# Patient Record
Sex: Female | Born: 1970 | Hispanic: No | Marital: Married | Smoking: Never smoker
Health system: Southern US, Community
[De-identification: ages and names within clinical notes are randomized; demographics above are authoritative.]

## PROBLEM LIST (undated history)

## (undated) DIAGNOSIS — S14129A Central cord syndrome at unspecified level of cervical spinal cord, initial encounter: Secondary | ICD-10-CM

## (undated) DIAGNOSIS — Z90710 Acquired absence of both cervix and uterus: Secondary | ICD-10-CM

## (undated) HISTORY — PX: ABDOMINAL HYSTERECTOMY: SHX81

## (undated) HISTORY — DX: Acquired absence of both cervix and uterus: Z90.710

## (undated) HISTORY — PX: TOTAL ABDOMINAL HYSTERECTOMY: SHX209

## (undated) HISTORY — DX: Central cord syndrome at unspecified level of cervical spinal cord, initial encounter: S14.129A

---

## 2000-11-19 ENCOUNTER — Emergency Department (HOSPITAL_COMMUNITY): Admission: EM | Admit: 2000-11-19 | Discharge: 2000-11-19 | Payer: Self-pay | Admitting: Emergency Medicine

## 2000-11-19 ENCOUNTER — Encounter: Payer: Self-pay | Admitting: Emergency Medicine

## 2003-03-13 ENCOUNTER — Emergency Department (HOSPITAL_COMMUNITY): Admission: AD | Admit: 2003-03-13 | Discharge: 2003-03-13 | Payer: Self-pay | Admitting: Family Medicine

## 2006-10-04 ENCOUNTER — Emergency Department (HOSPITAL_COMMUNITY): Admission: EM | Admit: 2006-10-04 | Discharge: 2006-10-04 | Payer: Self-pay | Admitting: Emergency Medicine

## 2010-12-15 LAB — URINALYSIS, ROUTINE W REFLEX MICROSCOPIC
Bilirubin Urine: NEGATIVE
Glucose, UA: NEGATIVE
Ketones, ur: NEGATIVE
Leukocytes, UA: NEGATIVE
Nitrite: NEGATIVE
Protein, ur: NEGATIVE
Specific Gravity, Urine: <1.005 — ABNORMAL LOW
Urobilinogen, UA: 0.2
pH: 6

## 2010-12-15 LAB — DIFFERENTIAL
Basophils Absolute: 0
Basophils Relative: 0
Eosinophils Absolute: 0.1
Eosinophils Relative: 2
Lymphocytes Relative: 32
Lymphs Abs: 2.1
Monocytes Absolute: 0.6
Monocytes Relative: 9
Neutro Abs: 3.8
Neutrophils Relative %: 57

## 2010-12-15 LAB — PREGNANCY, URINE: Preg Test, Ur: NEGATIVE

## 2010-12-15 LAB — URINE MICROSCOPIC-ADD ON

## 2010-12-15 LAB — CBC
HCT: 34 — ABNORMAL LOW
Hemoglobin: 12.1
MCHC: 35.7
MCV: 90.6
Platelets: 250
RBC: 3.75 — ABNORMAL LOW
RDW: 12.3
WBC: 6.7

## 2010-12-15 LAB — BASIC METABOLIC PANEL
BUN: 6
CO2: 25
Calcium: 9.2
Chloride: 106
Creatinine, Ser: 0.59
GFR calc Af Amer: >60
GFR calc non Af Amer: >60
Glucose, Bld: 95
Potassium: 3.6
Sodium: 138

## 2019-03-03 HISTORY — PX: OTHER SURGICAL HISTORY: SHX169

## 2019-08-03 ENCOUNTER — Emergency Department (HOSPITAL_COMMUNITY): Payer: Medicaid Other

## 2019-08-03 ENCOUNTER — Inpatient Hospital Stay (HOSPITAL_COMMUNITY)
Admission: EM | Admit: 2019-08-03 | Discharge: 2019-08-07 | DRG: 957 | Disposition: A | Discharge status: Rehabilitation Facility | Payer: Medicaid Other | Attending: General Surgery | Admitting: General Surgery

## 2019-08-03 ENCOUNTER — Inpatient Hospital Stay (HOSPITAL_COMMUNITY): Payer: Medicaid Other

## 2019-08-03 ENCOUNTER — Encounter (HOSPITAL_COMMUNITY): Payer: Self-pay

## 2019-08-03 DIAGNOSIS — S0102XA Laceration with foreign body of scalp, initial encounter: Secondary | ICD-10-CM | POA: Diagnosis present

## 2019-08-03 DIAGNOSIS — D696 Thrombocytopenia, unspecified: Secondary | ICD-10-CM | POA: Diagnosis present

## 2019-08-03 DIAGNOSIS — S14129D Central cord syndrome at unspecified level of cervical spinal cord, subsequent encounter: Secondary | ICD-10-CM | POA: Diagnosis not present

## 2019-08-03 DIAGNOSIS — S22079A Unspecified fracture of T9-T10 vertebra, initial encounter for closed fracture: Secondary | ICD-10-CM | POA: Diagnosis not present

## 2019-08-03 DIAGNOSIS — M542 Cervicalgia: Secondary | ICD-10-CM | POA: Diagnosis not present

## 2019-08-03 DIAGNOSIS — M79642 Pain in left hand: Secondary | ICD-10-CM | POA: Diagnosis not present

## 2019-08-03 DIAGNOSIS — Y92410 Unspecified street and highway as the place of occurrence of the external cause: Secondary | ICD-10-CM | POA: Diagnosis not present

## 2019-08-03 DIAGNOSIS — S271XXA Traumatic hemothorax, initial encounter: Secondary | ICD-10-CM | POA: Diagnosis not present

## 2019-08-03 DIAGNOSIS — S22009A Unspecified fracture of unspecified thoracic vertebra, initial encounter for closed fracture: Secondary | ICD-10-CM | POA: Diagnosis present

## 2019-08-03 DIAGNOSIS — M4317 Spondylolisthesis, lumbosacral region: Secondary | ICD-10-CM | POA: Diagnosis not present

## 2019-08-03 DIAGNOSIS — R578 Other shock: Secondary | ICD-10-CM | POA: Diagnosis not present

## 2019-08-03 DIAGNOSIS — M79601 Pain in right arm: Secondary | ICD-10-CM | POA: Diagnosis present

## 2019-08-03 DIAGNOSIS — Z833 Family history of diabetes mellitus: Secondary | ICD-10-CM | POA: Diagnosis not present

## 2019-08-03 DIAGNOSIS — S080XXA Avulsion of scalp, initial encounter: Secondary | ICD-10-CM | POA: Diagnosis not present

## 2019-08-03 DIAGNOSIS — K592 Neurogenic bowel, not elsewhere classified: Secondary | ICD-10-CM | POA: Diagnosis not present

## 2019-08-03 DIAGNOSIS — M79602 Pain in left arm: Secondary | ICD-10-CM | POA: Diagnosis present

## 2019-08-03 DIAGNOSIS — S14129S Central cord syndrome at unspecified level of cervical spinal cord, sequela: Secondary | ICD-10-CM | POA: Diagnosis not present

## 2019-08-03 DIAGNOSIS — R52 Pain, unspecified: Secondary | ICD-10-CM | POA: Diagnosis not present

## 2019-08-03 DIAGNOSIS — S14129A Central cord syndrome at unspecified level of cervical spinal cord, initial encounter: Secondary | ICD-10-CM | POA: Diagnosis not present

## 2019-08-03 DIAGNOSIS — D62 Acute posthemorrhagic anemia: Secondary | ICD-10-CM | POA: Diagnosis not present

## 2019-08-03 DIAGNOSIS — B373 Candidiasis of vulva and vagina: Secondary | ICD-10-CM | POA: Diagnosis not present

## 2019-08-03 DIAGNOSIS — S22039A Unspecified fracture of third thoracic vertebra, initial encounter for closed fracture: Principal | ICD-10-CM | POA: Diagnosis present

## 2019-08-03 DIAGNOSIS — R339 Retention of urine, unspecified: Secondary | ICD-10-CM | POA: Diagnosis present

## 2019-08-03 DIAGNOSIS — S22030D Wedge compression fracture of third thoracic vertebra, subsequent encounter for fracture with routine healing: Secondary | ICD-10-CM | POA: Diagnosis present

## 2019-08-03 DIAGNOSIS — R40242 Glasgow coma scale score 9-12, unspecified time: Secondary | ICD-10-CM | POA: Diagnosis present

## 2019-08-03 DIAGNOSIS — S134XXA Sprain of ligaments of cervical spine, initial encounter: Secondary | ICD-10-CM | POA: Diagnosis present

## 2019-08-03 DIAGNOSIS — I959 Hypotension, unspecified: Secondary | ICD-10-CM | POA: Diagnosis not present

## 2019-08-03 DIAGNOSIS — M25539 Pain in unspecified wrist: Secondary | ICD-10-CM

## 2019-08-03 DIAGNOSIS — S2220XA Unspecified fracture of sternum, initial encounter for closed fracture: Secondary | ICD-10-CM | POA: Diagnosis not present

## 2019-08-03 DIAGNOSIS — J9690 Respiratory failure, unspecified, unspecified whether with hypoxia or hypercapnia: Secondary | ICD-10-CM

## 2019-08-03 DIAGNOSIS — S27322A Contusion of lung, bilateral, initial encounter: Secondary | ICD-10-CM | POA: Diagnosis present

## 2019-08-03 DIAGNOSIS — S1980XA Other specified injuries of unspecified part of neck, initial encounter: Secondary | ICD-10-CM

## 2019-08-03 DIAGNOSIS — E876 Hypokalemia: Secondary | ICD-10-CM | POA: Diagnosis not present

## 2019-08-03 DIAGNOSIS — Z20822 Contact with and (suspected) exposure to covid-19: Secondary | ICD-10-CM | POA: Diagnosis not present

## 2019-08-03 DIAGNOSIS — Z23 Encounter for immunization: Secondary | ICD-10-CM

## 2019-08-03 DIAGNOSIS — N319 Neuromuscular dysfunction of bladder, unspecified: Secondary | ICD-10-CM | POA: Diagnosis not present

## 2019-08-03 DIAGNOSIS — S272XXA Traumatic hemopneumothorax, initial encounter: Secondary | ICD-10-CM | POA: Diagnosis present

## 2019-08-03 DIAGNOSIS — S020XXA Fracture of vault of skull, initial encounter for closed fracture: Secondary | ICD-10-CM | POA: Diagnosis not present

## 2019-08-03 DIAGNOSIS — R2689 Other abnormalities of gait and mobility: Secondary | ICD-10-CM | POA: Diagnosis not present

## 2019-08-03 DIAGNOSIS — S0101XS Laceration without foreign body of scalp, sequela: Secondary | ICD-10-CM | POA: Diagnosis not present

## 2019-08-03 DIAGNOSIS — R404 Transient alteration of awareness: Secondary | ICD-10-CM | POA: Diagnosis not present

## 2019-08-03 LAB — URINALYSIS, ROUTINE W REFLEX MICROSCOPIC
Bacteria, UA: NONE SEEN
Bilirubin Urine: NEGATIVE
Glucose, UA: NEGATIVE mg/dL
Ketones, ur: NEGATIVE mg/dL
Leukocytes,Ua: NEGATIVE
Nitrite: NEGATIVE
Protein, ur: 30 mg/dL — AB
Specific Gravity, Urine: 1.039 — ABNORMAL HIGH (ref 1.005–1.030)
pH: 6 (ref 5.0–8.0)

## 2019-08-03 LAB — COMPREHENSIVE METABOLIC PANEL
ALT: 151 U/L — ABNORMAL HIGH (ref 0–44)
AST: 183 U/L — ABNORMAL HIGH (ref 15–41)
Albumin: 3.8 g/dL (ref 3.5–5.0)
Alkaline Phosphatase: 62 U/L (ref 38–126)
Anion gap: 12 (ref 5–15)
BUN: 13 mg/dL (ref 6–20)
CO2: 22 mmol/L (ref 22–32)
Calcium: 9 mg/dL (ref 8.9–10.3)
Chloride: 104 mmol/L (ref 98–111)
Creatinine, Ser: 1.03 mg/dL — ABNORMAL HIGH (ref 0.44–1.00)
GFR calc Af Amer: >60 mL/min (ref >60)
GFR calc non Af Amer: >60 mL/min (ref >60)
Glucose, Bld: 168 mg/dL — ABNORMAL HIGH (ref 70–99)
Potassium: 3.6 mmol/L (ref 3.5–5.1)
Sodium: 138 mmol/L (ref 135–145)
Total Bilirubin: 0.9 mg/dL (ref 0.3–1.2)
Total Protein: 6.3 g/dL — ABNORMAL LOW (ref 6.5–8.1)

## 2019-08-03 LAB — CBC
HCT: 45.2 % (ref 36.0–46.0)
Hemoglobin: 15.3 g/dL — ABNORMAL HIGH (ref 12.0–15.0)
MCH: 31.2 pg (ref 26.0–34.0)
MCHC: 33.8 g/dL (ref 30.0–36.0)
MCV: 92.2 fL (ref 80.0–100.0)
Platelets: 241 10*3/uL (ref 150–400)
RBC: 4.9 MIL/uL (ref 3.87–5.11)
RDW: 12.3 % (ref 11.5–15.5)
WBC: 17 10*3/uL — ABNORMAL HIGH (ref 4.0–10.5)
nRBC: 0 % (ref 0.0–0.2)

## 2019-08-03 LAB — PREPARE FRESH FROZEN PLASMA
Unit division: 0
Unit division: 0

## 2019-08-03 LAB — I-STAT CHEM 8, ED
BUN: 14 mg/dL (ref 6–20)
Calcium, Ion: 1.01 mmol/L — ABNORMAL LOW (ref 1.15–1.40)
Chloride: 104 mmol/L (ref 98–111)
Creatinine, Ser: 1.1 mg/dL — ABNORMAL HIGH (ref 0.44–1.00)
Glucose, Bld: 170 mg/dL — ABNORMAL HIGH (ref 70–99)
HCT: 44 % (ref 36.0–46.0)
Hemoglobin: 15 g/dL (ref 12.0–15.0)
Potassium: 3.3 mmol/L — ABNORMAL LOW (ref 3.5–5.1)
Sodium: 139 mmol/L (ref 135–145)
TCO2: 24 mmol/L (ref 22–32)

## 2019-08-03 LAB — RAPID URINE DRUG SCREEN, HOSP PERFORMED
Amphetamines: NOT DETECTED
Barbiturates: NOT DETECTED
Benzodiazepines: NOT DETECTED
Cocaine: NOT DETECTED
Opiates: NOT DETECTED
Tetrahydrocannabinol: NOT DETECTED

## 2019-08-03 LAB — HIV ANTIBODY (ROUTINE TESTING W REFLEX): HIV Screen 4th Generation wRfx: NONREACTIVE

## 2019-08-03 LAB — ABO/RH: ABO/RH(D): B POS

## 2019-08-03 LAB — LACTIC ACID, PLASMA: Lactic Acid, Venous: 3.6 mmol/L (ref 0.5–1.9)

## 2019-08-03 LAB — TRAUMA TEG PANEL
CFF Max Amplitude: 18.5 mm (ref 15–32)
Citrated Kaolin (R): 3.4 min — ABNORMAL LOW (ref 4.6–9.1)
Citrated Rapid TEG (MA): 59.9 mm (ref 52–70)
Lysis at 30 Minutes: 0 % (ref 0.0–2.6)

## 2019-08-03 LAB — PROTIME-INR
INR: 1.1 (ref 0.8–1.2)
Prothrombin Time: 13.9 seconds (ref 11.4–15.2)

## 2019-08-03 LAB — BPAM FFP
Blood Product Expiration Date: 202106042359
Blood Product Expiration Date: 202106042359
ISSUE DATE / TIME: 202106030610
ISSUE DATE / TIME: 202106030610
Unit Type and Rh: 2800
Unit Type and Rh: 8400

## 2019-08-03 LAB — SARS CORONAVIRUS 2 BY RT PCR (HOSPITAL ORDER, PERFORMED IN ~~LOC~~ HOSPITAL LAB): SARS Coronavirus 2: NEGATIVE

## 2019-08-03 LAB — ETHANOL: Alcohol, Ethyl (B): <10 mg/dL (ref <10)

## 2019-08-03 LAB — MRSA PCR SCREENING: MRSA by PCR: NEGATIVE

## 2019-08-03 MED ORDER — IOHEXOL 300 MG/ML  SOLN
100.0000 mL | Freq: Once | INTRAMUSCULAR | Status: AC | PRN
  Administered 2019-08-03: 100 mL via INTRAVENOUS

## 2019-08-03 MED ORDER — OXYCODONE HCL 5 MG PO TABS
5.0000 mg | ORAL_TABLET | ORAL | Status: DC | PRN
  Administered 2019-08-03 – 2019-08-07 (×12): 10 mg via ORAL
  Filled 2019-08-03 (×13): qty 2

## 2019-08-03 MED ORDER — DOCUSATE SODIUM 100 MG PO CAPS
100.0000 mg | ORAL_CAPSULE | Freq: Two times a day (BID) | ORAL | Status: DC
  Administered 2019-08-03 – 2019-08-07 (×7): 100 mg via ORAL
  Filled 2019-08-03 (×7): qty 1

## 2019-08-03 MED ORDER — MORPHINE SULFATE (PF) 4 MG/ML IV SOLN: 4.0000 mg | INTRAVENOUS | Status: DC | PRN

## 2019-08-03 MED ORDER — FENTANYL CITRATE (PF) 100 MCG/2ML IJ SOLN
INTRAMUSCULAR | Status: AC | PRN
  Administered 2019-08-03: 25 ug via INTRAVENOUS

## 2019-08-03 MED ORDER — FENTANYL CITRATE (PF) 100 MCG/2ML IJ SOLN
INTRAMUSCULAR | Status: AC
  Filled 2019-08-03: qty 2

## 2019-08-03 MED ORDER — ONDANSETRON HCL 4 MG/2ML IJ SOLN: 4.0000 mg | Freq: Four times a day (QID) | INTRAMUSCULAR | Status: DC | PRN

## 2019-08-03 MED ORDER — LIDOCAINE-EPINEPHRINE (PF) 2 %-1:200000 IJ SOLN
INTRAMUSCULAR | Status: AC
  Filled 2019-08-03: qty 20

## 2019-08-03 MED ORDER — CEFAZOLIN SODIUM-DEXTROSE 2-4 GM/100ML-% IV SOLN
2.0000 g | Freq: Once | INTRAVENOUS | Status: AC
  Administered 2019-08-03: 2 g via INTRAVENOUS
  Filled 2019-08-03: qty 100

## 2019-08-03 MED ORDER — FENTANYL CITRATE (PF) 100 MCG/2ML IJ SOLN
50.0000 ug | Freq: Once | INTRAMUSCULAR | Status: AC
  Administered 2019-08-03: 50 ug via INTRAVENOUS

## 2019-08-03 MED ORDER — SODIUM CHLORIDE 0.9 % IV BOLUS
1000.0000 mL | Freq: Once | INTRAVENOUS | Status: AC
  Administered 2019-08-03: 1000 mL via INTRAVENOUS

## 2019-08-03 MED ORDER — ENOXAPARIN SODIUM 30 MG/0.3ML ~~LOC~~ SOLN
30.0000 mg | Freq: Two times a day (BID) | SUBCUTANEOUS | Status: DC
  Administered 2019-08-03 – 2019-08-07 (×8): 30 mg via SUBCUTANEOUS
  Filled 2019-08-03 (×8): qty 0.3

## 2019-08-03 MED ORDER — ACETAMINOPHEN 500 MG PO TABS
1000.0000 mg | ORAL_TABLET | Freq: Four times a day (QID) | ORAL | Status: DC
  Administered 2019-08-03 – 2019-08-07 (×14): 1000 mg via ORAL
  Filled 2019-08-03 (×15): qty 2

## 2019-08-03 MED ORDER — IOHEXOL 300 MG/ML  SOLN: 100.0000 mL | Freq: Once | INTRAMUSCULAR | Status: DC | PRN

## 2019-08-03 MED ORDER — FENTANYL CITRATE (PF) 100 MCG/2ML IJ SOLN
INTRAMUSCULAR | Status: AC
  Administered 2019-08-03: 25 ug via INTRAVENOUS
  Filled 2019-08-03: qty 2

## 2019-08-03 MED ORDER — LACTATED RINGERS IV SOLN: INTRAVENOUS | Status: DC

## 2019-08-03 MED ORDER — FENTANYL CITRATE (PF) 100 MCG/2ML IJ SOLN: 25.0000 ug | INTRAMUSCULAR | Status: DC | PRN

## 2019-08-03 MED ORDER — ONDANSETRON 4 MG PO TBDP: 4.0000 mg | ORAL_TABLET | Freq: Four times a day (QID) | ORAL | Status: DC | PRN

## 2019-08-03 MED ORDER — TETANUS-DIPHTH-ACELL PERTUSSIS 5-2.5-18.5 LF-MCG/0.5 IM SUSP
0.5000 mL | Freq: Once | INTRAMUSCULAR | Status: AC
  Administered 2019-08-03: 0.5 mL via INTRAMUSCULAR
  Filled 2019-08-03: qty 0.5

## 2019-08-03 NOTE — Progress Notes (Signed)
Orthopedic Tech Progress Note Patient Details:  Shelly Houston Jan 12, 1971 [id number]  Called [employer] and ordered medium SOMI brace.  Gerald Stabs 08/03/2019, 2:23 PM
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
## 2019-08-03 NOTE — ED Notes (Signed)
Transported to xray 
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
## 2019-08-03 NOTE — Progress Notes (Signed)
CT c-spine reviewed and negative for fracture. Clinical exam performed to evaluate for ligamentous injury. C-spine evaluation performed. No midline pain, but +TTP. Exam aborted and flex-ex films performed notable for widening at C4/5. MRI c-spine ordered.   Diamantina Monks, MD General and Trauma Surgery [facility]
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
## 2019-08-03 NOTE — Progress Notes (Signed)
Orthopedic Tech Progress Note Patient Details:  Shelly Houston October 13, 1970 [id number]  Ortho Devices Type of Ortho Device: Volar splint Ortho Device/Splint Location: RUE Ortho Device/Splint Interventions: Application   Post Interventions Patient Tolerated: Well   Fletcher Rathbun E Edynn Gillock 08/03/2019, 7:11 AM

## 2019-08-04 ENCOUNTER — Inpatient Hospital Stay (HOSPITAL_COMMUNITY): Payer: Medicaid Other

## 2019-08-04 LAB — BASIC METABOLIC PANEL
Anion gap: 7 (ref 5–15)
BUN: 21 mg/dL — ABNORMAL HIGH (ref 6–20)
CO2: 25 mmol/L (ref 22–32)
Calcium: 8.5 mg/dL — ABNORMAL LOW (ref 8.9–10.3)
Chloride: 109 mmol/L (ref 98–111)
Creatinine, Ser: 0.72 mg/dL (ref 0.44–1.00)
GFR calc Af Amer: >60 mL/min (ref >60)
GFR calc non Af Amer: >60 mL/min (ref >60)
Glucose, Bld: 122 mg/dL — ABNORMAL HIGH (ref 70–99)
Potassium: 3.5 mmol/L (ref 3.5–5.1)
Sodium: 141 mmol/L (ref 135–145)

## 2019-08-04 LAB — CBC
HCT: 34.6 % — ABNORMAL LOW (ref 36.0–46.0)
Hemoglobin: 12.3 g/dL (ref 12.0–15.0)
MCH: 31.3 pg (ref 26.0–34.0)
MCHC: 35.5 g/dL (ref 30.0–36.0)
MCV: 88 fL (ref 80.0–100.0)
Platelets: 162 10*3/uL (ref 150–400)
RBC: 3.93 MIL/uL (ref 3.87–5.11)
RDW: 13.3 % (ref 11.5–15.5)
WBC: 11.4 10*3/uL — ABNORMAL HIGH (ref 4.0–10.5)
nRBC: 0 % (ref 0.0–0.2)

## 2019-08-04 LAB — TYPE AND SCREEN
ABO/RH(D): B POS
Antibody Screen: NEGATIVE
Unit division: 0
Unit division: 0
Unit division: 0
Unit division: 0

## 2019-08-04 LAB — BPAM RBC
Blood Product Expiration Date: 202106192359
Blood Product Expiration Date: 202106192359
Blood Product Expiration Date: 202107082359
Blood Product Expiration Date: 202107082359
ISSUE DATE / TIME: 202106030605
ISSUE DATE / TIME: 202106030608
ISSUE DATE / TIME: 202106030608
ISSUE DATE / TIME: 202106030615
Unit Type and Rh: 5100
Unit Type and Rh: 5100
Unit Type and Rh: 9500
Unit Type and Rh: 9500

## 2019-08-04 LAB — BLOOD PRODUCT ORDER (VERBAL) VERIFICATION

## 2019-08-04 MED ORDER — SODIUM CHLORIDE 0.9 % IV BOLUS
500.0000 mL | Freq: Once | INTRAVENOUS | Status: AC
  Administered 2019-08-04: 500 mL via INTRAVENOUS

## 2019-08-04 MED ORDER — LACTATED RINGERS IV BOLUS
1000.0000 mL | Freq: Once | INTRAVENOUS | Status: AC
  Administered 2019-08-04: 1000 mL via INTRAVENOUS

## 2019-08-04 MED ORDER — KETOROLAC TROMETHAMINE 15 MG/ML IJ SOLN
30.0000 mg | Freq: Four times a day (QID) | INTRAMUSCULAR | Status: DC
  Administered 2019-08-04 – 2019-08-07 (×13): 30 mg via INTRAVENOUS
  Filled 2019-08-04 (×14): qty 2

## 2019-08-04 MED ORDER — CHLORHEXIDINE GLUCONATE CLOTH 2 % EX PADS
6.0000 | MEDICATED_PAD | Freq: Every day | CUTANEOUS | Status: DC
  Administered 2019-08-04 – 2019-08-07 (×4): 6 via TOPICAL

## 2019-08-04 MED ORDER — METHOCARBAMOL 500 MG PO TABS
1000.0000 mg | ORAL_TABLET | Freq: Three times a day (TID) | ORAL | Status: DC
  Administered 2019-08-04 – 2019-08-07 (×10): 1000 mg via ORAL
  Filled 2019-08-04 (×10): qty 2
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
## 2019-08-04 NOTE — TOC CAGE-AID Note (Signed)
Transition of Care [facility]) - CAGE-AID Screening   Patient Details  Name: Shelly Houston MRN: [id number] Date of Birth: 1970-04-03  [facility] CM/SW Contact:    Jimmy Picket, LCSWA Phone Number: 08/04/2019, 11:28 AM   Clinical Narrative:  Pt was unable to participate in assessment due to not being oriented.   CAGE-AID Screening: Substance Abuse Screening unable to be completed due to: : Patient unable to participate                    Isabella Stalling Clinical Social Worker [phone]

## 2019-08-04 NOTE — Progress Notes (Signed)
Bosie Clos is daughter and Erie Noe is the niece. Provider on call gave orders to to flush foley and if little return then bladder scan. Flushed with 20 ml and 20 ml came out. Bladder scan showed 0. MD placed orders to give bolus. Will continue to monitor. Mayford Knife RN
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
## 2019-08-04 NOTE — Progress Notes (Signed)
Orthopedic Tech Progress Note Patient Details:  Shelly Houston June 04, 1970 [id number] PER PA he wanted a VELCRO THUMB SPICA  Ortho Devices Type of Ortho Device: Thumb velcro splint Ortho Device/Splint Location: LUE Ortho Device/Splint Interventions: Ordered, Application   Post Interventions Patient Tolerated: Well Instructions Provided: Care of device   Shelly Houston 08/04/2019, 10:51 AM

## 2019-08-05 ENCOUNTER — Inpatient Hospital Stay (HOSPITAL_COMMUNITY): Payer: Medicaid Other

## 2019-08-05 LAB — URINALYSIS, COMPLETE (UACMP) WITH MICROSCOPIC
Bacteria, UA: NONE SEEN
Glucose, UA: NEGATIVE mg/dL
Hgb urine dipstick: NEGATIVE
Ketones, ur: NEGATIVE mg/dL
Leukocytes,Ua: NEGATIVE
Nitrite: NEGATIVE
Protein, ur: 30 mg/dL — AB
Specific Gravity, Urine: 1.029 (ref 1.005–1.030)
pH: 6 (ref 5.0–8.0)

## 2019-08-05 LAB — CBC
HCT: 31.2 % — ABNORMAL LOW (ref 36.0–46.0)
Hemoglobin: 10.9 g/dL — ABNORMAL LOW (ref 12.0–15.0)
MCH: 31.1 pg (ref 26.0–34.0)
MCHC: 34.9 g/dL (ref 30.0–36.0)
MCV: 89.1 fL (ref 80.0–100.0)
Platelets: 159 10*3/uL (ref 150–400)
RBC: 3.5 MIL/uL — ABNORMAL LOW (ref 3.87–5.11)
RDW: 13.2 % (ref 11.5–15.5)
WBC: 9.8 10*3/uL (ref 4.0–10.5)
nRBC: 0 % (ref 0.0–0.2)

## 2019-08-05 LAB — CDS SEROLOGY

## 2019-08-05 LAB — BASIC METABOLIC PANEL
Anion gap: 8 (ref 5–15)
BUN: 14 mg/dL (ref 6–20)
CO2: 25 mmol/L (ref 22–32)
Calcium: 8.3 mg/dL — ABNORMAL LOW (ref 8.9–10.3)
Chloride: 107 mmol/L (ref 98–111)
Creatinine, Ser: 0.6 mg/dL (ref 0.44–1.00)
GFR calc Af Amer: >60 mL/min (ref >60)
GFR calc non Af Amer: >60 mL/min (ref >60)
Glucose, Bld: 103 mg/dL — ABNORMAL HIGH (ref 70–99)
Potassium: 3.4 mmol/L — ABNORMAL LOW (ref 3.5–5.1)
Sodium: 140 mmol/L (ref 135–145)

## 2019-08-05 MED ORDER — GABAPENTIN 100 MG PO CAPS
100.0000 mg | ORAL_CAPSULE | Freq: Three times a day (TID) | ORAL | Status: DC
  Administered 2019-08-05 – 2019-08-07 (×8): 100 mg via ORAL
  Filled 2019-08-05 (×8): qty 1

## 2019-08-05 NOTE — Progress Notes (Signed)
NEUROSURGERY PROGRESS NOTE  Doing well. Complains of appropriate back pain. Lying in bed with brace comfortably.    Temp:  [98.3 F (36.8 C)-101 F (38.3 C)] 99.6 F (37.6 C) (06/05 0717) Pulse Rate:  [80-96] 80 (06/05 0717) Resp:  [8-20] 11 (06/05 0717) BP: (120-142)/(59-76) 125/70 (06/05 0717) SpO2:  [94 %-98 %] 98 % (06/05 0717)  Plan: No new recommendations. Continue bracing in hopes that these fractures will heal with time.   Sherryl Manges, NP 08/05/2019 9:45 AM
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
## 2019-08-05 NOTE — Progress Notes (Signed)
Inpatient Rehab Admissions Coordinator Note:   Per PT/OT recommendations, pt was screened for CIR candidacy by Wolfgang Phoenix, MS, CCC-SLP.  At this time we are recommending an Inpatient Rehab consult.  AC will place consult order per protocol.  Please contact me with questions.    Wolfgang Phoenix, MS, CCC-SLP Admissions Coordinator [phone] 08/05/19 5:26 PM
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
## 2019-08-05 NOTE — Progress Notes (Signed)
Spoke with Ortho Tech in regards to the poor fitting brace the patient is wearing. Ortho Tech is contacting the manufacturer to have them come in to improve the fit for the patient.
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
## 2019-08-05 NOTE — Progress Notes (Signed)
Orthopedic Tech Progress Note Patient Details:  Shelly Houston 10/28/1970 [id number]  Called [employer] to have SOMI brace refitted. I was told by the nurse that the braces support does not seem effective and that it is causing brusing.   Gerald Stabs 08/05/2019, 12:12 PM

## 2019-08-06 LAB — CBC
HCT: 28.8 % — ABNORMAL LOW (ref 36.0–46.0)
Hemoglobin: 9.8 g/dL — ABNORMAL LOW (ref 12.0–15.0)
MCH: 30.4 pg (ref 26.0–34.0)
MCHC: 34 g/dL (ref 30.0–36.0)
MCV: 89.4 fL (ref 80.0–100.0)
Platelets: 125 10*3/uL — ABNORMAL LOW (ref 150–400)
RBC: 3.22 MIL/uL — ABNORMAL LOW (ref 3.87–5.11)
RDW: 12.9 % (ref 11.5–15.5)
WBC: 9.5 10*3/uL (ref 4.0–10.5)
nRBC: 0 % (ref 0.0–0.2)

## 2019-08-06 LAB — BASIC METABOLIC PANEL
Anion gap: 10 (ref 5–15)
BUN: 10 mg/dL (ref 6–20)
CO2: 24 mmol/L (ref 22–32)
Calcium: 8.1 mg/dL — ABNORMAL LOW (ref 8.9–10.3)
Chloride: 106 mmol/L (ref 98–111)
Creatinine, Ser: 0.54 mg/dL (ref 0.44–1.00)
GFR calc Af Amer: >60 mL/min (ref >60)
GFR calc non Af Amer: >60 mL/min (ref >60)
Glucose, Bld: 101 mg/dL — ABNORMAL HIGH (ref 70–99)
Potassium: 3.4 mmol/L — ABNORMAL LOW (ref 3.5–5.1)
Sodium: 140 mmol/L (ref 135–145)

## 2019-08-06 LAB — URINE CULTURE: Culture: NO GROWTH
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
## 2019-08-07 ENCOUNTER — Encounter (HOSPITAL_COMMUNITY): Payer: Self-pay

## 2019-08-07 ENCOUNTER — Inpatient Hospital Stay (HOSPITAL_COMMUNITY)
Admission: RE | Admit: 2019-08-07 | Discharge: 2019-08-29 | DRG: 092 | Disposition: A | Discharge status: Home / Self Care | Payer: Medicaid Other | Source: Intra-hospital | Attending: Physical Medicine and Rehabilitation | Admitting: Physical Medicine and Rehabilitation

## 2019-08-07 DIAGNOSIS — M7542 Impingement syndrome of left shoulder: Secondary | ICD-10-CM | POA: Diagnosis present

## 2019-08-07 DIAGNOSIS — S14129D Central cord syndrome at unspecified level of cervical spinal cord, subsequent encounter: Secondary | ICD-10-CM

## 2019-08-07 DIAGNOSIS — D62 Acute posthemorrhagic anemia: Secondary | ICD-10-CM | POA: Diagnosis present

## 2019-08-07 DIAGNOSIS — S0101XS Laceration without foreign body of scalp, sequela: Secondary | ICD-10-CM | POA: Diagnosis not present

## 2019-08-07 DIAGNOSIS — S27321D Contusion of lung, unilateral, subsequent encounter: Secondary | ICD-10-CM

## 2019-08-07 DIAGNOSIS — K5900 Constipation, unspecified: Secondary | ICD-10-CM | POA: Diagnosis present

## 2019-08-07 DIAGNOSIS — S2220XD Unspecified fracture of sternum, subsequent encounter for fracture with routine healing: Secondary | ICD-10-CM | POA: Diagnosis not present

## 2019-08-07 DIAGNOSIS — M549 Dorsalgia, unspecified: Secondary | ICD-10-CM | POA: Diagnosis present

## 2019-08-07 DIAGNOSIS — S22039D Unspecified fracture of third thoracic vertebra, subsequent encounter for fracture with routine healing: Secondary | ICD-10-CM | POA: Diagnosis not present

## 2019-08-07 DIAGNOSIS — M25531 Pain in right wrist: Secondary | ICD-10-CM | POA: Diagnosis not present

## 2019-08-07 DIAGNOSIS — K592 Neurogenic bowel, not elsewhere classified: Secondary | ICD-10-CM | POA: Diagnosis not present

## 2019-08-07 DIAGNOSIS — S0101XD Laceration without foreign body of scalp, subsequent encounter: Secondary | ICD-10-CM

## 2019-08-07 DIAGNOSIS — S62001D Unspecified fracture of navicular [scaphoid] bone of right wrist, subsequent encounter for fracture with routine healing: Secondary | ICD-10-CM | POA: Diagnosis not present

## 2019-08-07 DIAGNOSIS — G629 Polyneuropathy, unspecified: Secondary | ICD-10-CM

## 2019-08-07 DIAGNOSIS — L24A9 Irritant contact dermatitis due friction or contact with other specified body fluids: Secondary | ICD-10-CM

## 2019-08-07 DIAGNOSIS — R001 Bradycardia, unspecified: Secondary | ICD-10-CM | POA: Diagnosis not present

## 2019-08-07 DIAGNOSIS — N390 Urinary tract infection, site not specified: Secondary | ICD-10-CM | POA: Diagnosis not present

## 2019-08-07 DIAGNOSIS — R2689 Other abnormalities of gait and mobility: Secondary | ICD-10-CM | POA: Diagnosis present

## 2019-08-07 DIAGNOSIS — E876 Hypokalemia: Secondary | ICD-10-CM | POA: Diagnosis not present

## 2019-08-07 DIAGNOSIS — R4701 Aphasia: Secondary | ICD-10-CM | POA: Diagnosis present

## 2019-08-07 DIAGNOSIS — R42 Dizziness and giddiness: Secondary | ICD-10-CM | POA: Diagnosis not present

## 2019-08-07 DIAGNOSIS — B9620 Unspecified Escherichia coli [E. coli] as the cause of diseases classified elsewhere: Secondary | ICD-10-CM | POA: Diagnosis not present

## 2019-08-07 DIAGNOSIS — M7541 Impingement syndrome of right shoulder: Secondary | ICD-10-CM | POA: Diagnosis present

## 2019-08-07 DIAGNOSIS — D696 Thrombocytopenia, unspecified: Secondary | ICD-10-CM | POA: Diagnosis present

## 2019-08-07 DIAGNOSIS — S2231XD Fracture of one rib, right side, subsequent encounter for fracture with routine healing: Secondary | ICD-10-CM

## 2019-08-07 DIAGNOSIS — S22009A Unspecified fracture of unspecified thoracic vertebra, initial encounter for closed fracture: Secondary | ICD-10-CM | POA: Diagnosis present

## 2019-08-07 DIAGNOSIS — N319 Neuromuscular dysfunction of bladder, unspecified: Secondary | ICD-10-CM | POA: Diagnosis present

## 2019-08-07 DIAGNOSIS — S14129A Central cord syndrome at unspecified level of cervical spinal cord, initial encounter: Secondary | ICD-10-CM | POA: Diagnosis present

## 2019-08-07 DIAGNOSIS — S14126D Central cord syndrome at C6 level of cervical spinal cord, subsequent encounter: Secondary | ICD-10-CM

## 2019-08-07 DIAGNOSIS — S22030D Wedge compression fracture of third thoracic vertebra, subsequent encounter for fracture with routine healing: Secondary | ICD-10-CM | POA: Diagnosis present

## 2019-08-07 DIAGNOSIS — M7540 Impingement syndrome of unspecified shoulder: Secondary | ICD-10-CM

## 2019-08-07 DIAGNOSIS — S14129S Central cord syndrome at unspecified level of cervical spinal cord, sequela: Secondary | ICD-10-CM | POA: Diagnosis not present

## 2019-08-07 LAB — CBC
HCT: 28.4 % — ABNORMAL LOW (ref 36.0–46.0)
Hemoglobin: 9.9 g/dL — ABNORMAL LOW (ref 12.0–15.0)
MCH: 31.2 pg (ref 26.0–34.0)
MCHC: 34.9 g/dL (ref 30.0–36.0)
MCV: 89.6 fL (ref 80.0–100.0)
Platelets: 167 10*3/uL (ref 150–400)
RBC: 3.17 MIL/uL — ABNORMAL LOW (ref 3.87–5.11)
RDW: 12.8 % (ref 11.5–15.5)
WBC: 9 10*3/uL (ref 4.0–10.5)
nRBC: 0.3 % — ABNORMAL HIGH (ref 0.0–0.2)

## 2019-08-07 LAB — BASIC METABOLIC PANEL
Anion gap: 8 (ref 5–15)
BUN: 10 mg/dL (ref 6–20)
CO2: 24 mmol/L (ref 22–32)
Calcium: 8.1 mg/dL — ABNORMAL LOW (ref 8.9–10.3)
Chloride: 106 mmol/L (ref 98–111)
Creatinine, Ser: 0.5 mg/dL (ref 0.44–1.00)
GFR calc Af Amer: >60 mL/min (ref >60)
GFR calc non Af Amer: >60 mL/min (ref >60)
Glucose, Bld: 99 mg/dL (ref 70–99)
Potassium: 3.1 mmol/L — ABNORMAL LOW (ref 3.5–5.1)
Sodium: 138 mmol/L (ref 135–145)

## 2019-08-07 MED ORDER — OXYCODONE HCL 5 MG PO TABS
5.0000 mg | ORAL_TABLET | ORAL | Status: DC | PRN
  Administered 2019-08-07 – 2019-08-27 (×16): 10 mg via ORAL
  Filled 2019-08-07 (×18): qty 2

## 2019-08-07 MED ORDER — CHLORHEXIDINE GLUCONATE CLOTH 2 % EX PADS: 6.0000 | MEDICATED_PAD | Freq: Every day | CUTANEOUS | Status: DC

## 2019-08-07 MED ORDER — ACETAMINOPHEN 325 MG PO TABS
325.0000 mg | ORAL_TABLET | ORAL | Status: DC | PRN
  Administered 2019-08-16: 650 mg via ORAL
  Filled 2019-08-07 (×2): qty 2

## 2019-08-07 MED ORDER — BETHANECHOL CHLORIDE 25 MG PO TABS
25.0000 mg | ORAL_TABLET | Freq: Three times a day (TID) | ORAL | Status: DC
  Administered 2019-08-07 – 2019-08-08 (×2): 25 mg via ORAL
  Filled 2019-08-07 (×2): qty 1

## 2019-08-07 MED ORDER — BETHANECHOL CHLORIDE 25 MG PO TABS
25.0000 mg | ORAL_TABLET | Freq: Three times a day (TID) | ORAL | Status: DC
  Administered 2019-08-07: 25 mg via ORAL
  Filled 2019-08-07: qty 1

## 2019-08-07 MED ORDER — LIDOCAINE HCL URETHRAL/MUCOSAL 2 % EX GEL: CUTANEOUS | Status: DC | PRN

## 2019-08-07 MED ORDER — GABAPENTIN 100 MG PO CAPS
100.0000 mg | ORAL_CAPSULE | Freq: Three times a day (TID) | ORAL | Status: DC
  Administered 2019-08-07 – 2019-08-08 (×2): 100 mg via ORAL
  Filled 2019-08-07 (×3): qty 1

## 2019-08-07 MED ORDER — ENOXAPARIN SODIUM 30 MG/0.3ML ~~LOC~~ SOLN
30.0000 mg | Freq: Two times a day (BID) | SUBCUTANEOUS | Status: DC
  Administered 2019-08-07 – 2019-08-29 (×44): 30 mg via SUBCUTANEOUS
  Filled 2019-08-07 (×44): qty 0.3

## 2019-08-07 MED ORDER — DIPHENHYDRAMINE HCL 12.5 MG/5ML PO ELIX: 12.5000 mg | ORAL_SOLUTION | Freq: Four times a day (QID) | ORAL | Status: DC | PRN

## 2019-08-07 MED ORDER — ARTIFICIAL TEARS OPHTHALMIC OINT
TOPICAL_OINTMENT | OPHTHALMIC | Status: DC | PRN
  Filled 2019-08-07: qty 3.5

## 2019-08-07 MED ORDER — PROCHLORPERAZINE EDISYLATE 10 MG/2ML IJ SOLN: 5.0000 mg | Freq: Four times a day (QID) | INTRAMUSCULAR | Status: DC | PRN

## 2019-08-07 MED ORDER — ENOXAPARIN SODIUM 30 MG/0.3ML ~~LOC~~ SOLN: 30.0000 mg | Freq: Two times a day (BID) | SUBCUTANEOUS | Status: DC

## 2019-08-07 MED ORDER — KETOROLAC TROMETHAMINE 15 MG/ML IJ SOLN
30.0000 mg | Freq: Four times a day (QID) | INTRAMUSCULAR | Status: DC
  Administered 2019-08-08 – 2019-08-09 (×6): 30 mg via INTRAVENOUS
  Filled 2019-08-07 (×7): qty 2

## 2019-08-07 MED ORDER — PROCHLORPERAZINE 25 MG RE SUPP
12.5000 mg | Freq: Four times a day (QID) | RECTAL | Status: DC | PRN
Start: 2019-08-07 — End: 2019-08-29

## 2019-08-07 MED ORDER — ACETAMINOPHEN 500 MG PO TABS
1000.0000 mg | ORAL_TABLET | Freq: Four times a day (QID) | ORAL | Status: DC
  Administered 2019-08-08 (×3): 1000 mg via ORAL
  Filled 2019-08-07 (×3): qty 2

## 2019-08-07 MED ORDER — METHOCARBAMOL 500 MG PO TABS
1000.0000 mg | ORAL_TABLET | Freq: Three times a day (TID) | ORAL | Status: DC
  Administered 2019-08-07 – 2019-08-09 (×5): 1000 mg via ORAL
  Filled 2019-08-07 (×5): qty 2

## 2019-08-07 MED ORDER — POLYETHYLENE GLYCOL 3350 17 G PO PACK
17.0000 g | PACK | Freq: Every day | ORAL | Status: DC | PRN
Start: 2019-08-07 — End: 2019-08-08

## 2019-08-07 MED ORDER — ALUM & MAG HYDROXIDE-SIMETH 200-200-20 MG/5ML PO SUSP: 30.0000 mL | ORAL | Status: DC | PRN

## 2019-08-07 MED ORDER — TRAMADOL HCL 50 MG PO TABS: 50.0000 mg | ORAL_TABLET | Freq: Four times a day (QID) | ORAL | Status: DC | PRN

## 2019-08-07 MED ORDER — TRAZODONE HCL 50 MG PO TABS
25.0000 mg | ORAL_TABLET | Freq: Every evening | ORAL | Status: DC | PRN
  Administered 2019-08-14: 50 mg via ORAL
  Filled 2019-08-07: qty 1

## 2019-08-07 MED ORDER — PROCHLORPERAZINE MALEATE 5 MG PO TABS: 5.0000 mg | ORAL_TABLET | Freq: Four times a day (QID) | ORAL | Status: DC | PRN

## 2019-08-07 MED ORDER — FLEET ENEMA 7-19 GM/118ML RE ENEM: 1.0000 | ENEMA | Freq: Once | RECTAL | Status: DC | PRN

## 2019-08-07 MED ORDER — FLUCONAZOLE 200 MG PO TABS
200.0000 mg | ORAL_TABLET | Freq: Once | ORAL | Status: AC
  Administered 2019-08-07: 200 mg via ORAL
  Filled 2019-08-07: qty 1

## 2019-08-07 MED ORDER — GUAIFENESIN-DM 100-10 MG/5ML PO SYRP: 5.0000 mL | ORAL_SOLUTION | Freq: Four times a day (QID) | ORAL | Status: DC | PRN

## 2019-08-07 MED ORDER — BISACODYL 10 MG RE SUPP: 10.0000 mg | Freq: Every day | RECTAL | Status: DC | PRN

## 2019-08-07 MED ORDER — POLYSACCHARIDE IRON COMPLEX 150 MG PO CAPS
150.0000 mg | ORAL_CAPSULE | Freq: Every day | ORAL | Status: DC
  Administered 2019-08-08 – 2019-08-29 (×22): 150 mg via ORAL
  Filled 2019-08-07 (×22): qty 1

## 2019-08-07 NOTE — Progress Notes (Signed)
Subjective: The patient is alert and pleasant.  She is wearing her Minerva brace.  She denies hand numbness but still has weakness.  Her daughter is at the bedside and translates for [place].  Objective: Vital signs in last 24 hours: Temp:  [97.5 F (36.4 C)-100 F (37.8 C)] 98.4 F (36.9 C) (06/07 1525) Pulse Rate:  [79-80] 79 (06/07 1525) Resp:  [12-21] 12 (06/07 1525) BP: (116-146)/(65-74) 146/74 (06/07 1525) SpO2:  [91 %-99 %] 91 % (06/07 1525) Estimated body mass index is 24.96 kg/m as calculated from the following:   Height as of this encounter: 5\' 5"  (1.651 m).   Weight as of this encounter: 68 kg.   Intake/Output from previous day: 06/06 0701 - 06/07 0700 In: 600 [P.O.:600] Out: 2300 [Urine:2300] Intake/Output this shift: Total I/O In: 620 [P.O.:620] Out: 900 [Urine:900]  Physical exam patient is alert and oriented.  She has weakness in her hands.  She is wearing a Minerva brace.  Lab Results: Recent Labs    08/06/19 0342 08/07/19 0659  WBC 9.5 9.0  HGB 9.8* 9.9*  HCT 28.8* 28.4*  PLT 125* 167   BMET Recent Labs    08/06/19 0342 08/07/19 0659  NA 140 138  K 3.4* 3.1*  CL 106 106  CO2 24 24  GLUCOSE 101* 99  BUN 10 10  CREATININE 0.54 0.50  CALCIUM 8.1* 8.1*    Studies/Results: No results found.  Assessment/Plan: T3, T10 fractures: The T10 fracture is minor.  I think she will heal her T3 fracture in her Minerva brace.  Cervical stenosis, ligamentous injury, central cord syndrome: I discussed this with them.  I recommend we plan to readdress this after she recovers in rehab.  I have answered all her questions.  LOS: 4 days     10/07/19 08/07/2019, 5:08 PM
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
## 2019-08-07 NOTE — Progress Notes (Signed)
Patient attempted multiple times to urinate with  small amounts of urine. Bladder scan showed 655 ml and IN/Out cath had 900 ml of urine. Patient appears to have a possible yeast infection and yellowish drainage from left eye and some from the right eye. Patient is tolerating pain regime well at present.
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
## 2019-08-07 NOTE — Progress Notes (Signed)
SLP Cancellation Note  Patient Details Name: Sabine Tenenbaum MRN: [id number] DOB: [birth year]   Cancelled treatment:       Reason Eval/Treat Not Completed: Other (comment) Attempted SLE this am, pt with PT. Given plan to admit to CIR, will defer eval to that level of care.   Deniel Mcquiston, Riley Nearing 08/07/2019, 2:14 PM
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
## 2019-08-07 NOTE — Progress Notes (Signed)
Report called to CIR. All questions answered. Patients medications are up to date. Patients pain is well controled. Daughter is at the bedside.
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
## 2019-08-07 NOTE — Progress Notes (Signed)
Foley place per orders out put 
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
## 2019-08-08 ENCOUNTER — Other Ambulatory Visit: Payer: Self-pay

## 2019-08-08 ENCOUNTER — Inpatient Hospital Stay (HOSPITAL_COMMUNITY): Payer: Self-pay | Admitting: Physical Therapy

## 2019-08-08 ENCOUNTER — Inpatient Hospital Stay (HOSPITAL_COMMUNITY): Payer: Self-pay | Admitting: Occupational Therapy

## 2019-08-08 ENCOUNTER — Encounter (HOSPITAL_COMMUNITY): Payer: Self-pay | Admitting: Physical Medicine and Rehabilitation

## 2019-08-08 ENCOUNTER — Inpatient Hospital Stay (HOSPITAL_COMMUNITY): Payer: Self-pay

## 2019-08-08 DIAGNOSIS — S14129D Central cord syndrome at unspecified level of cervical spinal cord, subsequent encounter: Secondary | ICD-10-CM

## 2019-08-08 LAB — COMPREHENSIVE METABOLIC PANEL
ALT: 42 U/L (ref 0–44)
AST: 35 U/L (ref 15–41)
Albumin: 2.5 g/dL — ABNORMAL LOW (ref 3.5–5.0)
Alkaline Phosphatase: 87 U/L (ref 38–126)
Anion gap: 9 (ref 5–15)
BUN: 8 mg/dL (ref 6–20)
CO2: 24 mmol/L (ref 22–32)
Calcium: 8.3 mg/dL — ABNORMAL LOW (ref 8.9–10.3)
Chloride: 105 mmol/L (ref 98–111)
Creatinine, Ser: 0.47 mg/dL (ref 0.44–1.00)
GFR calc Af Amer: >60 mL/min (ref >60)
GFR calc non Af Amer: >60 mL/min (ref >60)
Glucose, Bld: 104 mg/dL — ABNORMAL HIGH (ref 70–99)
Potassium: 2.8 mmol/L — ABNORMAL LOW (ref 3.5–5.1)
Sodium: 138 mmol/L (ref 135–145)
Total Bilirubin: 1 mg/dL (ref 0.3–1.2)
Total Protein: 6 g/dL — ABNORMAL LOW (ref 6.5–8.1)

## 2019-08-08 LAB — CBC WITH DIFFERENTIAL/PLATELET
Abs Immature Granulocytes: 0.22 10*3/uL — ABNORMAL HIGH (ref 0.00–0.07)
Basophils Absolute: 0 10*3/uL (ref 0.0–0.1)
Basophils Relative: 0 %
Eosinophils Absolute: 0.1 10*3/uL (ref 0.0–0.5)
Eosinophils Relative: 2 %
HCT: 29.7 % — ABNORMAL LOW (ref 36.0–46.0)
Hemoglobin: 10.1 g/dL — ABNORMAL LOW (ref 12.0–15.0)
Immature Granulocytes: 3 %
Lymphocytes Relative: 15 %
Lymphs Abs: 1.2 10*3/uL (ref 0.7–4.0)
MCH: 30.5 pg (ref 26.0–34.0)
MCHC: 34 g/dL (ref 30.0–36.0)
MCV: 89.7 fL (ref 80.0–100.0)
Monocytes Absolute: 0.7 10*3/uL (ref 0.1–1.0)
Monocytes Relative: 8 %
Neutro Abs: 5.8 10*3/uL (ref 1.7–7.7)
Neutrophils Relative %: 72 %
Platelets: 198 10*3/uL (ref 150–400)
RBC: 3.31 MIL/uL — ABNORMAL LOW (ref 3.87–5.11)
RDW: 12.9 % (ref 11.5–15.5)
WBC: 8.1 10*3/uL (ref 4.0–10.5)
nRBC: 0.6 % — ABNORMAL HIGH (ref 0.0–0.2)

## 2019-08-08 MED ORDER — FLEET ENEMA 7-19 GM/118ML RE ENEM
1.0000 | ENEMA | Freq: Once | RECTAL | Status: DC
  Filled 2019-08-08: qty 1

## 2019-08-08 MED ORDER — CHLORHEXIDINE GLUCONATE CLOTH 2 % EX PADS
6.0000 | MEDICATED_PAD | Freq: Two times a day (BID) | CUTANEOUS | Status: DC
  Administered 2019-08-09 – 2019-08-11 (×5): 6 via TOPICAL

## 2019-08-08 MED ORDER — MAGNESIUM CITRATE PO SOLN
1.0000 | Freq: Once | ORAL | Status: AC
  Administered 2019-08-08: 1 via ORAL
  Filled 2019-08-08 (×2): qty 296

## 2019-08-08 MED ORDER — TRAMADOL HCL 50 MG PO TABS
50.0000 mg | ORAL_TABLET | Freq: Three times a day (TID) | ORAL | Status: DC
  Administered 2019-08-08 – 2019-08-09 (×3): 50 mg via ORAL
  Filled 2019-08-08 (×2): qty 1

## 2019-08-08 MED ORDER — GABAPENTIN 100 MG PO CAPS
200.0000 mg | ORAL_CAPSULE | Freq: Three times a day (TID) | ORAL | Status: DC
  Administered 2019-08-08 – 2019-08-17 (×27): 200 mg via ORAL
  Filled 2019-08-08 (×26): qty 2

## 2019-08-08 MED ORDER — TAMSULOSIN HCL 0.4 MG PO CAPS
0.4000 mg | ORAL_CAPSULE | Freq: Every day | ORAL | Status: DC
  Administered 2019-08-08 – 2019-08-28 (×21): 0.4 mg via ORAL
  Filled 2019-08-08 (×21): qty 1

## 2019-08-08 MED ORDER — POLYETHYLENE GLYCOL 3350 17 G PO PACK
17.0000 g | PACK | Freq: Every day | ORAL | Status: DC
  Administered 2019-08-08 – 2019-08-29 (×22): 17 g via ORAL
  Filled 2019-08-08 (×21): qty 1

## 2019-08-08 MED ORDER — POTASSIUM CHLORIDE CRYS ER 20 MEQ PO TBCR
40.0000 meq | EXTENDED_RELEASE_TABLET | Freq: Three times a day (TID) | ORAL | Status: AC
  Administered 2019-08-08 (×3): 40 meq via ORAL
  Filled 2019-08-08 (×3): qty 2

## 2019-08-08 MED ORDER — FLEET ENEMA 7-19 GM/118ML RE ENEM: 1.0000 | ENEMA | Freq: Every day | RECTAL | Status: DC | PRN

## 2019-08-08 MED ORDER — ACETAMINOPHEN 325 MG PO TABS
650.0000 mg | ORAL_TABLET | Freq: Four times a day (QID) | ORAL | Status: DC
  Administered 2019-08-08 – 2019-08-09 (×3): 650 mg via ORAL
  Filled 2019-08-08 (×3): qty 2
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
## 2019-08-08 NOTE — Progress Notes (Signed)
Inpatient Rehabilitation Medication Review by a Pharmacist  A complete drug regimen review was completed for this patient to identify any potential clinically significant medication issues.  Clinically significant medication issues were identified:  no     Name of provider notified for urgent issues identified: n/a  Provider Method of Notification: n/a   For non-urgent medication issues to be resolved on team rounds tomorrow morning a [facility] Secure Chat Handoff was sent to:    Pharmacist comments:   Time spent performing this drug regimen review (minutes):  10   Camauri Fleece A. Jeanella Craze, PharmD, BCPS, FNKF Clinical Pharmacist [place] Please utilize Amion for appropriate phone number to reach the unit pharmacist [facility])  08/08/2019 2:18 PM

## 2019-08-08 NOTE — Progress Notes (Signed)
Physical Therapy Session Note  Patient Details  Name: Shelly Houston MRN: [id number] Date of Birth: 1970-03-24  Today's Date: 08/08/2019 PT Individual Time: 1130-1145 PT Individual Time Calculation (min): 15 min   Short Term Goals: Week 1:   See PT Eval  Skilled Therapeutic Interventions/Progress Updates:    Pt received semi-reclined in bed, agreeable to PT session. No complaints of pain and rest and pt reports no pain with mobility this session. Pt is max A to roll onto R side. Sidelying to sitting EOB with mod A for trunk elevation. Sit to stand with min A x 2. Pt unsteady upon initially standing but no LE buckling noted. Stand pivot transfer bed to recliner with min HHA x 2 (WB through elbow on LUE) with increased time and cues needed to complete transfer safely. Pt declines further activity at this time due to fatigue. Pt left seated in recliner in room with needs in reach, family present at end of session. Interpreter present during therapy session. Pt missed 15 min of therapy session due to fatigue.  Therapy Documentation Precautions:  Restrictions Weight Bearing Restrictions: Yes LUE Weight Bearing: Non weight bearing General: PT Amount of Missed Time (min): 15 Minutes PT Missed Treatment Reason: Patient fatigue    Therapy/Group: Individual Therapy   Peter Congo, PT, DPT  08/08/2019, 12:29 PM

## 2019-08-08 NOTE — Plan of Care (Signed)
  Problem: Consults Goal: RH GENERAL PATIENT EDUCATION Description: See Patient Education module for education specifics. Outcome: Progressing Goal: Skin Care Protocol Initiated - if Braden Score 18 or less Description: If consults are not indicated, leave blank or document N/A Outcome: Progressing Goal: Nutrition Consult-if indicated Outcome: Progressing Goal: Diabetes Guidelines if Diabetic/Glucose > 140 Description: If diabetic or lab glucose is > 140 mg/dl - Initiate Diabetes/Hyperglycemia Guidelines & Document Interventions  Outcome: Progressing   Problem: RH BOWEL ELIMINATION Goal: RH STG MANAGE BOWEL WITH ASSISTANCE Description: STG Manage Bowel with Assistance. Outcome: Progressing Goal: RH STG MANAGE BOWEL W/MEDICATION W/ASSISTANCE Description: STG Manage Bowel with Medication with Assistance. Outcome: Progressing   Problem: RH BLADDER ELIMINATION Goal: RH STG MANAGE BLADDER WITH ASSISTANCE Description: STG Manage Bladder With Assistance Outcome: Progressing Goal: RH STG MANAGE BLADDER WITH MEDICATION WITH ASSISTANCE Description: STG Manage Bladder With Medication With Assistance. Outcome: Progressing Goal: RH STG MANAGE BLADDER WITH EQUIPMENT WITH ASSISTANCE Description: STG Manage Bladder With Equipment With Assistance Outcome: Progressing   Problem: RH SKIN INTEGRITY Goal: RH STG SKIN FREE OF INFECTION/BREAKDOWN Outcome: Progressing Goal: RH STG MAINTAIN SKIN INTEGRITY WITH ASSISTANCE Description: STG Maintain Skin Integrity With Assistance. Outcome: Progressing Goal: RH STG ABLE TO PERFORM INCISION/WOUND CARE W/ASSISTANCE Description: STG Able To Perform Incision/Wound Care With Assistance. Outcome: Progressing   Problem: RH SAFETY Goal: RH STG ADHERE TO SAFETY PRECAUTIONS W/ASSISTANCE/DEVICE Description: STG Adhere to Safety Precautions With Assistance/Device. Outcome: Progressing Goal: RH STG DECREASED RISK OF FALL WITH ASSISTANCE Description: STG  Decreased Risk of Fall With Assistance. Outcome: Progressing   Problem: RH PAIN MANAGEMENT Goal: RH STG PAIN MANAGED AT OR BELOW PT'S PAIN GOAL Outcome: Progressing   Problem: RH KNOWLEDGE DEFICIT GENERAL Goal: RH STG INCREASE KNOWLEDGE OF SELF CARE AFTER HOSPITALIZATION Outcome: Progressing   

## 2019-08-08 NOTE — Progress Notes (Signed)
Patient ID: Shelly Houston, female   DOB: 1971-02-06, 49 y.o.   MRN: [id number]   Sw met with pt and pt daughter [name] and niece [name] to provide updates from team conference, and ELOS 2-3 weeks. SW informed there will continue to be updates.   Loralee Pacas, MSW, [place] Office: [phone] Cell: [phone] Fax: [phone]
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
## 2019-08-08 NOTE — Progress Notes (Signed)
Inpatient Rehabilitation  Patient information reviewed and entered into eRehab system by Isys Tietje M. Keyri Salberg, M.A., CCC/SLP, PPS Coordinator.  Information including medical coding, functional ability and quality indicators will be reviewed and updated through discharge.    
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
## 2019-08-08 NOTE — Progress Notes (Signed)
Inpatient Rehabilitation Care Coordinator Assessment and Plan  Patient Details  Name: Shelly Houston MRN: [id number] Date of Birth: 09/03/1970  Today's Date: 08/08/2019  Problem List:  Patient Active Problem List   Diagnosis Date Noted  . Central cord syndrome ([place]) 08/07/2019  . Thoracic spine fracture ([place]) 08/03/2019   Past Medical History: History reviewed. No pertinent past medical history. Past Surgical History: History reviewed. No pertinent surgical history. Social History:  reports that she has never smoked. She has never used smokeless tobacco. She reports previous alcohol use. She reports that she does not use drugs.  Family / Support Systems Marital Status: Married How Long?: 23 years Patient Roles: Spouse, Parent Spouse/Significant Other: Eddie Dibbles [phone]) Children: 3 adult children- [name] and [name] (daughters); Milinda Hirschfeld (son) Other Supports: older sister and niece [name] Anticipated Caregiver: Older sister and children Ability/Limitations of Caregiver: None reported Caregiver Availability: 24/7 Family Dynamics: Pt lives with her husband and children  Social History Preferred language: English Religion: Unknown Education: high school graduate Read: Yes Write: Yes Employment Status: Employed Public relations account executive Issues: Denies Guardian/Conservator: N/A   Abuse/Neglect Abuse/Neglect Assessment Can Be Completed: Yes Physical Abuse: Denies Verbal Abuse: Denies Sexual Abuse: Denies Exploitation of patient/patient's resources: Denies Self-Neglect: Denies  Emotional Status Pt's affect, behavior and adjustment status: Pt in good spirits during visit Recent Psychosocial Issues: Denies Psychiatric History: Denies Substance Abuse History: Denies  Patient / Family Perceptions, Expectations & Goals Pt/Family understanding of illness & functional limitations: Pt and pt family have a general understanding of pt care needs Premorbid pt/family  roles/activities: Independent Anticipated changes in roles/activities/participation: Assistance with ADLs/IADLs  Education officer, environmental Agencies: None Premorbid Home Care/DME Agencies: None Transportation available at discharge: Family to transport Resource referrals recommended: Neuropsychology  Discharge Planning Living Arrangements: Spouse/significant other, Children Support Systems: Spouse/significant other, Children Type of Residence: Private residence Insurance Resources: Teacher, adult education Resources: Employment, Secondary school teacher Screen Referred: No(requested) Living Expenses: Rent Money Management: Patient, Spouse Does the patient have any problems obtaining your medications?: No Care Coordinator Barriers to Discharge: Other (comments) Care Coordinator Barriers to Discharge Comments: Uninsured Care Coordinator Anticipated Follow Up Needs: HH/OP  Clinical Impression SW met with pt in room to introduce self, explain role, and discuss discharge process. Spanish Interpreter Sonia arrived while in meeting with pt, as well as, daughter [name] and Niece [name]. Family confirms pt will have support from various family members when she returns home. Pt is not a English as a second language teacher. No HCPOA. No DME. SW discussed there will be follow-up about ELOS once all assessments have been completed.   SW spoke with [place] about pt Medicaid application. Reports pt was not screened and will send referral to financial navigators who will screen to see if pt is appropriate.   Gurnoor Sloop A Haizel Gatchell 08/08/2019, 10:51 AM
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
## 2019-08-08 NOTE — Progress Notes (Signed)
Orthopedic Tech Progress Note Patient Details:  Shelly Houston 06/21/1970 [id number] Called in order to [facility] for a CTO BRACE Patient ID: Shelly Houston, female   DOB: 1970/07/24, 49 y.o.   MRN: [id number]   Donald Pore 08/08/2019, 4:27 PM

## 2019-08-09 ENCOUNTER — Inpatient Hospital Stay (HOSPITAL_COMMUNITY): Payer: Self-pay

## 2019-08-09 LAB — URINALYSIS, ROUTINE W REFLEX MICROSCOPIC
Bilirubin Urine: NEGATIVE
Glucose, UA: NEGATIVE mg/dL
Ketones, ur: NEGATIVE mg/dL
Nitrite: NEGATIVE
Protein, ur: 30 mg/dL — AB
Specific Gravity, Urine: 1.012 (ref 1.005–1.030)
WBC, UA: >50 WBC/hpf — ABNORMAL HIGH (ref 0–5)
pH: 7 (ref 5.0–8.0)

## 2019-08-09 LAB — BASIC METABOLIC PANEL
Anion gap: 7 (ref 5–15)
BUN: 8 mg/dL (ref 6–20)
CO2: 24 mmol/L (ref 22–32)
Calcium: 8.4 mg/dL — ABNORMAL LOW (ref 8.9–10.3)
Chloride: 107 mmol/L (ref 98–111)
Creatinine, Ser: 0.43 mg/dL — ABNORMAL LOW (ref 0.44–1.00)
GFR calc Af Amer: >60 mL/min (ref >60)
GFR calc non Af Amer: >60 mL/min (ref >60)
Glucose, Bld: 110 mg/dL — ABNORMAL HIGH (ref 70–99)
Potassium: 3.9 mmol/L (ref 3.5–5.1)
Sodium: 138 mmol/L (ref 135–145)

## 2019-08-09 MED ORDER — KETOROLAC TROMETHAMINE 10 MG PO TABS
10.0000 mg | ORAL_TABLET | Freq: Four times a day (QID) | ORAL | Status: DC
  Filled 2019-08-09: qty 1

## 2019-08-09 MED ORDER — CYCLOBENZAPRINE HCL 5 MG PO TABS
5.0000 mg | ORAL_TABLET | Freq: Three times a day (TID) | ORAL | Status: DC
  Administered 2019-08-09 – 2019-08-17 (×25): 5 mg via ORAL
  Filled 2019-08-09 (×25): qty 1

## 2019-08-09 MED ORDER — TRAMADOL-ACETAMINOPHEN 37.5-325 MG PO TABS
2.0000 | ORAL_TABLET | Freq: Three times a day (TID) | ORAL | Status: DC
  Administered 2019-08-09 – 2019-08-29 (×80): 2 via ORAL
  Filled 2019-08-09 (×80): qty 2

## 2019-08-09 MED ORDER — OXYCODONE HCL 5 MG PO TABS
10.0000 mg | ORAL_TABLET | Freq: Two times a day (BID) | ORAL | Status: DC
  Administered 2019-08-09 – 2019-08-17 (×17): 10 mg via ORAL
  Filled 2019-08-09 (×15): qty 2
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
## 2019-08-09 NOTE — Progress Notes (Signed)
Patient arrived to unit with daughter, A&Ox4 no complaints of pain, oriented to room, unit, fall and visitor policy.  **Late Entry**

## 2019-08-09 NOTE — Patient Care Conference (Signed)
Inpatient RehabilitationTeam Conference and Plan of Care Update Date: 08/09/2019   Time: 9:21 AM    Patient Name: Shelly Houston      Medical Record Number: [id number]  Date of Birth: 1970/04/01 Sex: Female         Room/Bed: 4M06C/4M06C-01 Payor Info: Payor: /    Admit Date/Time:  08/07/2019  6:42 PM  Primary Diagnosis:  Central cord syndrome [facility])  Patient Active Problem List   Diagnosis Date Noted  . Central cord syndrome (HCC) 08/07/2019  . Thoracic spine fracture (HCC) 08/03/2019    Expected Discharge Date: Expected Discharge Date: (3 weeks)  Team Members Present: Physician leading conference: Dr. Genice Rouge Care Coodinator Present: Cecile Sheerer, LCSWA;Other (comment)(Zehra Rucci Marlyne Beards, RN, BSN, CRRN) Nurse Present: Reuben Likes, LPN PT Present: Malachi Pro, PT OT Present: Towanda Malkin, OT SLP Present: Colin Benton, SLP PPS Coordinator present : Fae Pippin, SLP     Current Status/Progress Goal Weekly Team Focus  Bowel/Bladder   foley in place with no documented bowel movement  to remain continent without any assistance  timed toileting q 2 hrs daily   Swallow/Nutrition/ Hydration             ADL's   dependent self care, mod A rolling in bed  mod A self care  bed mobility, UB adl and grooming, eating/assistive devices, balance, SPT, family education   Mobility             Communication             Safety/Cognition/ Behavioral Observations            Pain   states current pain between 6-8  to maintain pain level lower than 4  assess pain q 4 hrs and prn   Skin   Abrasions, bruising, Laceration to head  maintain skin intergrity and no new breakdown while on rehab  assess skin q shift and prn    Rehab Goals Patient on target to meet rehab goals: Yes *See Care Plan and progress notes for long and short-term goals.     Barriers to Discharge  Current Status/Progress Possible Resolutions Date Resolved   Nursing  Decreased caregiver  support;Incontinence;Wound Care;Lack of/limited family support  Language barrier            PT  Home environment access/layout;Weight bearing restrictions                 OT                  SLP                Care Coordinator Other (comments) Uninsured            Discharge Planning/Teaching Needs:  D/c to home with support from various family members  Family education as recommneded by therapy   Team Discussion:  Continue foley and foley care until dc'd by MD. Dependent for self care. Pt is mod assist with pain as primary complaint in right elbow and shoulder. Leg strength is good. Pt has 5 STE, family is available as pt will need 24/7 supervision as recommended by therapy.  Revisions to Treatment Plan:  none    Medical Summary Current Status: foam on bottom- no skin breakdown; came and adjusted brace- more comfortable Weekly Focus/Goal: roll min-mod A; weak grasp R hand- otherwise total A;- LUE limited  Barriers to Discharge: Decreased family/caregiver support;Home enviroment access/layout;Incontinence;Neurogenic Bowel & Bladder;Medical stability;Weight;Weight bearing restrictions;Wound care;Other (comments)  Barriers to Discharge  Comments: SLP{- do screen; Hypokalemia- 2.8 and nerve pain- speaks spanish- needs 24/7 A;- ~3 weeks Possible Resolutions to Barriers: PT- mod A bed; sit-stand- couple steps- pain in R elbow and shoulder- nerve? and head pain; sleepy after pain meds legs strong;   Continued Need for Acute Rehabilitation Level of Care: The patient requires daily medical management by a physician with specialized training in physical medicine and rehabilitation for the following reasons: Direction of a multidisciplinary physical rehabilitation program to maximize functional independence : Yes Medical management of patient stability for increased activity during participation in an intensive rehabilitation regime.: Yes Analysis of laboratory values and/or radiology reports with  any subsequent need for medication adjustment and/or medical intervention. : Yes   I attest that I was present, lead the team conference, and concur with the assessment and plan of the team.   Cristi Loron 08/09/2019, 9:21 AM

## 2019-08-09 NOTE — Progress Notes (Addendum)
Head wound evaluated per nurse and family--patient reported to have hematoma on posterior scalp and dried blood noted on donut used for pressure relief from cervical portion of CTO.   Patient up in chair with OT-- reporting significant neck pain--MS in nature which improved when placed in supine position.  She had not had any oxycodone since night before. Will premedicated prior to therapy--scheduled 10 mg oxycodone 7:30 am and 12:30 pm. Will change robaxin to flexeril tid.   On exam, scalp wound with bogginess under stapled laceration with bloody as well as dry scabbed drainage--tender to touch and strong odor in the room. Also had large BM per OT. Unable to see area until braid was undone--wound cleansed and OT will continue to work on cleansing scalp/hair. Patient willing to have surrounding hair clipped to help monitor wound. Will add keflex for wound prophylaxis.  Marland Kitchen

## 2019-08-09 NOTE — Progress Notes (Signed)
Physical Therapy Session Note  Patient Details  Name: Shelly Houston MRN: [id number] Date of Birth: 04/08/1970  Today's Date: 08/09/2019 PT Individual Time: 1400-1444 PT Individual Time Calculation (min): 44 min  and Today's Date: 08/09/2019 PT Missed Time: 16 Minutes Missed Time Reason: Pain  Short Term Goals: Week 1:  PT Short Term Goal 1 (Week 1): Pt will perform bed mobility with minA. PT Short Term Goal 2 (Week 1): Pt will perform sit to stand transfer with minA. PT Short Term Goal 3 (Week 1): Pt will perform stand pivot transfer with minA. PT Short Term Goal 4 (Week 1): Pt will ambulate 31' with minA.  Skilled Therapeutic Interventions/Progress Updates:   Received pt supine in bed with family and interpreter present at bedside. Pt reported pain 7/10 underneath chin and reported "pinching" sensation. PA and RN notified and present during session. Pt declined any OOB mobility due to high pain levels and fatigue but was agreeable to let therapist assist adjusting cervical collar and provide HEP. Session with emphasis on functional mobility/transfers, repositioning, HEP education, and improved activity tolerance. Noted pt's head wound still bleeding and therapist replaced soiled towel with clean one on donut pillow. Adjusted cervical collar while in bed but pt reported no relief. Pt transferred supine<>sitting EOB with HOB elevated and max A for trunk control and therapist loosened cervical collar. Pt reported relief and requested to lie back down; sit<>supine max A. Pt required mod A +2 to reposition in bed and extensive time spent repositioning for comfort using pillows and bed features. Therapist provided and educated pt on HEP consisting of the following exercises: -Standing Tandem Balance with Counter Support - 1 x daily - 7 x weekly - 10 reps - 3 sets -Standing Single Leg Stance with Counter Support - 1 x daily - 7 x weekly - 10 reps - 3 sets -Heel rises with counter support - 1 x daily  - 7 x weekly - 10 reps - 3 sets -Mini Squat with Counter Support - 1 x daily - 7 x weekly - 10 reps - 3 sets -Standing Hip Abduction with Counter Support - 1 x daily - 7 x weekly - 10 reps - 3 sets PA present to assess head wound, adjust cervical collar, and doff soiled dressings. PA recommending that cervical collar company return to assess brace for pt comfort. Concluded session with pt supine in bed, needs within reach, and bed alarm on. RN present at bedside administering pain medication. 16 minutes missed of skilled physical therapy due to pain.   Therapy Documentation Precautions:  Precautions Precautions: Fall, Back, Cervical Precaution Comments: educated on wear ccollar required at all times Required Braces or Orthoses: Other Brace(L thumb spica) Other Brace: Minerva brace with C-collar Restrictions Weight Bearing Restrictions: Yes LUE Weight Bearing: Weight bear through elbow only  Therapy/Group: Individual Therapy Martin Majestic PT, DPT   08/09/2019, 7:23 AM

## 2019-08-09 NOTE — Progress Notes (Signed)
Physical Therapy Session Note  Patient Details  Name: Shelly Houston MRN: [id number] Date of Birth: 1970-11-20  Today's Date: 08/09/2019 PT Individual Time: 0802-0859 PT Individual Time Calculation (min): 57 min   Short Term Goals: Week 1:  PT Short Term Goal 1 (Week 1): Pt will perform bed mobility with minA. PT Short Term Goal 2 (Week 1): Pt will perform sit to stand transfer with minA. PT Short Term Goal 3 (Week 1): Pt will perform stand pivot transfer with minA. PT Short Term Goal 4 (Week 1): Pt will ambulate 45' with minA.  Skilled Therapeutic Interventions/Progress Updates:     Pt received supine in bed and agreeable to therapy. Reports pain in neck, 7/10 in severity. PT provides cues for breathing and rest breaks as needd during session to manage pain symptoms. Pt performs logrolling to R with minA and supine to sit with modA. Extended time sitting at EOB to adjust to upright positioning. Pt complaining of increased discomfort in neck. PT educates on effects of positioning on spine and possibility of discomfort. Sit to stand with minA and stand step transfer to [facility] with modA. WC transport to therapy gym for time management. Pt reports extreme discomfort in WC so WC with increased back support retrieved for pt use. Pt performs stand step transfer to mat with modA. Pt then performs multiple bouts of ambulation, x10', x50', and x100' with +2 WC follow. Pt demos poor trunk control and loses balance laterally several times, requiring modA for safety, but otherwise ambulates with minA manual support at hips and facilitation of weight shifting and extension. Pt tendency to lose balance greater during turns. Pt left seated in WC with all needs within reach and alarm intact.  Therapy Documentation Precautions:  Precautions Precautions: Fall, Back, Cervical Precaution Comments: educated on wear ccollar required at all times Required Braces or Orthoses: Other Brace(L thumb spica) Other Brace:  Minerva brace with C-collar Restrictions Weight Bearing Restrictions: Yes LUE Weight Bearing: Weight bear through elbow only   Therapy/Group: Individual Therapy  Beau Fanny, PT, DPT 08/09/2019, 3:18 PM

## 2019-08-09 NOTE — Progress Notes (Signed)
Orthopedic Tech Progress Note Patient Details:  Shelly Houston 1970-04-21 [id number] Called to [employer] to have someone to come out and adjust patient brace Patient ID: [name], female   DOB: 23-Jun-1970, 49 y.o.   MRN: [id number]   Donald Pore 08/09/2019, 4:06 PM

## 2019-08-09 NOTE — Progress Notes (Signed)
Occupational Therapy Session Note  Patient Details  Name: Shelly Houston MRN: [id number] Date of Birth: [birth year]  Today's Date: 08/09/2019 OT Individual Time: 0900-1015 OT Individual Time Calculation (min): 75 min    Short Term Goals: Week 1:  OT Short Term Goal 1 (Week 1): patient will complete rolling in bed with min A OT Short Term Goal 2 (Week 1): patient will complete sit to stand with min A of one OT Short Term Goal 3 (Week 1): patient will complete eating and oral care tasks wtih mod A using assistive devices and UE support OT Short Term Goal 4 (Week 1): patient will direct brace management with mod cues OT Short Term Goal 5 (Week 1): patient will increase bilateral UE proximal AROM by 25 % for increased use in functional tasks and support for mobility  Skilled Therapeutic Interventions/Progress Updates:    1.1. pt, interpreter and son present for session. Pt reporting large BM in brief unable to wait till OT arrival. Pt with large loose stool in brief that dripped down Les. PT stands 6x during session ~2-3 min each with MIN A for power up and MOD A for balance while OT completes peri care. Pt reporting need to void again and completes ambulatory transfer with MOD A overall to [facility] over toilet. Pt unsuccessful. OT completes more peri care seated on BSC and standing as stated above. Total A to don new brief, pants and gown under brace. PA arrives to assess head wound. Pt with significnat dried blood in hair PA requesting OT to clean. Of note significant odor coming from head wound. Exited session with pt returned to bed, exit alarm on and soft touch call light in reach  Therapy Documentation Precautions:  Precautions Precautions: Fall, Back, Cervical Precaution Comments: educated on wear ccollar required at all times Required Braces or Orthoses: Other Brace(L thumb spica) Other Brace: Minerva brace with C-collar Restrictions Weight Bearing Restrictions: Yes LUE Weight Bearing:  Weight bear through elbow only General:   Vital Signs:   Pain: Pain Assessment Pain Scale: 0-10 Pain Score: 7  ADL: ADL Eating: Dependent Where Assessed-Eating: Bed level Grooming: Dependent Where Assessed-Grooming: Bed level Upper Body Bathing: Dependent Where Assessed-Upper Body Bathing: Bed level Lower Body Bathing: Dependent Where Assessed-Lower Body Bathing: Bed level Upper Body Dressing: Dependent Where Assessed-Upper Body Dressing: Bed level Toileting: Dependent Vision   Perception    Praxis   Exercises:   Other Treatments:     Therapy/Group: Individual Therapy  Shon Hale 08/09/2019, 11:06 AM

## 2019-08-10 ENCOUNTER — Inpatient Hospital Stay (HOSPITAL_COMMUNITY): Payer: Self-pay

## 2019-08-10 ENCOUNTER — Inpatient Hospital Stay (HOSPITAL_COMMUNITY): Payer: Self-pay | Admitting: Occupational Therapy

## 2019-08-10 LAB — CULTURE, BLOOD (ROUTINE X 2)
Culture: NO GROWTH
Culture: NO GROWTH
Special Requests: ADEQUATE
Special Requests: ADEQUATE

## 2019-08-10 MED ORDER — BISACODYL 10 MG RE SUPP
10.0000 mg | Freq: Every day | RECTAL | Status: DC
  Administered 2019-08-10 – 2019-08-28 (×18): 10 mg via RECTAL
  Filled 2019-08-10 (×20): qty 1

## 2019-08-10 NOTE — IPOC Note (Signed)
Overall Plan of Care [facility]) Patient Details Name: Shelly Houston MRN: [id number] DOB: [birth year]  Admitting Diagnosis: Central cord syndrome [facility])  Hospital Problems: Principal Problem:   Central cord syndrome [facility])     Functional Problem List: Nursing Pain, Safety, Skin Integrity  PT Balance, Endurance, Motor, Pain  OT Balance, Endurance, Motor, Pain, Skin Integrity  SLP    TR         Basic ADL's: OT Eating, Grooming, Bathing, Dressing, Toileting     Advanced  ADL's: OT       Transfers: PT Bed Mobility, Bed to Chair, Car  OT Toilet     Locomotion: PT Ambulation, Stairs     Additional Impairments: OT Fuctional Use of Upper Extremity  SLP        TR      Anticipated Outcomes Item Anticipated Outcome  Self Feeding min A  Swallowing      Basic self-care  mod A  Toileting  mod A   Bathroom Transfers CGA  Bowel/Bladder  min assist with bowel and bladder  Transfers  Supervision  Locomotion  Supervision  Communication     Cognition     Pain  Low pain score and utilization of PRN pain meds if needed  Safety/Judgment  Remain free of falls and/or further injury.   Therapy Plan: PT Intensity: Minimum of 1-2 x/day ,45 to 90 minutes PT Frequency: 5 out of 7 days PT Duration Estimated Length of Stay: 3 weeks OT Intensity: Minimum of 1-2 x/day, 45 to 90 minutes OT Frequency: 5 out of 7 days OT Duration/Estimated Length of Stay: 3 weeks     Due to the current state of emergency, patients may not be receiving their 3-hours of Medicare-mandated therapy.   Team Interventions: Nursing Interventions Patient/Family Education, Pain Management, Medication Management, Skin Care/Wound Management  PT interventions Ambulation/gait training, Community reintegration, DME/adaptive equipment instruction, Neuromuscular re-education, Psychosocial support, Stair training, UE/LE Strength taining/ROM, Warden/ranger, Discharge planning, Functional electrical  stimulation, Pain management, Skin care/wound management, Therapeutic Activities, UE/LE Coordination activities, Cognitive remediation/compensation, Functional mobility training, Disease management/prevention, Patient/family education, Splinting/orthotics, Therapeutic Exercise, Visual/perceptual remediation/compensation  OT Interventions Balance/vestibular training, Neuromuscular re-education, Self Care/advanced ADL retraining, Wheelchair propulsion/positioning, DME/adaptive equipment instruction, Pain management, Skin care/wound managment, UE/LE Strength taining/ROM, Community reintegration, Equities trader education, Splinting/orthotics, UE/LE Coordination activities, Discharge planning, Functional mobility training, Psychosocial support, Therapeutic Activities  SLP Interventions    TR Interventions    SW/CM Interventions Discharge Planning, Psychosocial Support, Patient/Family Education   Barriers to Discharge MD  Medical stability, Home enviroment access/loayout, Incontinence, Neurogenic bowel and bladder and Wound care  Nursing Decreased caregiver support, Incontinence, Wound Care, Lack of/limited family support Language barrier  PT Home environment access/layout, Weight bearing restrictions    OT      SLP      SW Other (comments) Uninsured   Team Discharge Planning: Destination: PT-Home ,OT- Home , SLP-  Projected Follow-up: PT-Home health PT, 24 hour supervision/assistance, OT-  Home health OT, SLP-  Projected Equipment Needs: PT-To be determined, OT- 3 in 1 bedside comode, SLP-  Equipment Details: PT- , OT-  Patient/family involved in discharge planning: PT- Patient, Family Adult nurse,  OT-Patient, Family member/caregiver, SLP-   MD ELOS: ~ 3 weeks Medical Rehab Prognosis:  Good Assessment: Pt is a 49 yr old female with central cord syndrome, neurogenic bowel and bladder with current foley to be removed tomorrow- with 2 scalp lacerations- posterior one still oozing  significantly.   Goals CGA- mod A due to weakness  in UEs    See Team Conference Notes for weekly updates to the plan of care

## 2019-08-10 NOTE — Progress Notes (Signed)
Occupational Therapy Session Note  Patient Details  Name: Shelly Houston MRN: [id number] Date of Birth: 17-Oct-1970  Today's Date: 08/10/2019 OT Individual Time: 0800-0900 OT Individual Time Calculation (min): 60 min    Short Term Goals: Week 1:  OT Short Term Goal 1 (Week 1): patient will complete rolling in bed with min A OT Short Term Goal 2 (Week 1): patient will complete sit to stand with min A of one OT Short Term Goal 3 (Week 1): patient will complete eating and oral care tasks wtih mod A using assistive devices and UE support OT Short Term Goal 4 (Week 1): patient will direct brace management with mod cues OT Short Term Goal 5 (Week 1): patient will increase bilateral UE proximal AROM by 25 % for increased use in functional tasks and support for mobility  Skilled Therapeutic Interventions/Progress Updates:    Patient in bed, nursing completing wound care.  Son present for session.  Patient alert and oriented.  She states pain is in right elbow, shoulders and neck - improved with light ROM and repositioning.  Completed UB bathing bed level - she is unable to hold wash cloth or reach with [facility] - dependent overall.  Donned hospital gown under brace.   Supine to sitting edge of bed with mod A.  Sit to stand and SPT to bed side commode with mod A.  Dependent to complete clothing mangaement. Tolerated sitting on commode to complete LB bathing and dressing - requires total assist to complete LB bathing, pants, slipper socks and incontinence brief donning.  SPT mod A back to bed.  Max A for sitting to supine position and repositioning.  Bed alarm set and call bell in reach.    Therapy Documentation Precautions:  Precautions Precautions: Fall, Back, Cervical Precaution Comments: educated on wear ccollar required at all times Required Braces or Orthoses: Other Brace (L thumb spica) Other Brace: Minerva brace with C-collar Restrictions Weight Bearing Restrictions: Yes LUE Weight Bearing:  Weight bear through elbow only   Therapy/Group: Individual Therapy  Barrie Lyme 08/10/2019, 7:40 AM

## 2019-08-10 NOTE — Progress Notes (Signed)
Per report from NT, pt had BM had large type 6 BM on 6/8. Pt was thoroughly cleaned, however had brown discharge when peri/foley care was performed. NT performed peri/foley care again on 6/9 at 2120, and brown discharge was still present. On-call provider was notified and RN received verbal orders to obtain an urinalysis and urine culture. Previous order for urinalysis and culture was placed on 6/7, however was never obtained. RN obtained said urinalysis and culture. Vitals w/in normal parameters, pt's only c/o atm was pain due to scalp injury.

## 2019-08-10 NOTE — Plan of Care (Signed)
  Problem: Consults Goal: RH GENERAL PATIENT EDUCATION Description: See Patient Education module for education specifics. Outcome: Progressing Goal: Skin Care Protocol Initiated - if Braden Score 18 or less Description: If consults are not indicated, leave blank or document N/A Outcome: Progressing Goal: Nutrition Consult-if indicated Outcome: Progressing   Problem: RH BOWEL ELIMINATION Goal: RH STG MANAGE BOWEL WITH ASSISTANCE Description: STG Manage Bowel with min Assistance. Outcome: Progressing Goal: RH STG MANAGE BOWEL W/MEDICATION W/ASSISTANCE Description: STG Manage Bowel with Medication with min Assistance. Outcome: Progressing   Problem: RH BLADDER ELIMINATION Goal: RH STG MANAGE BLADDER WITH ASSISTANCE Description: STG Manage Bladder With min Assistance Outcome: Progressing Goal: RH STG MANAGE BLADDER WITH MEDICATION WITH ASSISTANCE Description: STG Manage Bladder With Medication With min Assistance. Outcome: Progressing   Problem: RH SKIN INTEGRITY Goal: RH STG SKIN FREE OF INFECTION/BREAKDOWN Description: Skin to remain free from infection and breakdown with min assist. Outcome: Progressing Goal: RH STG MAINTAIN SKIN INTEGRITY WITH ASSISTANCE Description: STG Maintain Skin Integrity With min Assistance. Outcome: Progressing Goal: RH STG ABLE TO PERFORM INCISION/WOUND CARE W/ASSISTANCE Description: STG Able To Perform Incision/Wound Care With mod Assistance. Outcome: Progressing   Problem: RH SAFETY Goal: RH STG ADHERE TO SAFETY PRECAUTIONS W/ASSISTANCE/DEVICE Description: STG Adhere to Safety Precautions With min Assistance and appropriate assistive Device. Outcome: Progressing Goal: RH STG DECREASED RISK OF FALL WITH ASSISTANCE Description: STG Decreased Risk of Fall With cues and reminders Assistance. Outcome: Progressing   Problem: RH PAIN MANAGEMENT Goal: RH STG PAIN MANAGED AT OR BELOW PT'S PAIN GOAL Description: <4 on a 0-10 pain scale. Outcome:  Progressing   Problem: RH KNOWLEDGE DEFICIT GENERAL Goal: RH STG INCREASE KNOWLEDGE OF SELF CARE AFTER HOSPITALIZATION Description: Patient and caregivers will be able to demonstrate knowledge of wound care, dressing changes, medication management, safety precautions, and follow up care with the medical providers with min assist from CIR staff. Outcome: Progressing

## 2019-08-10 NOTE — Consult Note (Signed)
WOC Nurse Consult Note: Patient receiving care in [facility] 4M06.   Reason for Consult: head wound Wound type: extensive head laceration repaired on 08/03/19 Pressure Injury POA: Yes/No/NA Measurement: entire wound line could not be visualized due to the extensive dried secretions on the scalp. Wound bed: Drainage (amount, consistency, odor)  Periwound: Dressing procedure/placement/frequency: I spoke personally with Dr. Theodora Blow and explained that from what I could glean from the record, staff were providing appropriate wound care and if there were additional needs to please reach out to the surgeon who repaired the wound initially, Dr. Alfonzo Beers. Monitor the wound area(s) for worsening of condition such as: Signs/symptoms of infection,  Increase in size,  Development of or worsening of odor, Development of pain, or increased pain at the affected locations.  Notify the medical team if any of these develop.  Thank you for the consult.  WOC nurse will not follow at this time.  Please re-consult the WOC team if needed.  Helmut Muster, RN, MSN, CWOCN, CNS-BC, pager [phone]
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
## 2019-08-10 NOTE — Progress Notes (Signed)
Occupational Therapy Session Note  Patient Details  Name: Shelly Houston MRN: [id number] Date of Birth: [birth year]  Today's Date: 08/10/2019 OT Individual Time: 1400-1500 OT Individual Time Calculation (min): 60 min    Short Term Goals: Week 1:  OT Short Term Goal 1 (Week 1): patient will complete rolling in bed with min A OT Short Term Goal 2 (Week 1): patient will complete sit to stand with min A of one OT Short Term Goal 3 (Week 1): patient will complete eating and oral care tasks wtih mod A using assistive devices and UE support OT Short Term Goal 4 (Week 1): patient will direct brace management with mod cues OT Short Term Goal 5 (Week 1): patient will increase bilateral UE proximal AROM by 25 % for increased use in functional tasks and support for mobility  Skilled Therapeutic Interventions/Progress Updates:    1:1. Pt received in TIS agreeable to OT. Interpreter present throughout session. Educated pt and son on pressure relief protocol while up in the bed and son able to complete a pressure break by tilting back TIS safely. Pt completes oral care with U cuff on LUE around fingers with MOD A overall to maintain shoulder flexion to brush teeth.OT washes pt hair to continue to clean dried blood off of newly shaved head with total A. Pt completes 1x150' of functional mobility with no AD and pt providing MIN A at hips for steadying and power up into standing with +2 as w/c follow. Pt requires VC for longer stride length on RLE and bigger weight shift R. Exited session with pt tilted back in w/c for comfort, son present and call light in reach  Therapy Documentation Precautions:  Precautions Precautions: Fall, Back, Cervical Precaution Comments: educated on wear ccollar required at all times Required Braces or Orthoses: Other Brace (L thumb spica) Other Brace: Minerva brace with C-collar Restrictions Weight Bearing Restrictions: Yes LUE Weight Bearing: Weight bear through elbow only  General:   Vital Signs: Therapy Vitals Temp: 98.4 F (36.9 C) Pulse Rate: 76 Resp: 17 BP: 107/62 Patient Position (if appropriate): Sitting Oxygen Therapy SpO2: 96 % O2 Device: Room Air Pain:   ADL: ADL Eating: Dependent Where Assessed-Eating: Bed level Grooming: Dependent Where Assessed-Grooming: Bed level Upper Body Bathing: Dependent Where Assessed-Upper Body Bathing: Bed level Lower Body Bathing: Dependent Where Assessed-Lower Body Bathing: Bed level Upper Body Dressing: Dependent Where Assessed-Upper Body Dressing: Bed level Toileting: Dependent Vision   Perception    Praxis   Exercises:   Other Treatments:     Therapy/Group: Individual Therapy  Shon Hale 08/10/2019, 3:02 PM
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
## 2019-08-11 ENCOUNTER — Inpatient Hospital Stay (HOSPITAL_COMMUNITY): Payer: Self-pay | Admitting: Occupational Therapy

## 2019-08-11 ENCOUNTER — Inpatient Hospital Stay (HOSPITAL_COMMUNITY): Payer: Self-pay

## 2019-08-11 MED ORDER — LIDOCAINE 5 % EX PTCH
2.0000 | MEDICATED_PATCH | CUTANEOUS | Status: DC
  Administered 2019-08-11 – 2019-08-29 (×19): 2 via TRANSDERMAL
  Filled 2019-08-11 (×20): qty 2

## 2019-08-11 NOTE — Progress Notes (Signed)
BP performed, Type 6 BM @1849 . Pt brief dry when rounded on. Pt had smear @0600 .

## 2019-08-11 NOTE — Progress Notes (Signed)
Patient ID: Shelly Houston, female   DOB: [birth year], 49 y.o.   MRN: [id number]       Subjective: Called to see patient's wound prior to Monday secondary to some drainage and malodor.  Patient with no complaints.  States it drains bloody looking drainage some in the posterior part and some in the anterior part.   Objective: Vital signs in last 24 hours: Temp:  [98.4 F (36.9 C)-98.7 F (37.1 C)] 98.4 F (36.9 C) (06/11 0600) Pulse Rate:  [69-76] 69 (06/11 0600) Resp:  [17-18] 17 (06/11 0600) BP: (106-120)/(61-66) 120/66 (06/11 0600) SpO2:  [94 %-96 %] 95 % (06/11 0600) Last BM Date: (P) 08/10/19  Intake/Output from previous day: 06/10 0701 - 06/11 0700 In: 340 [P.O.:340] Out: 2500 [Urine:2500] Intake/Output this shift: No intake/output data recorded.  PE: Head: scalp wound actually looks quite good.  No erythema is noted.  There is some drainage from mostly a small area in the anterior lateral aspect of her wound but is mostly old bloody drainage.  Malodor is more c/w old blood as opposed to overt infection.  Staples all still intact  Lab Results:  No results for input(s): WBC, HGB, HCT, PLT in the last 72 hours. BMET Recent Labs    08/09/19 0528  NA 138  K 3.9  CL 107  CO2 24  GLUCOSE 110*  BUN 8  CREATININE 0.43*  CALCIUM 8.4*   PT/INR No results for input(s): LABPROT, INR in the last 72 hours. CMP     Component Value Date/Time   NA 138 08/09/2019 0528   K 3.9 08/09/2019 0528   CL 107 08/09/2019 0528   CO2 24 08/09/2019 0528   GLUCOSE 110 (H) 08/09/2019 0528   BUN 8 08/09/2019 0528   CREATININE 0.43 (L) 08/09/2019 0528   CALCIUM 8.4 (L) 08/09/2019 0528   PROT 6.0 (L) 08/08/2019 0628   ALBUMIN 2.5 (L) 08/08/2019 0628   AST 35 08/08/2019 0628   ALT 42 08/08/2019 0628   ALKPHOS 87 08/08/2019 0628   BILITOT 1.0 08/08/2019 0628   GFRNONAA >60 08/09/2019 0528   GFRAA >60 08/09/2019 0528   Lipase  No results found for:  LIPASE     Studies/Results: No results found.  Anti-infectives: Anti-infectives (From admission, onward)   None       Assessment/Plan  Scalp degloving -wound overall is healing quite well -odor is more c/w old blood, but not unreasonable given wound was very contaminated to treat with prophylactic kelfex. -will plan to remove staples on Monday -clean and wash wound as able given bracing limitations    LOS: 4 days    Letha Cape , [facility] 08/11/2019, 11:03 AM Please see Amion for pager number during day hours 7:00am-4:30pm or 7:00am -11:30am on weekends

## 2019-08-11 NOTE — Progress Notes (Signed)
Physical Therapy Session Note 05 min missed time due to pt eating breakfast  Patient Details  Name: Shelly Houston MRN: [id number] Date of Birth: 12-06-1970  Today's Date: 08/11/2019 PT Individual Time: 0910-1020 PT Individual Time Calculation (min): 70 min   Short Term Goals: Week 1:  PT Short Term Goal 1 (Week 1): Pt will perform bed mobility with minA. PT Short Term Goal 2 (Week 1): Pt will perform sit to stand transfer with minA. PT Short Term Goal 3 (Week 1): Pt will perform stand pivot transfer with minA. PT Short Term Goal 4 (Week 1): Pt will ambulate 58' with minA. Week 2:    Week 3:     Skilled Therapeutic Interventions/Progress Updates:     PAIN 7/10, pt brought icepacks to session with her and utilized during rest, rest breaks in sitting as needed, repositioned as needed.  Pt initially OOB in wc eating breakfast w/assist from niece.  Requested to finish eating, returned in 10 min and pt  agreeable to session.  Rates pain 7/10 but clearly more comfortable than previous session w/this therapist 61/10  Niece in room and instructed w/operation of tilt feature of wc so she could assist if pt requested repositioning during day while in wc. Translator present for full session. Transported to gym for treatment.  Chair tilted/brought to upright by therapist, tolerated by patient. Pt able to lean forward w/min assist and donns second gown for modesty w/max assist. STS w/min assist and gait 258ft w/HHA- min assist for balance, mult mild balance losses primarily to L w/min assist for recovery. Pt rests several min between efforts, increased RR/returns to normal w/3-5 min recovery. Repeated gait/rest as above. Third gait trial x 183ft due to fatigue, rests in sitting w/icepacks to bilat shoulders for relief. During rest breaks, legrests adjusted to appropriate length for optimal comfort and pressure relief in sitting. Pt then performed RUE strength/coordination task involving grasping  1in  Cubes placed on bedside table at waist level and lifting across table to 3in cup/releasing into cup.  Performs w/min/hand over hand assist primarily for shoulder elevation and adduction. At end of session pt transported back to room and left oob in wc, repositioned for comfort, needs in reach and niece at bedside.    Therapy Documentation Precautions:  Precautions Precautions: Fall, Back, Cervical Precaution Comments: educated on wear ccollar required at all times Required Braces or Orthoses: Other Brace (L thumb spica) Other Brace: Minerva brace with C-collar Restrictions Weight Bearing Restrictions: Yes LUE Weight Bearing: Weight bear through elbow only    Therapy/Group: Individual Therapy  Rada Hay, PT   Shearon Balo 08/11/2019, 4:44 PM

## 2019-08-11 NOTE — Progress Notes (Signed)
Patient and son was instructed on how to administer Lovenox sq. Questions encouraged. Patient and son verbalized understanding of instruction and was able to teach back.

## 2019-08-11 NOTE — Plan of Care (Signed)
  Problem: Consults Goal: RH GENERAL PATIENT EDUCATION Description: See Patient Education module for education specifics. Outcome: Progressing Goal: Skin Care Protocol Initiated - if Braden Score 18 or less Description: If consults are not indicated, leave blank or document N/A Outcome: Progressing Goal: Nutrition Consult-if indicated Outcome: Progressing   Problem: RH BOWEL ELIMINATION Goal: RH STG MANAGE BOWEL WITH ASSISTANCE Description: STG Manage Bowel with min Assistance. Outcome: Progressing Goal: RH STG MANAGE BOWEL W/MEDICATION W/ASSISTANCE Description: STG Manage Bowel with Medication with min Assistance. Outcome: Progressing   Problem: RH BLADDER ELIMINATION Goal: RH STG MANAGE BLADDER WITH ASSISTANCE Description: STG Manage Bladder With min Assistance Outcome: Progressing Goal: RH STG MANAGE BLADDER WITH MEDICATION WITH ASSISTANCE Description: STG Manage Bladder With Medication With min Assistance. Outcome: Progressing   Problem: RH SKIN INTEGRITY Goal: RH STG SKIN FREE OF INFECTION/BREAKDOWN Description: Skin to remain free from infection and breakdown with min assist. Outcome: Progressing Goal: RH STG MAINTAIN SKIN INTEGRITY WITH ASSISTANCE Description: STG Maintain Skin Integrity With min Assistance. Outcome: Progressing Goal: RH STG ABLE TO PERFORM INCISION/WOUND CARE W/ASSISTANCE Description: STG Able To Perform Incision/Wound Care With mod Assistance. Outcome: Progressing   Problem: RH SAFETY Goal: RH STG ADHERE TO SAFETY PRECAUTIONS W/ASSISTANCE/DEVICE Description: STG Adhere to Safety Precautions With min Assistance and appropriate assistive Device. Outcome: Progressing Goal: RH STG DECREASED RISK OF FALL WITH ASSISTANCE Description: STG Decreased Risk of Fall With cues and reminders Assistance. Outcome: Progressing   Problem: RH PAIN MANAGEMENT Goal: RH STG PAIN MANAGED AT OR BELOW PT'S PAIN GOAL Description: <4 on a 0-10 pain scale. Outcome:  Progressing   Problem: RH KNOWLEDGE DEFICIT GENERAL Goal: RH STG INCREASE KNOWLEDGE OF SELF CARE AFTER HOSPITALIZATION Description: Patient and caregivers will be able to demonstrate knowledge of wound care, dressing changes, medication management, safety precautions, and follow up care with the medical providers with min assist from CIR staff. Outcome: Progressing   
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
## 2019-08-11 NOTE — Progress Notes (Signed)
Patient's bowel program was started at 1945. No results to report. Will report to night shift nurse. Leane Para, LPN

## 2019-08-11 NOTE — Progress Notes (Signed)
Occupational Therapy Session Note  Patient Details  Name: Shelly Houston MRN: [id number] Date of Birth: [birth year]  Today's Date: 08/11/2019 OT Individual Time: 0076-2263 and 1400-1510 OT Individual Time Calculation (min): 61 min and 70 min  Short Term Goals: Week 1:  OT Short Term Goal 1 (Week 1): patient will complete rolling in bed with min A OT Short Term Goal 2 (Week 1): patient will complete sit to stand with min A of one OT Short Term Goal 3 (Week 1): patient will complete eating and oral care tasks wtih mod A using assistive devices and UE support OT Short Term Goal 4 (Week 1): patient will direct brace management with mod cues OT Short Term Goal 5 (Week 1): patient will increase bilateral UE proximal AROM by 25 % for increased use in functional tasks and support for mobility  Skilled Therapeutic Interventions/Progress Updates:    Pt greeted in bed, agreeable to start session by eating her breakfast. We discussed her multiple precautions prior to mobility, then logroll completed for transitioning to EOB with Max A, pillow between legs to prevent twisting. While EOB, pt completed stand pivot<TIS with Min A. Used pillows to address back and elbow pain while also supporting neck. Son assisted with tilting her back for comfort while still being in a safe position to eat. OT also provided ice packs to B shoulders for pain relief with pt reporting effectiveness. HOH to bring food to mouth using small hand cuff, pt able to assist more with scooping food vcs bringing food to mouth. Educated son on setup of adaptive eating tool and demonstrated how to assist pt while facilitating independence for NMR. While eating her oatmeal, pt wished to stop meal for now due to pain, wanting instead to wash her face at the sink. HOH to bring hand to face with pt able to assist more with swiping cloth around cheeks, nose, and eyes. OT washed hands with sanitizer with gentle massage applied which pt reported  helped with neuropathic pain in hands. She then wanted to continue eating. Educated son on TIS positioning with use of props for pain mgt, reviewed joint protection techniques (I.e. supporting wrist and elbow) while assisting her with scooping/bringing food to mouth. Left pt in TIS, in care of son to finish her meal, awaiting next therapist. Tx focus placed on NMR, OOB tolerance, pain mgt, and ADL retraining.   RN also in during session to provide pain medicine. Spanish interpretor present.   2nd Session 1:1 tx (70 min) Pt greeted in bed with niece [name] present. Verbal order obtained from MD, okay'd loosening brace for UB dressing. 1 assist for logrolling Lt>Rt in flat bed with pillow between legs for back precaution adherence, 2nd helper at this time removed hospital gowns. While supine, 1 helper assisted pt with maintaining neutral neck while 2nd helper threaded head through shirt. After c-collar was fastened more securely, pt completed supine<sit with Max A. 2nd helper was able to thread pts arms into shirt while OT provided Min balance assistance. Once pt returned to supine, she logrolled towards the Rt side where the thoracic portion of the brace was loosened and niece completed gentle UB bathing and pulled shirt down over trunk. With entire brace fastened securely, she required Max A for donning leggings, rolling Rt>Lt with pillow between legs and Mod A to prevent twisting. Advised son to bring in loose fitting shorts in the future. 3 helpers for boosting pt up safely in bed while keeping neck supported. Pt reported  therapeutic activity today did not increase her pain, grateful that she could finally wear her own shirt. Pt remained lying comfortably in bed with all needs within reach, bed alarm set, and son at bedside.   Pt also required Total assist for pericare and brief change s/p episode of bladder incontinence. Bed mobility described in manner as written above.      Therapy  Documentation Precautions:  Precautions Precautions: Fall, Back, Cervical Precaution Comments: educated on wear ccollar required at all times Required Braces or Orthoses: Other Brace (L thumb spica) Other Brace: Minerva brace with C-collar Restrictions Weight Bearing Restrictions: Yes LUE Weight Bearing: Weight bear through elbow only ADL: ADL Eating: Dependent Where Assessed-Eating: Bed level Grooming: Dependent Where Assessed-Grooming: Bed level Upper Body Bathing: Dependent Where Assessed-Upper Body Bathing: Bed level Lower Body Bathing: Dependent Where Assessed-Lower Body Bathing: Bed level Upper Body Dressing: Dependent Where Assessed-Upper Body Dressing: Bed level Toileting: Dependent      Therapy/Group: Individual Therapy  Aydeen Blume A Teegan Guinther 08/11/2019, 12:37 PM

## 2019-08-11 NOTE — Progress Notes (Signed)
RN at bedside to perform digital stimulation; patient agreeable. She was positioned to left side. During digital stimulation, patient complained of pain and requested RN to stop. No stool came out and no stool was felt on rectal vault during stimulation. Pericare done.

## 2019-08-12 ENCOUNTER — Inpatient Hospital Stay (HOSPITAL_COMMUNITY): Payer: Self-pay | Admitting: Occupational Therapy

## 2019-08-12 ENCOUNTER — Inpatient Hospital Stay (HOSPITAL_COMMUNITY): Payer: Self-pay

## 2019-08-12 DIAGNOSIS — K592 Neurogenic bowel, not elsewhere classified: Secondary | ICD-10-CM

## 2019-08-12 DIAGNOSIS — S14129S Central cord syndrome at unspecified level of cervical spinal cord, sequela: Secondary | ICD-10-CM

## 2019-08-12 DIAGNOSIS — S0101XS Laceration without foreign body of scalp, sequela: Secondary | ICD-10-CM

## 2019-08-12 LAB — URINE CULTURE: Culture: >=100000 — AB

## 2019-08-12 MED ORDER — CEPHALEXIN 250 MG PO CAPS
500.0000 mg | ORAL_CAPSULE | Freq: Three times a day (TID) | ORAL | Status: DC
  Administered 2019-08-12 – 2019-08-23 (×33): 500 mg via ORAL
  Filled 2019-08-12 (×33): qty 2

## 2019-08-12 NOTE — Plan of Care (Signed)
  Problem: RH BOWEL ELIMINATION Goal: RH STG MANAGE BOWEL WITH ASSISTANCE Description: STG Manage Bowel with min Assistance. Outcome: Not Progressing; bowel program Flowsheets (Taken 08/12/2019 1609) STG: Pt will manage bowels with assistance: 3-Moderate assistance   Problem: RH BOWEL ELIMINATION Goal: RH STG MANAGE BOWEL W/MEDICATION W/ASSISTANCE Description: STG Manage Bowel with Medication with min Assistance. Outcome: Not Progressing; bowel program   Problem: RH BLADDER ELIMINATION Goal: RH STG MANAGE BLADDER WITH ASSISTANCE Description: STG Manage Bladder With min Assistance Outcome: Not Progressing; pvr    Problem: RH SKIN INTEGRITY Goal: RH STG SKIN FREE OF INFECTION/BREAKDOWN Description: Skin to remain free from infection and breakdown with min assist. Outcome: Not Progressing; dressing change on head  area as need

## 2019-08-12 NOTE — Progress Notes (Signed)
RN reiterated how to administer Lovenox.  Patient's son agreeable to inject the  Lovenox  with RN supervision and was able to administer it correctly.
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
## 2019-08-12 NOTE — Progress Notes (Signed)
Occupational Therapy Session Note  Patient Details  Name: Shelly Houston MRN: [id number] Date of Birth: [birth year]  Today's Date: 08/12/2019 OT Individual Time: 9211-9417 OT Individual Time Calculation (min): 73 min   Short Term Goals: Week 1:  OT Short Term Goal 1 (Week 1): patient will complete rolling in bed with min A OT Short Term Goal 2 (Week 1): patient will complete sit to stand with min A of one OT Short Term Goal 3 (Week 1): patient will complete eating and oral care tasks wtih mod A using assistive devices and UE support OT Short Term Goal 4 (Week 1): patient will direct brace management with mod cues OT Short Term Goal 5 (Week 1): patient will increase bilateral UE proximal AROM by 25 % for increased use in functional tasks and support for mobility  Skilled Therapeutic Interventions/Progress Updates:    Pt greeted in bed, reporting 8/10 pain at rest. Notified RN who provided pain medicine at start of session. OT also provided her with ice packs to B shoulders and Rt elbow for pain mgt, which pt reported helped. With bed placed in chair position, pt engaged in eating to work on Rt UE NMR. Pt with much improved ability to assist OT with bringing spoon to mouth compared to session yesterday, Min HOH while using small adaptive cuff. At times when neuropathic pain in hand increased, OT provided gentle massage to hand and we also played meaningful music. She needed increased assistance to eat the rest of her oatmeal, but was able to eat with Min HOH while eating french toast and eggs and when drinking orange juice. Pt was then agreeable to transfer to the TIS. Max A for supine<sit using logroll technique. Min vcs for NWB Lt hand while scooting hips forward. Her son Celene Skeen set up the TIS in prep for transfer with cue to initiate. CGA for stand pivot TIS. Educated son on how to apply leg rests with hands on training. Pt was repositioned for comfort and pain mgt using ice packs as needed. She  was left in care of RN for dressing change to head.   Therapy Documentation Precautions:  Precautions Precautions: Fall, Back, Cervical Precaution Comments: educated on wear ccollar required at all times Required Braces or Orthoses: Other Brace (L thumb spica) Other Brace: Minerva brace with C-collar Restrictions Weight Bearing Restrictions: Yes LUE Weight Bearing: Weight bearing as tolerated ADL: ADL Eating: Dependent Where Assessed-Eating: Bed level Grooming: Dependent Where Assessed-Grooming: Bed level Upper Body Bathing: Dependent Where Assessed-Upper Body Bathing: Bed level Lower Body Bathing: Dependent Where Assessed-Lower Body Bathing: Bed level Upper Body Dressing: Dependent Where Assessed-Upper Body Dressing: Bed level Toileting: Dependent      Therapy/Group: Individual Therapy  Jamarl Pew A Emiyah Spraggins 08/12/2019, 12:46 PM

## 2019-08-12 NOTE — Progress Notes (Signed)
Bowel program done with medium results.

## 2019-08-12 NOTE — Progress Notes (Signed)
Physical Therapy Session Note  Patient Details  Name: Shelly Houston MRN: [id number] Date of Birth: 1971-02-27  Today's Date: 08/12/2019 PT Individual [id number]  - 1015; 60 min;  1400-1500, 60 min     Short Term Goals: Week 1:  PT Short Term Goal 1 (Week 1): Pt will perform bed mobility with minA. PT Short Term Goal 2 (Week 1): Pt will perform sit to stand transfer with minA. PT Short Term Goal 3 (Week 1): Pt will perform stand pivot transfer with minA. PT Short Term Goal 4 (Week 1): Pt will ambulate 80' with minA.     Skilled Therapeutic Interventions/Progress Updates:  Lissa Hoard, medical interpreter present. tx 1: Pt up in tilt in space w/c, tilted back.  She rated pain 6/10, primarily r chest.  She had ice packs on chest and L elbow, x 20 min.  PT removed. Pt's pain decreased significantly when w/c tilted up substantilly, possibly due to distraction of chest laterally as her UEs tend to slide backwards, even on pillow. PT adjusted ht of R armrest, and introduced full lap tray, which pt stated helped her pain  Pt leaned forward in w/c to facilitate PT donning gown.  Pt stated that she felt much better sitting upright in w/c.  Sit> stand from w/c with supervision.  Gait training iwht PT providing min guard at pt's waist; she chose not to hold onto PT; x 90' with slight LOB regained independently, during turn to R; x 70' without turns, min guard assist.  Seated in w/c, Therapeutic exercise performed with LE to increase strength for functional mobility 10 x 1 bil adductor squeezes against ball, L/R long arc quad knee extensions with 3 # wt.  Standing: 10 x 1 each bil calf raises, toe raises with light touch on sturdy table in front of her and min assist for balance.  Stand pivot transfer with CGA w/c> recliner in room.  Pt left with ice packs bil forearms, bil LEs elevated and needs at hand.  tx 2:  Sonia, medical interpreter present.  Pt resting in bed; denied pain.  Supine> sit with  mod assist for trunk, and use of bed features.  Pt is unable to tolerate R side lying/pushing up with RUE due to thoracic pain.  Stand pivot bed> w/c with CGA.  Gait training as above, x 250' with min assist for mild LOB during turns.    Attempted standing at sturdy table for UE activity, but pt had increase in RUE pain and had to terminate.  Pt rested in tilted back w/c x 3 minutes.  Stand pivot w/c> Kinetron with CGA.  Strengthening using Kinetron in sitting at level 50 cm/sec in sitting, x 1 minute at 50 cm/sec, x 2 minutes at 40 cm/sec.  Stand pivot w/c > bed with CGA.  Sit> supine with supervision, HOB raised.  PT asked Nsg to look at head wounds due to slight bleeding during session.  At end of session, bed alarm set and needs at hand; [facility] attending pt.     Therapy Documentation Precautions:  Precautions Precautions: Fall, Back, Cervical Precaution Comments: educated on wear ccollar required at all times Required Braces or Orthoses: Other Brace (L thumb spica) Other Brace: Minerva brace with C-collar Restrictions Weight Bearing Restrictions: Yes LUE Weight Bearing: Weight bear through elbow only       Therapy/Group: Individual Therapy  Tameem Pullara 08/12/2019, 7:55 AM

## 2019-08-12 NOTE — Progress Notes (Signed)
MD notified of urine culture results.

## 2019-08-13 NOTE — Progress Notes (Signed)
Bowel program started patient had bm after 20 mins.

## 2019-08-13 NOTE — Plan of Care (Signed)
  Problem: RH BOWEL ELIMINATION Goal: RH STG MANAGE BOWEL WITH ASSISTANCE Description: STG Manage Bowel with min Assistance. Outcome: Not Progressing; bowel program   Problem: RH SKIN INTEGRITY Goal: RH STG SKIN FREE OF INFECTION/BREAKDOWN Description: Skin to remain free from infection and breakdown with min assist. Outcome: Not Progressing; bleeding noted on forehead and top of head part  Continued current treatment; dressing 4-5x/day
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
## 2019-08-14 ENCOUNTER — Inpatient Hospital Stay (HOSPITAL_COMMUNITY): Payer: Self-pay | Admitting: Physical Therapy

## 2019-08-14 ENCOUNTER — Inpatient Hospital Stay (HOSPITAL_COMMUNITY): Payer: Self-pay | Admitting: Occupational Therapy

## 2019-08-14 LAB — CBC
HCT: 35.9 % — ABNORMAL LOW (ref 36.0–46.0)
Hemoglobin: 11.7 g/dL — ABNORMAL LOW (ref 12.0–15.0)
MCH: 30.1 pg (ref 26.0–34.0)
MCHC: 32.6 g/dL (ref 30.0–36.0)
MCV: 92.3 fL (ref 80.0–100.0)
Platelets: 433 10*3/uL — ABNORMAL HIGH (ref 150–400)
RBC: 3.89 MIL/uL (ref 3.87–5.11)
RDW: 13.6 % (ref 11.5–15.5)
WBC: 8.4 10*3/uL (ref 4.0–10.5)
nRBC: 0 % (ref 0.0–0.2)

## 2019-08-14 LAB — BASIC METABOLIC PANEL
Anion gap: 10 (ref 5–15)
BUN: 11 mg/dL (ref 6–20)
CO2: 25 mmol/L (ref 22–32)
Calcium: 9.4 mg/dL (ref 8.9–10.3)
Chloride: 102 mmol/L (ref 98–111)
Creatinine, Ser: 0.49 mg/dL (ref 0.44–1.00)
GFR calc Af Amer: >60 mL/min (ref >60)
GFR calc non Af Amer: >60 mL/min (ref >60)
Glucose, Bld: 107 mg/dL — ABNORMAL HIGH (ref 70–99)
Potassium: 3.8 mmol/L (ref 3.5–5.1)
Sodium: 137 mmol/L (ref 135–145)

## 2019-08-14 NOTE — Progress Notes (Signed)
Physical Therapy Session Note  Patient Details  Name: Shelly Houston MRN: [id number] Date of Birth: 1970/11/30  Today's Date: 08/14/2019 PT Individual Time: 0915-1010 PT Individual Time Calculation (min): 55 min   Short Term Goals: Week 1:  PT Short Term Goal 1 (Week 1): Pt will perform bed mobility with minA. PT Short Term Goal 2 (Week 1): Pt will perform sit to stand transfer with minA. PT Short Term Goal 3 (Week 1): Pt will perform stand pivot transfer with minA. PT Short Term Goal 4 (Week 1): Pt will ambulate 30' with minA.  Skilled Therapeutic Interventions/Progress Updates:    Pt received seated in recliner in room, agreeable to PT session. No complaints of pain at rest, does have onset of back pain and RUE pain with mobility. Pt does not rate pain, premedicated prior to start of therapy session. Use of repositioning and rest breaks during therapy session for pain management. Pt's scalp incision found to be draining during therapy session, RN informed and had redressed wound prior to start of therapy session. Additional absorbant bandages added for drainage management. Pt is at [facility] level for sit to stand transfers throughout session. Ambulation up to 150 ft with min HHA, some ataxia of BLE during gait. Attempt to initiate stair training. While attempting to ascend one 6" step pt reports feeling "dizzy" and requires seated rest break. With seated rest break symptoms resolve and no further complaints during session. Pt is then able to ascend/descend one 6" step with RUE support on rail, max cueing to not use LUE to assist, and max A for standing balance. Pt unable to safely navigate more stairs. Standing alt L/R 4" step-taps with min HHA on RUE progressing to step-ups, 2 x 5 reps to fatigue. Pt safer navigating stairs/steps with HHA vs use of rail. Pt also exhibits decreased R hip flexor strength when ascending step and utilizes compensatory techniques including use of R hip adductors to clear  step. Pt requests to return to bed at end of session. Sit to supine via logroll with mod A for BLE management. Pt requires assist x 2 to safely scoot up towards HOB. Pt left semi-reclined in bed with needs in reach, bed alarm in place at end of session.  Therapy Documentation Precautions:  Precautions Precautions: Fall, Back, Cervical Precaution Comments: educated on wear ccollar required at all times Required Braces or Orthoses: Other Brace (L thumb spica) Other Brace: Minerva brace with C-collar Restrictions Weight Bearing Restrictions: Yes LUE Weight Bearing: Weight bearing as tolerated   Therapy/Group: Individual Therapy   Peter Congo, PT, DPT  08/14/2019, 12:27 PM

## 2019-08-14 NOTE — Progress Notes (Addendum)
All staples were removed from anterior, parietal, posterior scalp without complication. scant SS drainage from frontal scalp wound. Some SS and ?scant purulent drainage on dressing from posterior scalp would that is foul-smelling. After cleaning the wound there was no active drainage. There is no cellulitis or bleeding.   Recommend daily cleaning of the patients scalp with soap and water as well as BID bacitracin and non-stick dressings. Call trauma services as needed.   Hosie Spangle, PA-C

## 2019-08-14 NOTE — Progress Notes (Signed)
Chaplain responded to phone call to spiritual care office from patient's friend who was attempting to arrange for a visit from patient's priest.  While neither denying nor confirming patient's presence, Chaplain agreed to check into the situation.  Working with Ms. Berg's nurse, the 6M Charge Nurse and Mrs.Slinger's physician, Chaplain was successful in having Fr. Chana Bode added to the visitor list at the front desk. Father Homero Fellers is allowed to come for a one-time visit with Ms. Vore.   Chaplain informed daughter of this event and passed on responsibility for reaching out to Fr. Seabo to the daughter.    Vernell Morgans Chaplain Resident

## 2019-08-14 NOTE — Progress Notes (Signed)
Occupational Therapy Session Note  Patient Details  Name: Shelly Houston MRN: [id number] Date of Birth: [birth year]  Today's Date: 08/14/2019 OT Individual Time: 9518-8416 OT Individual Time Calculation (min): 69 min    Short Term Goals: Week 1:  OT Short Term Goal 1 (Week 1): patient will complete rolling in bed with min A OT Short Term Goal 2 (Week 1): patient will complete sit to stand with min A of one OT Short Term Goal 3 (Week 1): patient will complete eating and oral care tasks wtih mod A using assistive devices and UE support OT Short Term Goal 4 (Week 1): patient will direct brace management with mod cues OT Short Term Goal 5 (Week 1): patient will increase bilateral UE proximal AROM by 25 % for increased use in functional tasks and support for mobility  Skilled Therapeutic Interventions/Progress Updates:    Upon entering the room, pt supine in bed with RN present having just given pt medications and changed dressings. Spanish interpreter and pt's son also present during session. Pt performed supine >sit with CGA. Total A to don LB clothing from EOB. Pt standing with min A for balance while therapist pulls over B hips. Pt's dressing begins leaking blood with RN being called back into room to address dressing again. OT removing L brace for hygiene and then placing brace back on. Pt reports feeling very uncomfortable in tilt in space and transitioned to recliner chair with CGA. Pt reports brace very uncomfortable and while supported by chair OT adjusting brace for better fit and pt comfort while still being supportive. AAROM with R UE in all planes of movement x 10 reps each. Pt remained in recliner chair with call bell and all needs within reach.   Therapy Documentation Precautions:  Precautions Precautions: Fall, Back, Cervical Precaution Comments: educated on wear ccollar required at all times Required Braces or Orthoses: Other Brace (L thumb spica) Other Brace: Minerva brace  with C-collar Restrictions Weight Bearing Restrictions: Yes LUE Weight Bearing: Weight bearing as tolerated  ADL: ADL Eating: Dependent Where Assessed-Eating: Bed level Grooming: Dependent Where Assessed-Grooming: Bed level Upper Body Bathing: Dependent Where Assessed-Upper Body Bathing: Bed level Lower Body Bathing: Dependent Where Assessed-Lower Body Bathing: Bed level Upper Body Dressing: Dependent Where Assessed-Upper Body Dressing: Bed level Toileting: Dependent   Therapy/Group: Individual Therapy  Alen Bleacher 08/14/2019, 12:50 PM

## 2019-08-14 NOTE — Progress Notes (Signed)
Bowel program was not performed during dayshift. RN asked pt with daughter in room, if she would comply with bowel program, which pt agreed to. Dig stim performed prior to suppository being administered. However, when finger entered pt's rectum, pt cried out in pain.  Pt refused BP due to rectal pain.

## 2019-08-14 NOTE — Progress Notes (Signed)
Physical Therapy Session Note  Patient Details  Name: Shelly Houston MRN: [id number] Date of Birth: 03/13/1970  Today's Date: 08/14/2019 PT Individual Time: 1400-1500 PT Individual Time Calculation (min): 60 min   Short Term Goals: Week 1:  PT Short Term Goal 1 (Week 1): Pt will perform bed mobility with minA. PT Short Term Goal 2 (Week 1): Pt will perform sit to stand transfer with minA. PT Short Term Goal 3 (Week 1): Pt will perform stand pivot transfer with minA. PT Short Term Goal 4 (Week 1): Pt will ambulate 56' with minA.  Skilled Therapeutic Interventions/Progress Updates:    Pt received seated in bed, agreeable to PT session. No complaints of pain. Semi-reclined to sitting EOB with max A for trunk control and LE management. Pt is CGA for sit to stand transfers throughout session. Ambulation to therapy gym with min HHA, some LE ataxia noted during gait. Session focus on BLE strengthening and coordination tasks. Standing alt L/R cone taps with min HHA, pt exhibits decreased coordination in LLE>RLE. Pt then reports increase in neck strain attempting to see cones, deferred this task. Sidesteps L/R 2 x 10 ft with min HHA progressing to sidesteps 2 x 10 ft L/R with use of orange theraband around BLE for increased resistance. Monster walk 4 x 10 ft forwards with min HHA and OTB for BLE strengthening. Standing mini-squats 2 x 10 reps with RUE support on chair for BLE strengthening. Seated BLE strengthening with 2-3# ankle weights: marches, LAQ, 2 x 10 reps each. Ambulation x 150 ft with use of 3# ankle weights for LE proprioception and strength training and min HHA before onset of fatigue. Pt ambulates remainder of the way back to her room with no ankle weights. Pt left seated in recliner in room with needs in reach at end of session.  Therapy Documentation Precautions:  Precautions Precautions: Fall, Back, Cervical Precaution Comments: educated on wear ccollar required at all  times Required Braces or Orthoses: Other Brace (L thumb spica) Other Brace: Minerva brace with C-collar Restrictions Weight Bearing Restrictions: Yes LUE Weight Bearing: Weight bearing as tolerated    Therapy/Group: Individual Therapy   Peter Congo, PT, DPT  08/14/2019, 3:41 PM
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
## 2019-08-15 ENCOUNTER — Inpatient Hospital Stay (HOSPITAL_COMMUNITY): Payer: Self-pay | Admitting: Physical Therapy

## 2019-08-15 ENCOUNTER — Inpatient Hospital Stay (HOSPITAL_COMMUNITY): Payer: Self-pay

## 2019-08-15 LAB — CBC WITH DIFFERENTIAL/PLATELET
Abs Immature Granulocytes: 0.12 10*3/uL — ABNORMAL HIGH (ref 0.00–0.07)
Basophils Absolute: 0 10*3/uL (ref 0.0–0.1)
Basophils Relative: 0 %
Eosinophils Absolute: 0.1 10*3/uL (ref 0.0–0.5)
Eosinophils Relative: 2 %
HCT: 36.2 % (ref 36.0–46.0)
Hemoglobin: 11.9 g/dL — ABNORMAL LOW (ref 12.0–15.0)
Immature Granulocytes: 2 %
Lymphocytes Relative: 18 %
Lymphs Abs: 1.3 10*3/uL (ref 0.7–4.0)
MCH: 30.4 pg (ref 26.0–34.0)
MCHC: 32.9 g/dL (ref 30.0–36.0)
MCV: 92.6 fL (ref 80.0–100.0)
Monocytes Absolute: 0.5 10*3/uL (ref 0.1–1.0)
Monocytes Relative: 8 %
Neutro Abs: 5.1 10*3/uL (ref 1.7–7.7)
Neutrophils Relative %: 70 %
Platelets: 432 10*3/uL — ABNORMAL HIGH (ref 150–400)
RBC: 3.91 MIL/uL (ref 3.87–5.11)
RDW: 13.8 % (ref 11.5–15.5)
WBC: 7.2 10*3/uL (ref 4.0–10.5)
nRBC: 0 % (ref 0.0–0.2)

## 2019-08-15 LAB — BASIC METABOLIC PANEL
Anion gap: 12 (ref 5–15)
BUN: 12 mg/dL (ref 6–20)
CO2: 22 mmol/L (ref 22–32)
Calcium: 9.4 mg/dL (ref 8.9–10.3)
Chloride: 102 mmol/L (ref 98–111)
Creatinine, Ser: 0.49 mg/dL (ref 0.44–1.00)
GFR calc Af Amer: >60 mL/min (ref >60)
GFR calc non Af Amer: >60 mL/min (ref >60)
Glucose, Bld: 110 mg/dL — ABNORMAL HIGH (ref 70–99)
Potassium: 3.9 mmol/L (ref 3.5–5.1)
Sodium: 136 mmol/L (ref 135–145)

## 2019-08-15 MED ORDER — BACITRACIN ZINC 500 UNIT/GM EX OINT
TOPICAL_OINTMENT | Freq: Two times a day (BID) | CUTANEOUS | Status: DC
  Administered 2019-08-19 – 2019-08-23 (×2): 1 via TOPICAL
  Filled 2019-08-15 (×2): qty 28.4

## 2019-08-15 NOTE — Progress Notes (Signed)
Occupational Therapy Session Note  Patient Details  Name: Shelly Houston MRN: [id number] Date of Birth: Feb 12, 1971  Today's Date: 08/15/2019 OT Individual Time: 2500-3704 OT Individual Time Calculation (min): 75 min    Short Term Goals: Week 1:  OT Short Term Goal 1 (Week 1): patient will complete rolling in bed with min A OT Short Term Goal 2 (Week 1): patient will complete sit to stand with min A of one OT Short Term Goal 3 (Week 1): patient will complete eating and oral care tasks wtih mod A using assistive devices and UE support OT Short Term Goal 4 (Week 1): patient will direct brace management with mod cues OT Short Term Goal 5 (Week 1): patient will increase bilateral UE proximal AROM by 25 % for increased use in functional tasks and support for mobility  Skilled Therapeutic Interventions/Progress Updates:    Pt resting in bed upon arrival with interpreter present.  OT intervention with focus on bed mobility, sitting balance, dressing, sit<>stand, functional transfers, and safety awareness to increase independence with BADLs. Pt head wound bleeding and dressing changed by RN during session.  UB dressing at bed level with tot A. Pt able to raise BUE but unable to thread through sleeves without assistance. Pt dependent for doffing/donning brace. Supine>sit EOB with mod A. Pt required tot A for threading BLE into pants.  Sit<>stand from EOB with CGA and CGA while therapist pulled pants over hips.  Pt amb 5 steps to recliner with CGA.  Pt remained in recliner with all needs within reach and interpreter present.   Therapy Documentation Precautions:  Precautions Precautions: Fall, Back, Cervical Precaution Comments: educated on wear ccollar required at all times Required Braces or Orthoses: Other Brace (L thumb spica) Other Brace: Minerva brace with C-collar Restrictions Weight Bearing Restrictions: No LUE Weight Bearing: Weight bearing as tolerated Pain:  Pt c/o LUE pain; RN admin  meds during session and repositined   Therapy/Group: Individual Therapy  Rich Brave 08/15/2019, 9:19 AM

## 2019-08-15 NOTE — Progress Notes (Signed)
Orthopedic Tech Progress Note Patient Details:  Shelly Houston [birth year] [id number] Patient was in therapy at the time io went to apply arm sling. Left in room told RN Ortho Devices Type of Ortho Device: Arm sling Ortho Device/Splint Interventions: Other (comment)   Post Interventions Patient Tolerated: Well Instructions Provided: Care of device   Donald Pore 08/15/2019, 2:29 PM
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
## 2019-08-15 NOTE — Progress Notes (Signed)
Physical Therapy Weekly Progress Note  Patient Details  Name: Shelly Houston MRN: [id number] Date of Birth: 1970/10/11  Beginning of progress report period: Jul 08, 2019 End of progress report period: Jul 15, 2019   Patient has met 3 of 4 short term goals.  Pt is making good progress towards therapy goals. She is currently mod A overall for bed mobility with cues needed for log-roll technique, is CGA overall for transfers, and is able to ambulate up to 150 ft with min A. Pt exhibits LE ataxia and occasional LOB during standing activities. Pt currently requires max A to navigate 6" steps and min A for 3" steps, is making good progress towards navigating stairs at home. Pt remains motivated and exhibits good participation in therapy sessions.   Patient continues to demonstrate the following deficits muscle weakness, decreased cardiorespiratoy endurance, abnormal tone, unbalanced muscle activation and decreased coordination and decreased standing balance, decreased postural control, decreased balance strategies and difficulty maintaining precautions and therefore will continue to benefit from skilled PT intervention to increase functional independence with mobility.  Patient progressing toward long term goals..  Continue plan of care.  PT Short Term Goals Week 1:  PT Short Term Goal 1 (Week 1): Pt will perform bed mobility with minA. PT Short Term Goal 1 - Progress (Week 1): Progressing toward goal PT Short Term Goal 2 (Week 1): Pt will perform sit to stand transfer with minA. PT Short Term Goal 2 - Progress (Week 1): Met PT Short Term Goal 3 (Week 1): Pt will perform stand pivot transfer with minA. PT Short Term Goal 3 - Progress (Week 1): Met PT Short Term Goal 4 (Week 1): Pt will ambulate 19' with minA. PT Short Term Goal 4 - Progress (Week 1): Met Week 2:  PT Short Term Goal 1 (Week 2): Pt will complete bed mobility with min A PT Short Term Goal 2 (Week 2): Pt will navigate 6" stairs  with min A PT Short Term Goal 3 (Week 2): Pt will maintain static standing balance with Supervision x 5 min   Therapy Documentation Precautions:  Precautions Precautions: Fall, Back, Cervical Precaution Comments: educated on wear ccollar required at all times Required Braces or Orthoses: Other Brace (L thumb spica) Other Brace: Minerva brace with C-collar Restrictions Weight Bearing Restrictions: No LUE Weight Bearing: Weight bearing as tolerated   Therapy/Group: Individual Therapy   Excell Seltzer, PT, DPT  08/15/2019, 7:25 AM

## 2019-08-15 NOTE — Progress Notes (Signed)
Physical Therapy Session Note  Patient Details  Name: Shelly Houston MRN: [id number] Date of Birth: 08/08/1970  Today's Date: 08/15/2019 PT Individual Time: 4174-0814; 1400-1500 PT Individual Time Calculation (min): 60 min and 60 min  Short Term Goals: Week 1:  PT Short Term Goal 1 (Week 1): Pt will perform bed mobility with minA. PT Short Term Goal 2 (Week 1): Pt will perform sit to stand transfer with minA. PT Short Term Goal 3 (Week 1): Pt will perform stand pivot transfer with minA. PT Short Term Goal 4 (Week 1): Pt will ambulate 14' with minA.  Skilled Therapeutic Interventions/Progress Updates:    Session 1: Pt received seated in bed, agreeable to PT session. No complaints of pain at rest, does have onset of RUE pain during mobility. PA notified and to order sling to provide support to UE during mobility. Ice pack applied to R shoulder area at end of session for pain management. Supine to sit with mod A for trunk control from flat bed via log roll. Sit to supine min A for some LE management. Reviewed safe bed mobility and log roll technique, pt still requires max cueing for adherence to spinal precautions with bed mobility. Sit to stand with CGA throughout session. Ambulation to/from therapy gym with min A. Pt continues to exhibit some ataxia and occasional scissoring and LOB during gait. Ascend/descend 8 x 3" stairs with R handrail, mod to max A for balance. Pt very unbalanced when performing stairs with decreased LE control. Standing alt L/R 4" step-ups with min A for balance with no UE support. Trial stairs with no UE support and v/c for step-to gait pattern, min A. Pt exhibits improved ability to perform stairs safely without using UE support. Alt L/R lateral 4" step-ups with min A for balance. Pt reports increase in RUE pain during standing activity. Sit to semi-reclined on wedge on mat table with min A. Semi-reclined BLE strengthening therex: heel slides, hip add squeeze, SAQ with 3#  ankle weights, 2 x 10 reps. Pt requests to return to bed at end of session. Pt left semi-reclined in bed with needs in reach, bed alarm in place, ice pack to R hip. Interpreter present during therapy session.  Session 2: Pt received seated in bed, agreeable to PT session. No complaints of pain. Pt's daughter present in room and updated on pt's ability to go outside with family and reviewed rules per Elita Quick, PA. Pt is mod A for bed mobility for trunk control with cues for log-rolling technique. Pt is CGA for sit to stand and transfers during session with no AD. Ambulation 2 x 250 ft with CGA to min A for balance, occasional ataxia noted. Pt noted to have decreased eccentric quad control during gait R>L. Standing TKE x 10 reps B with use of orange theraband for quad strengthening. Ambulation through obstacle course with no AD and min A with focus on weaving through cones, stepping over obstacles, and navigating up/down airex pad. Pt has several instances of near LOB that she is able to correct with min A. Pt noted to have increased difficulty with turns. Ambulation in a square around cones with focus on improving balance and decreasing speed when turning, CGA. Ambulation x 50 ft with v/c for stopping and turning around, CGA to min A overall with no LOB noted. Four-square stepping over sticks with min A for balance, no instances of LOB noted and good LE clearance. Static balance on airex with no UE support and min A  for balance: normal stance x 60 sec, Romberg stance x 60 sec, R/L modified tandem stance x 30 sec each. Pt requests to return to bed at end of session. Assisted pt back to bed with min A. Pt left semi-reclined in bed with needs in reach, bed alarm in place at end of session. RN notified that pt having increased drainage from her scalp wounds this PM. Interpreter present during therapy session.  Therapy Documentation Precautions:  Precautions Precautions: Fall, Back, Cervical Precaution Comments:  educated on wear ccollar required at all times Required Braces or Orthoses: Other Brace (L thumb spica) Other Brace: Minerva brace with C-collar Restrictions Weight Bearing Restrictions: No LUE Weight Bearing: Weight bearing as tolerated    Therapy/Group: Individual Therapy   Excell Seltzer, PT, DPT  08/15/2019, 3:53 PM
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
## 2019-08-15 NOTE — Progress Notes (Signed)
Patient ID: Shelly Houston, female   DOB: Oct 20, 1970, 49 y.o.   MRN: [id number]   SW met with pt and pt dtr [name] to provide updates from team conference, d/c date 6/29, and discuss family education. Reports she and her sister [name] will be here for family education on Thurs/Fri (6/24;6/25) next week.   Loralee Pacas, MSW, [place] Office: [phone] Cell: [phone] Fax: [phone]

## 2019-08-15 NOTE — Consult Note (Signed)
WOC Nurse Consult Note:  Patient receiving care in [phone].  E.Simaan, PA saw the patient on 08/14/19, and placed a note in the record for her recommended treatment.  I have transcribed her recommendations into orders for wound care.  Please refer any additional wound care questions to the appropriate surgical team. Reason for Consult: Wound type: Pressure Injury POA: Yes/No/NA Measurement: Wound bed: Drainage (amount, consistency, odor)  Periwound: Dressing procedure/placement/frequency: daily cleaning of the patients scalp with soap and water as well as BID bacitracin and non-stick dressings.  Monitor the wound area(s) for worsening of condition such as: Signs/symptoms of infection,  Increase in size,  Development of or worsening of odor, Development of pain, or increased pain at the affected locations.  Notify the medical team if any of these develop.  Thank you for the consult. WOC nurse will not follow at this time.  Please re-consult the WOC team if needed.  Helmut Muster, RN, MSN, CWOCN, CNS-BC, pager [phone]

## 2019-08-16 ENCOUNTER — Inpatient Hospital Stay (HOSPITAL_COMMUNITY): Payer: Self-pay

## 2019-08-16 ENCOUNTER — Inpatient Hospital Stay (HOSPITAL_COMMUNITY): Payer: Self-pay | Admitting: Physical Therapy

## 2019-08-16 MED ORDER — ACETAMINOPHEN 325 MG PO TABS
650.0000 mg | ORAL_TABLET | Freq: Every day | ORAL | Status: DC
  Administered 2019-08-17 – 2019-08-25 (×6): 650 mg via ORAL
  Filled 2019-08-16 (×8): qty 2

## 2019-08-16 NOTE — Progress Notes (Signed)
Occupational Therapy Session Note  Patient Details  Name: Shelly Houston MRN: [id number] Date of Birth: 08/26/1970  Today's Date: 08/16/2019 OT Individual Time: 0802-2336 OT Individual Time Calculation (min): 73 min    Short Term Goals: Week 2:  OT Short Term Goal 1 (Week 2): patient will complete rolling in bed with min A OT Short Term Goal 2 (Week 2): patient will direct brace management with mod cues OT Short Term Goal 3 (Week 2): patient will complete eating and oral care tasks wtih mod A using assistive devices and UE support OT Short Term Goal 4 (Week 2): patient will increase bilateral UE proximal AROM by 25 % for increased use in functional tasks and support for mobility  Skilled Therapeutic Interventions/Progress Updates:    Pt resting in bed upon arrival with interpreter present. Pt agreeable to sitting EOB to complete LB dressing and transferring to recliner for breakfast.  Upon sitting EOB pt's anterior head wound started draining down pt's face.  Pt also c/o increased mid back pain and requested to lay back into bed. Pt continued to c/o increased pain after returning to supine. Pt requested to use urinal.  Pt dependent for hygiene and changing brief. RN and MD present and pt admin meds for pain.  Pt stated she was hungry but pain was too much to sit in recliner.  Unable to position bed and pt adequately for pt to self feed with wrist support/ucuff.  Pt dependent for self feeding this morning.  Pt requested to remain in bed 2/2 increased pain.  Pt also c/o increased RUE hypersensitivity. Bed alarm activated and soft call bell positioned where pt can activate.  Interpreter present.   Therapy Documentation Precautions:  Precautions Precautions: Fall, Back, Cervical Precaution Comments: educated on wear ccollar required at all times Required Braces or Orthoses: Other Brace (L thumb spica) Other Brace: Minerva brace with C-collar Restrictions Weight Bearing Restrictions: No LUE  Weight Bearing: Weight bearing as tolerated Pain:  Pt c/o increased middle back pain with activity and unsupported sitting. Pt also c/o RUE hypersensitivity. Pain unrated.  MD and RN at bedside and meds admin. Repositioned.    Therapy/Group: Individual Therapy  Rich Brave 08/16/2019, 9:19 AM

## 2019-08-16 NOTE — Progress Notes (Signed)
Occupational Therapy Weekly Progress Note  Patient Details  Name: Shelly Houston MRN: [id number] Date of Birth: 04-Nov-1970  Beginning of progress report period: August 08, 2019 End of progress report period: August 16, 2019  Patient has met 1 of 5 short term goals.  Pt is making slow functional gains since admission.  Pt requires mod A for rolling in bed and mod/max A for sit>supine. Pt is unable to grasp wash cloth to assist with bathing tasks or pull pants over hips when standing. Pt is dependent for toileting tasks.  Functional transfers and amb without AD at [facility] Pt is dependent for doffing/donning brace. Pt noted with improved BUE proximal AROM.  Patient continues to demonstrate the following deficits: muscle weakness, decreased cardiorespiratoy endurance, impaired timing and sequencing, abnormal tone, unbalanced muscle activation and decreased coordination and decreased sitting balance, decreased standing balance, decreased postural control and decreased balance strategies and therefore will continue to benefit from skilled OT intervention to enhance overall performance with BADL and Reduce care partner burden.  Patient progressing toward long term goals..  Continue plan of care.  OT Short Term Goals Week 1:  OT Short Term Goal 1 (Week 1): patient will complete rolling in bed with min A OT Short Term Goal 1 - Progress (Week 1): Progressing toward goal OT Short Term Goal 2 (Week 1): patient will complete sit to stand with min A of one OT Short Term Goal 2 - Progress (Week 1): Met OT Short Term Goal 3 (Week 1): patient will complete eating and oral care tasks wtih mod A using assistive devices and UE support OT Short Term Goal 3 - Progress (Week 1): Progressing toward goal OT Short Term Goal 4 (Week 1): patient will direct brace management with mod cues OT Short Term Goal 4 - Progress (Week 1): Progressing toward goal OT Short Term Goal 5 (Week 1): patient will increase bilateral UE  proximal AROM by 25 % for increased use in functional tasks and support for mobility Week 2:  OT Short Term Goal 1 (Week 2): patient will complete rolling in bed with min A OT Short Term Goal 2 (Week 2): patient will direct brace management with mod cues OT Short Term Goal 3 (Week 2): patient will complete eating and oral care tasks wtih mod A using assistive devices and UE support OT Short Term Goal 4 (Week 2): patient will increase bilateral UE proximal AROM by 25 % for increased use in functional tasks and support for mobility   Leroy Libman 08/16/2019, 6:23 AM

## 2019-08-16 NOTE — Progress Notes (Signed)
Physical Therapy Session Note  Patient Details  Name: Shelly Houston MRN: [id number] Date of Birth: Apr 09, 1970  Today's Date: 08/16/2019 PT Individual Time: 1400-1500 PT Individual Time Calculation (min): 60 min   Short Term Goals: Week 2:  PT Short Term Goal 1 (Week 2): Pt will complete bed mobility with min A PT Short Term Goal 2 (Week 2): Pt will navigate 6" stairs with min A PT Short Term Goal 3 (Week 2): Pt will maintain static standing balance with Supervision x 5 min  Skilled Therapeutic Interventions/Progress Updates:    Pt received seated in recliner in room, agreeable to PT session. No complaints of pain. Pt reports urge to urinate. Sit to stand with CGA and no AD throughout session. Ambulation into bathroom with CGA. Pt is total A for clothing management, able to void continently while seated on toilet. Pt is total A for pericare and clothing management following toileting. Ambulation to/from therapy gym with CGA for balance. Pt exhibits overall improved gait pattern and balance this date with no instances of LOB. Nustep level 3-5 x 10 min with use of LE only for global strengthening. Seated ball kicks progressing to standing ball kicks with min A for balance for BLE coordination and standing balance training. Monster walks 2 x 20 ft with min A for balance and use of orange theraband for BLE strengthening. Pt left seated in recliner in room with needs in reach, chair alarm in place at end of session. Interpreter present during therapy session.  Therapy Documentation Precautions:  Precautions Precautions: Fall, Back, Cervical Precaution Comments: educated on wear ccollar required at all times Required Braces or Orthoses: Other Brace (L thumb spica) Other Brace: Minerva brace with C-collar Restrictions Weight Bearing Restrictions: No LUE Weight Bearing: Weight bearing as tolerated    Therapy/Group: Individual Therapy   Peter Congo, PT, DPT  08/16/2019, 4:31 PM
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
## 2019-08-16 NOTE — Plan of Care (Signed)
  Problem: RH BOWEL ELIMINATION Goal: RH STG MANAGE BOWEL WITH ASSISTANCE Description: STG Manage Bowel with min Assistance. Outcome: Progressing Goal: RH STG MANAGE BOWEL W/MEDICATION W/ASSISTANCE Description: STG Manage Bowel with Medication with min Assistance. Outcome: Progressing   Problem: RH BLADDER ELIMINATION Goal: RH STG MANAGE BLADDER WITH ASSISTANCE Description: STG Manage Bladder With min Assistance Outcome: Progressing Goal: RH STG MANAGE BLADDER WITH MEDICATION WITH ASSISTANCE Description: STG Manage Bladder With Medication With min Assistance. Outcome: Progressing   Problem: RH SKIN INTEGRITY Goal: RH STG SKIN FREE OF INFECTION/BREAKDOWN Description: Skin to remain free from infection and breakdown with min assist. Outcome: Progressing Goal: RH STG MAINTAIN SKIN INTEGRITY WITH ASSISTANCE Description: STG Maintain Skin Integrity With min Assistance. Outcome: Progressing Goal: RH STG ABLE TO PERFORM INCISION/WOUND CARE W/ASSISTANCE Description: STG Able To Perform Incision/Wound Care With mod Assistance. Outcome: Progressing   Problem: RH SAFETY Goal: RH STG ADHERE TO SAFETY PRECAUTIONS W/ASSISTANCE/DEVICE Description: STG Adhere to Safety Precautions With min Assistance and appropriate assistive Device. Outcome: Progressing Goal: RH STG DECREASED RISK OF FALL WITH ASSISTANCE Description: STG Decreased Risk of Fall With cues and reminders Assistance. Outcome: Progressing   Problem: RH PAIN MANAGEMENT Goal: RH STG PAIN MANAGED AT OR BELOW PT'S PAIN GOAL Description: <4 on a 0-10 pain scale. Outcome: Progressing   Problem: RH KNOWLEDGE DEFICIT GENERAL Goal: RH STG INCREASE KNOWLEDGE OF SELF CARE AFTER HOSPITALIZATION Description: Patient and caregivers will be able to demonstrate knowledge of wound care, dressing changes, medication management, safety precautions, and follow up care with the medical providers with min assist from CIR staff. Outcome:  Progressing

## 2019-08-16 NOTE — Progress Notes (Signed)
Day shift started BP No results at 2100 Pt had medium green type 6 BM at 0220

## 2019-08-16 NOTE — Progress Notes (Signed)
Physical Therapy Session Note  Patient Details  Name: Shelly Houston MRN: [id number] Date of Birth: March 06, 1970  Today's Date: 08/16/2019 PT Individual Time: 1017-5102 PT Individual Time Calculation (min): 59 min   Short Term Goals: Week 2:  PT Short Term Goal 1 (Week 2): Pt will complete bed mobility with min A PT Short Term Goal 2 (Week 2): Pt will navigate 6" stairs with min A PT Short Term Goal 3 (Week 2): Pt will maintain static standing balance with Supervision x 5 min  Skilled Therapeutic Interventions/Progress Updates: Pt presents semi-reclined in bed, but agreeable to therapy, although c/o pain in head of 6/10.  Pt has received pain meds previous, through interpreter who was present throughout session.  Pt required PT assist to thread feet through shorts, although raises LEs to place in.  Pt performs bridging to allow PT to pull shorts over hips.  Pt performs log roll to right w/ min A, but also increased verbal cues for speed, breathing and maintaining log roll and sup to sit w/ mod A.  Pt c/o dizziness and pain w/ seated position and increased serosanguinous drainage right side of scalp dressing.  Pt required return to supine, but stood w/ CGA and sidestepped to [facility].  Pt requires min A for sit to sidelying.  Pt performed LE there ex in supine , including AP, HS, abd/add 3 x 15, while cleaning drainage and new pad.  Nursing reinforced scalp wound.  Pt performed log roll sup to sit and then performed calf raises d/t "some" dizziness. Sling applied to right UE for comfort. Pt transfers sit to stand multiple trials during session from bed and recliner w/ CGA to supervision w/o UE assist.  Pt amb in room approx. 50 w/ turns and CGA,  then amb up to 150' in hallways including turns to return to recliner, verbal cues for objects to right.  Pt remained in recliner w/ LEs elevated and seat alarm on and all needs in reach..  Interpreter present.     Therapy Documentation Precautions:   Precautions Precautions: Fall, Back, Cervical Precaution Comments: educated on wear ccollar required at all times Required Braces or Orthoses: Other Brace (L thumb spica) Other Brace: Minerva brace with C-collar Restrictions Weight Bearing Restrictions: No LUE Weight Bearing: Weight bearing as tolerated General:   Vital Signs:   Pain: 6/10 to head, w/ pain meds given earlier.      Therapy/Group: Individual Therapy  Lucio Edward 08/16/2019, 11:17 AM

## 2019-08-17 ENCOUNTER — Inpatient Hospital Stay (HOSPITAL_COMMUNITY): Payer: Self-pay

## 2019-08-17 ENCOUNTER — Inpatient Hospital Stay (HOSPITAL_COMMUNITY): Payer: Self-pay | Admitting: Physical Therapy

## 2019-08-17 ENCOUNTER — Inpatient Hospital Stay (HOSPITAL_COMMUNITY): Payer: Self-pay | Admitting: Occupational Therapy

## 2019-08-17 MED ORDER — CYCLOBENZAPRINE HCL 5 MG PO TABS
5.0000 mg | ORAL_TABLET | Freq: Three times a day (TID) | ORAL | Status: DC | PRN
  Administered 2019-08-19: 5 mg via ORAL
  Filled 2019-08-17: qty 1

## 2019-08-17 MED ORDER — GABAPENTIN 300 MG PO CAPS
300.0000 mg | ORAL_CAPSULE | Freq: Three times a day (TID) | ORAL | Status: DC
  Administered 2019-08-17 – 2019-08-22 (×15): 300 mg via ORAL
  Filled 2019-08-17 (×15): qty 1

## 2019-08-17 MED ORDER — OXYCODONE HCL ER 15 MG PO T12A: 15.0000 mg | EXTENDED_RELEASE_TABLET | Freq: Two times a day (BID) | ORAL | Status: DC

## 2019-08-17 MED ORDER — OXYCODONE-ACETAMINOPHEN 5-325 MG PO TABS
1.0000 | ORAL_TABLET | Freq: Once | ORAL | Status: AC
  Administered 2019-08-17: 1 via ORAL
  Filled 2019-08-17: qty 1

## 2019-08-17 MED ORDER — OXYCODONE HCL ER 10 MG PO T12A
10.0000 mg | EXTENDED_RELEASE_TABLET | Freq: Two times a day (BID) | ORAL | Status: DC
  Administered 2019-08-17 – 2019-08-29 (×25): 10 mg via ORAL
  Filled 2019-08-17 (×25): qty 1
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
## 2019-08-17 NOTE — Progress Notes (Signed)
Physical Therapy Session Note  Patient Details  Name: Shelly Houston MRN: [id number] Date of Birth: 1970-11-24  Today's Date: 08/17/2019 PT Individual Time: 0900-1015 PT Individual Time Calculation (min): 75 min   Short Term Goals: Week 2:  PT Short Term Goal 1 (Week 2): Pt will complete bed mobility with min A PT Short Term Goal 2 (Week 2): Pt will navigate 6" stairs with min A PT Short Term Goal 3 (Week 2): Pt will maintain static standing balance with Supervision x 5 min  Skilled Therapeutic Interventions/Progress Updates:    Pt received seated in recliner in room, agreeable to PT session. Pt reports 7/10 pain in B shoulders at beginning of session, crying out in pain with mobility. RN able to apply lidocaine patches to B shoulders at beginning of session. Pt initially declines any standing activity or gait due to pain but then decides she is agreeable to attempt. Pt is CGA for sit to stand transfers this date during session with no AD. Ambulation 2 x 200 ft with no AD and CGA, overall improved balance and decreased ataxia noted during gait. Focus on BUE arm swing during gait for overall improved balance and focus on return to functional mobility. Sitting EOM to semi-reclined on mat table on red therapy wedge with min A. Once in semi-reclined position pt is able to perform BLE strengthening therex: heel slides, SAQ, SLR, hip add squeeze 2 x 10 reps each. Pt requires assist x 2 to return to sitting EOM via logroll due to R shoulder pain. Pt then agreeable to attempt standing activity. Ambulation through agility ladder step-to gait pattern with focus on LE marches while taking steps. Pt demos fair ability to follow cues to perform activity correctly. Pt has increase in neck pain and strain with agility ladder due to inability to visualize ladder on floor without significant cervical flexion, deferred further use of agility ladder this session. Pt declines further standing activity due to ongoing  pain. Remainder of session focused on BUE fine motor coordination with use of RUE. Pt initially able to grasp level yellow clothespins with use of RUE but fatigues quickly and unable to functionally maneuver clothespins after grasping 1-2. Pt is able to perform RUE fine motor task grasping pegs and placing in peg board in order to complete a simple pattern with moderate hand-over hand assist. Pt self-limiting at times and requires encouragement to continue to attempt to complete task despite difficulty. Pt also exhibits significant L lateral lean while completing seated task with use of RUE as compensatory mechanism to achieve RUE mobility. Pt becomes emotional and tearful at end of session due to being limited by pain this session and believing she did not perform what therapist was asking of her. Provided emotional support and encouragement to patient. Pt agreeable to stay seated in recliner at end of session, needs in reach. Interpreter present during therapy session.  Therapy Documentation Precautions:  Precautions Precautions: Fall, Back, Cervical Precaution Comments: educated on wear ccollar required at all times Required Braces or Orthoses: Other Brace (L thumb spica) Other Brace: Minerva brace with C-collar Restrictions Weight Bearing Restrictions: No LUE Weight Bearing: Weight bearing as tolerated    Therapy/Group: Individual Therapy   Peter Congo, PT, DPT  08/17/2019, 4:09 PM

## 2019-08-17 NOTE — Progress Notes (Signed)
Patient has been c/o her brace claims it is pinching her ; adjusted her on bed ; per daughter she has been adjusting and taking the brace off. Educated patient and daughter that the brace is placed for a purpose and have to call staff and not to take it off.

## 2019-08-17 NOTE — Plan of Care (Signed)
  Problem: RH BOWEL ELIMINATION Goal: RH STG MANAGE BOWEL WITH ASSISTANCE Description: STG Manage Bowel with min Assistance. Outcome: Not Progressing; bowel program   Problem: RH PAIN MANAGEMENT Goal: RH STG PAIN MANAGED AT OR BELOW PT'S PAIN GOAL Description: <4 on a 0-10 pain scale. Outcome: Not Progressing;MD added pain med in  addition to regimen

## 2019-08-17 NOTE — Progress Notes (Signed)
Bowel program done; results after 20 mins;

## 2019-08-17 NOTE — Progress Notes (Signed)
Occupational Therapy Session Note  Patient Details  Name: Shelly Houston MRN: [id number] Date of Birth: 20-Jan-1971  Today's Date: 08/17/2019 OT Individual Time: 0800-0900 OT Individual Time Calculation (min): 60 min    Short Term Goals: Week 2:  OT Short Term Goal 1 (Week 2): patient will complete rolling in bed with min A OT Short Term Goal 2 (Week 2): patient will direct brace management with mod cues OT Short Term Goal 3 (Week 2): patient will complete eating and oral care tasks wtih mod A using assistive devices and UE support OT Short Term Goal 4 (Week 2): patient will increase bilateral UE proximal AROM by 25 % for increased use in functional tasks and support for mobility  Skilled Therapeutic Interventions/Progress Updates:    Pt resting in bed with RN attending to head wound. Pt agreeable to changing pants and getting OOB. Supine>sit EOB with mod A. Sitting balance with supervision.  Sit<>stand with CGA. Standing balance with CGA.  Pt attempted to perform peri hygiene after brief removed but unable to grasp wash cloth adequately to complete tasks.  Pt required assistance threading pants and pulling pants over hips. Functional transfer to recliner with CGA. Pt c/o hypersensitivity in RUE. Pt noted with increased finger flexion on R hand. Pt requested to rest in recliner.  All needs within reach. Pt c/o incerased pain in back of neck but relieved when pillow provided for support.   Therapy Documentation Precautions:  Precautions Precautions: Fall, Back, Cervical Precaution Comments: educated on wear ccollar required at all times Required Braces or Orthoses: Other Brace (L thumb spica) Other Brace: Minerva brace with C-collar Restrictions Weight Bearing Restrictions: No LUE Weight Bearing: Weight bearing as tolerated Pain: Pt c/o increased pain in back of neck with unsupported sitting; repositioned, RN aware  Therapy/Group: Individual Therapy  Rich Brave 08/17/2019, 9:05 AM

## 2019-08-17 NOTE — Progress Notes (Signed)
Occupational Therapy Session Note  Patient Details  Name: Shelly Houston MRN: [id number] Date of Birth: 07/13/1970  Today's Date: 08/17/2019 OT Individual Time: 1400-1455 OT Individual Time Calculation (min): 55 min    Short Term Goals: Week 2:  OT Short Term Goal 1 (Week 2): patient will complete rolling in bed with min A OT Short Term Goal 2 (Week 2): patient will direct brace management with mod cues OT Short Term Goal 3 (Week 2): patient will complete eating and oral care tasks wtih mod A using assistive devices and UE support OT Short Term Goal 4 (Week 2): patient will increase bilateral UE proximal AROM by 25 % for increased use in functional tasks and support for mobility  Skilled Therapeutic Interventions/Progress Updates:    Patient seated in recliner, she denies pain at this time - later in session provided ice pack for right elbow pain after activity.  Sit to stand and ambulation in room to toilet with CGA.  Max A/dependent for pants down/up and hygiene after toileting.  Tolerated unsupported sitting edge of bed for AROM activities.  Sitting to supine with min A,  Completed shoulder and elbow AROM/AAROM activities in supine position.  Supine to sitting edge of bed min A.  SPT bed to recliner CGA.  Completed spearing/hand to mouth with right using dorsal cuff and putty pieces - HOH required for proximal weakness.  Completed hand ROM activities, pinch and place with right hand.  Completed left hand pinch using digits 2-4 to move and manipulate putty pieces.  She remained seated in recliner at close of session, call bell in hand.    Therapy Documentation Precautions:  Precautions Precautions: Fall, Back, Cervical Precaution Comments: educated on wear ccollar required at all times Required Braces or Orthoses: Other Brace (L thumb spica) Other Brace: Minerva brace with C-collar Restrictions Weight Bearing Restrictions: No LUE Weight Bearing: Weight bearing as  tolerated  Therapy/Group: Individual Therapy  Barrie Lyme 08/17/2019, 7:51 AM

## 2019-08-18 ENCOUNTER — Inpatient Hospital Stay (HOSPITAL_COMMUNITY): Payer: Self-pay | Admitting: Physical Therapy

## 2019-08-18 ENCOUNTER — Inpatient Hospital Stay (HOSPITAL_COMMUNITY): Payer: Self-pay

## 2019-08-18 NOTE — Progress Notes (Signed)
Occupational Therapy Session Note  Patient Details  Name: Shelly Houston MRN: [id number] Date of Birth: 1970/04/21  Today's Date: 08/18/2019 OT Individual Time: 0800-0900 OT Individual Time Calculation (min): 60 min    Short Term Goals: Week 2:  OT Short Term Goal 1 (Week 2): patient will complete rolling in bed with min A OT Short Term Goal 2 (Week 2): patient will direct brace management with mod cues OT Short Term Goal 3 (Week 2): patient will complete eating and oral care tasks wtih mod A using assistive devices and UE support OT Short Term Goal 4 (Week 2): patient will increase bilateral UE proximal AROM by 25 % for increased use in functional tasks and support for mobility  Skilled Therapeutic Interventions/Progress Updates:    Pt resting in bed with interpreter present. Pt required mod A for rolling in bed to sitting EOB. Pt requested use of toilet and amb with RW to bathroom with CGA.  Pt dependent for toileting tasks. Pt returned to room and sat in TIS w/c. Pt required max A for donning pants before eating breakfast. Wrist support brace with U-cuff donned on RUE to facilitate self feeding.  Support provided at elbow and assistance provided for pronation to stab items on breakfast plate. Pt remained in TIS w/c with daughter and interpreter present, awaiting PT therapy.  Therapy Documentation Precautions:  Precautions Precautions: Fall, Back, Cervical Precaution Comments: educated on wear ccollar required at all times Required Braces or Orthoses: Other Brace (L thumb spica) Other Brace: Minerva brace with C-collar Restrictions Weight Bearing Restrictions: No LUE Weight Bearing: Weight bearing as tolerated    Pain:  PT c/o R wrist pan; MD aware and repositioned   Therapy/Group: Individual Therapy  Rich Brave 08/18/2019, 11:49 AM
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
## 2019-08-18 NOTE — Progress Notes (Signed)
Physical Therapy Session Note  Patient Details  Name: Shelly [place] MRN: [id number] Date of Birth: 1970/05/23  Today's Date: 08/18/2019 PT Individual Time: 0900-1015; 1400-1445 PT Individual Time Calculation (min): 75 min and 45 min  Short Term Goals: Week 2:  PT Short Term Goal 1 (Week 2): Pt will complete bed mobility with min A PT Short Term Goal 2 (Week 2): Pt will navigate 6" stairs with min A PT Short Term Goal 3 (Week 2): Pt will maintain static standing balance with Supervision x 5 min  Skilled Therapeutic Interventions/Progress Updates:    Session 1: Pt received seated in TIS w/c in room, agreeable to PT session. No complaints of pain at rest, does have onset of B shoulder and neck pain with mobility. RN notified and able to provide lidocaine patches at end of session. Utilized ice on B hands for pain management as well per pt request. Pt is at [facility] for transfers throughout session. Ambulation to/from therapy gym with CGA. Bed mobility on real bed in simulation apartment: sit to supine with min A for some LE management, supine to sit with mod A for trunk control. Verbal cues for log-rolling technique and to not utilize LUE when performing bed mobility. Per pt she gets in/out of bed on the R side of the bed at home, recommending continueing this due to [facility] precautions with LUE. Ascend/descend 16 x 3" stairs with no handrails and CGA with step-to gait pattern. Progressing to ascending/descending 8 x 6" stairs with no handrails and CGA. Pt however does exhibit cervical flexion as she is watching her LE when navigating stairs. Pt is able to navigate 8 x 6" stairs with no handrails and min A with upright posture and cues to use LE to feel for the edge of the steps. Four-square stepping task with use of hockey sticks and CGA progressing to use of quad canes (increased step height required) and min A. Pt left seated in TIS w/c in room with needs in reach at end of session, ice packs to B  hands.  Session 2: Pt received seated in bed, agreeable to PT session. No complaints of pain. Pt's niece Erie Noe present and per nursing has been assisting pt with transfers. Education with pt and her family that family members need to complete hands-on training from therapy prior to assisting pt. Pt and her niece agreeable to participate in family education. Demonstration of how to assist pt with log-rolling for supine to/from sit transfer which pt can require min to mod A for at times. Reviewed cervical/spinal precautions and NWB LUE precautions with pt's niece. Pt is then CGA for sit to stand, stand pivot, and short distance gait in the room. Pt's niece demonstrates good understanding and return demonstration of how to safely assist the patient. Also demonstrated how to assist pt with donning/doffing RUE sling for comfort. Pt's niece Erie Noe safe to assist pt with transfers in/out of bed and in the room, updated safety plan. Pt agreeable to ambulate to therapy gym for remainder of session. Ambulation at [facility] level 2 x 150 ft. Ambulation 4 x 20 ft with use of orange therband while performing monster walks with CGA for balance. Lateral side-stepping L/R 2 x 20 ft each direction with CGA for balance with v/c for upright stance and decreased cervical flexion. Pt becomes quickly fatigued with standing activities. Seated RUE FMC task using theraputty performing rolling and then pinching with use of thumb and forefinger and then thumb and 3rd digit for performing  opposition with level pink theraputty. Pt requesting to sit up in recliner at end of session, needs in reach, family present. Interpreter present during therapy session.  Therapy Documentation Precautions:  Precautions Precautions: Fall, Back, Cervical Precaution Comments: educated on wear ccollar required at all times Required Braces or Orthoses: Other Brace (L thumb spica) Other Brace: Minerva brace with C-collar Restrictions Weight Bearing  Restrictions: No LUE Weight Bearing: Weight bearing as tolerated    Therapy/Group: Individual Therapy   Excell Seltzer, PT, DPT  08/18/2019, 12:54 PM

## 2019-08-18 NOTE — Progress Notes (Signed)
Suppository given and digitally stimulated and no stool was felt and patient did not have an instant bowel movement.
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
## 2019-08-19 NOTE — Progress Notes (Signed)
Bowel program started at 1820. Dig stim done- rectal vault clear. Suppository administered. At 1850 patient had large continent bowel movement on toilet.

## 2019-08-19 NOTE — Plan of Care (Signed)
  Problem: Consults Goal: RH GENERAL PATIENT EDUCATION Description: See Patient Education module for education specifics. Outcome: Progressing Goal: Skin Care Protocol Initiated - if Braden Score 18 or less Description: If consults are not indicated, leave blank or document N/A Outcome: Progressing Goal: Nutrition Consult-if indicated Outcome: Progressing   Problem: RH BOWEL ELIMINATION Goal: RH STG MANAGE BOWEL WITH ASSISTANCE Description: STG Manage Bowel with min Assistance. Outcome: Progressing Goal: RH STG MANAGE BOWEL W/MEDICATION W/ASSISTANCE Description: STG Manage Bowel with Medication with min Assistance. Outcome: Progressing   Problem: RH BLADDER ELIMINATION Goal: RH STG MANAGE BLADDER WITH ASSISTANCE Description: STG Manage Bladder With min Assistance Outcome: Progressing Goal: RH STG MANAGE BLADDER WITH MEDICATION WITH ASSISTANCE Description: STG Manage Bladder With Medication With min Assistance. Outcome: Progressing   Problem: RH SKIN INTEGRITY Goal: RH STG SKIN FREE OF INFECTION/BREAKDOWN Description: Skin to remain free from infection and breakdown with min assist. Outcome: Progressing Goal: RH STG MAINTAIN SKIN INTEGRITY WITH ASSISTANCE Description: STG Maintain Skin Integrity With min Assistance. Outcome: Progressing Goal: RH STG ABLE TO PERFORM INCISION/WOUND CARE W/ASSISTANCE Description: STG Able To Perform Incision/Wound Care With mod Assistance. Outcome: Progressing   Problem: RH SAFETY Goal: RH STG ADHERE TO SAFETY PRECAUTIONS W/ASSISTANCE/DEVICE Description: STG Adhere to Safety Precautions With min Assistance and appropriate assistive Device. Outcome: Progressing Goal: RH STG DECREASED RISK OF FALL WITH ASSISTANCE Description: STG Decreased Risk of Fall With cues and reminders Assistance. Outcome: Progressing   Problem: RH PAIN MANAGEMENT Goal: RH STG PAIN MANAGED AT OR BELOW PT'S PAIN GOAL Description: <4 on a 0-10 pain scale. Outcome:  Progressing   Problem: RH KNOWLEDGE DEFICIT GENERAL Goal: RH STG INCREASE KNOWLEDGE OF SELF CARE AFTER HOSPITALIZATION Description: Patient and caregivers will be able to demonstrate knowledge of wound care, dressing changes, medication management, safety precautions, and follow up care with the medical providers with min assist from CIR staff. Outcome: Progressing   

## 2019-08-20 ENCOUNTER — Inpatient Hospital Stay (HOSPITAL_COMMUNITY): Payer: Self-pay | Admitting: Occupational Therapy

## 2019-08-20 NOTE — Progress Notes (Signed)
Bowel program started at 1822. Dig stim done and suppository administered. At 1900 patient had medium soft stool on toilet. Patient refused additional dig stim.
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
## 2019-08-20 NOTE — Progress Notes (Signed)
Occupational Therapy Session Note  Patient Details  Name: Shelly Houston MRN: [id number] Date of Birth: 12-09-1970  Today's Date: 08/20/2019 OT Individual Time: 0900-1014 OT Individual Time Calculation (min): 74 min   Short Term Goals: Week 2:  OT Short Term Goal 1 (Week 2): patient will complete rolling in bed with min A OT Short Term Goal 2 (Week 2): patient will direct brace management with mod cues OT Short Term Goal 3 (Week 2): patient will complete eating and oral care tasks wtih mod A using assistive devices and UE support OT Short Term Goal 4 (Week 2): patient will increase bilateral UE proximal AROM by 25 % for increased use in functional tasks and support for mobility    Skilled Therapeutic Interventions/Progress Updates:    Pt greeted in bed, dtr [name] and RN present. RN providing pain medicine. Started session with hands on family training. Discussed with [name] adjusting bed features to set up flat bed in prep for supine<sit to adhere to back precautions using logroll technique. She had practice with this and assisted pt to EOB after helping don gripper socks and shorts. Educated [name] on bending with knees to improve caregiver body mechanics when assisting with stated tasks. [name] provided CGA while pt ambulated without AD into the bathroom. +bladder void with [name] assisting with clothing mgt and hygiene, education provided regarding adaptive technique for donning brief, advised her to purchase similar briefs and a BSC for home use. [name] verbalized understanding. Once pt transferred to the recliner, [name] had to leave. OT printed pt a B UE HEP with modifications marked and highlighted for the Lt hand. Then guided her through active and active assist ROM for NMR of the UEs. Note that we iced her Rt arm before exercises on the Rt side which pt reported helped with participation. Afterwards OT provided her with a wash mitt and provided [facility] assist while she washed face,  set her up with the universal cuff to brush her teeth as well. At end of session pt remained sitting in the recliner with all needs within reach, wearing Rt arm sling for pain mgt.   Interpretor present during session   Therapy Documentation Precautions:  Precautions Precautions: Fall, Back, Cervical Precaution Comments: educated on wear ccollar required at all times Required Braces or Orthoses: Other Brace (L thumb spica) Other Brace: Minerva brace with C-collar Restrictions Weight Bearing Restrictions: No LUE Weight Bearing: Weight bear through elbow only Pain: Pain Assessment Pain Scale: 0-10 Pain Score: 5  Pain Type: Acute pain Pain Location: Neck Pain Descriptors / Indicators: Aching Pain Onset: On-going Pain Intervention(s): Medication (See eMAR) ADL: ADL Eating: Dependent Where Assessed-Eating: Bed level Grooming: Dependent Where Assessed-Grooming: Bed level Upper Body Bathing: Dependent Where Assessed-Upper Body Bathing: Bed level Lower Body Bathing: Dependent Where Assessed-Lower Body Bathing: Bed level Upper Body Dressing: Dependent Where Assessed-Upper Body Dressing: Bed level Toileting: Dependent     Therapy/Group: Individual Therapy  Marty Uy A Mechel Schutter 08/20/2019, 12:15 PM

## 2019-08-20 NOTE — Plan of Care (Signed)
  Problem: Consults Goal: RH GENERAL PATIENT EDUCATION Description: See Patient Education module for education specifics. Outcome: Progressing Goal: Skin Care Protocol Initiated - if Braden Score 18 or less Description: If consults are not indicated, leave blank or document N/A Outcome: Progressing Goal: Nutrition Consult-if indicated Outcome: Progressing   Problem: RH BOWEL ELIMINATION Goal: RH STG MANAGE BOWEL WITH ASSISTANCE Description: STG Manage Bowel with min Assistance. Outcome: Progressing Goal: RH STG MANAGE BOWEL W/MEDICATION W/ASSISTANCE Description: STG Manage Bowel with Medication with min Assistance. Outcome: Progressing   Problem: RH BLADDER ELIMINATION Goal: RH STG MANAGE BLADDER WITH ASSISTANCE Description: STG Manage Bladder With min Assistance Outcome: Progressing Goal: RH STG MANAGE BLADDER WITH MEDICATION WITH ASSISTANCE Description: STG Manage Bladder With Medication With min Assistance. Outcome: Progressing   Problem: RH SKIN INTEGRITY Goal: RH STG SKIN FREE OF INFECTION/BREAKDOWN Description: Skin to remain free from infection and breakdown with min assist. Outcome: Progressing Goal: RH STG MAINTAIN SKIN INTEGRITY WITH ASSISTANCE Description: STG Maintain Skin Integrity With min Assistance. Outcome: Progressing Goal: RH STG ABLE TO PERFORM INCISION/WOUND CARE W/ASSISTANCE Description: STG Able To Perform Incision/Wound Care With mod Assistance. Outcome: Progressing   Problem: RH SAFETY Goal: RH STG ADHERE TO SAFETY PRECAUTIONS W/ASSISTANCE/DEVICE Description: STG Adhere to Safety Precautions With min Assistance and appropriate assistive Device. Outcome: Progressing Goal: RH STG DECREASED RISK OF FALL WITH ASSISTANCE Description: STG Decreased Risk of Fall With cues and reminders Assistance. Outcome: Progressing   Problem: RH PAIN MANAGEMENT Goal: RH STG PAIN MANAGED AT OR BELOW PT'S PAIN GOAL Description: <4 on a 0-10 pain scale. Outcome:  Progressing   Problem: RH KNOWLEDGE DEFICIT GENERAL Goal: RH STG INCREASE KNOWLEDGE OF SELF CARE AFTER HOSPITALIZATION Description: Patient and caregivers will be able to demonstrate knowledge of wound care, dressing changes, medication management, safety precautions, and follow up care with the medical providers with min assist from CIR staff. Outcome: Progressing   

## 2019-08-21 ENCOUNTER — Inpatient Hospital Stay (HOSPITAL_COMMUNITY): Payer: Self-pay | Admitting: Physical Therapy

## 2019-08-21 ENCOUNTER — Inpatient Hospital Stay (HOSPITAL_COMMUNITY): Payer: Self-pay

## 2019-08-21 LAB — BASIC METABOLIC PANEL
Anion gap: 8 (ref 5–15)
BUN: 9 mg/dL (ref 6–20)
CO2: 25 mmol/L (ref 22–32)
Calcium: 9.4 mg/dL (ref 8.9–10.3)
Chloride: 104 mmol/L (ref 98–111)
Creatinine, Ser: 0.53 mg/dL (ref 0.44–1.00)
GFR calc Af Amer: >60 mL/min (ref >60)
GFR calc non Af Amer: >60 mL/min (ref >60)
Glucose, Bld: 97 mg/dL (ref 70–99)
Potassium: 3.9 mmol/L (ref 3.5–5.1)
Sodium: 137 mmol/L (ref 135–145)

## 2019-08-21 NOTE — Progress Notes (Signed)
Physical Therapy Session Note  Patient Details  Name: Shelly Houston MRN: [id number] Date of Birth: 01/09/1971  Today's Date: 08/21/2019 PT Individual Time: 0900-1000; 1400-1500 PT Individual Time Calculation (min): 60 min and 60 min  Short Term Goals: Week 2:  PT Short Term Goal 1 (Week 2): Pt will complete bed mobility with min A PT Short Term Goal 2 (Week 2): Pt will navigate 6" stairs with min A PT Short Term Goal 3 (Week 2): Pt will maintain static standing balance with Supervision x 5 min  Skilled Therapeutic Interventions/Progress Updates:    Session 1: Pt received seated in bed, reports 8/10 pain in shoulders and neck at rest. Pt premedicated prior to start of therapy session. Pt agreeable to therapy session and reports decrease in pain throughout session. Supine to sit with mod A for trunk control. Pt is CGA for sit to stand throughout session. Ambulation to/from therapy session with CGA and no device. Pt continues to exhibit improved control of LE during gait with minimal ataxia and scissoring noted. Nustep level 2-3 x 5 min with use of LE only for global endurance training. Standing balance on Biodex: limits of stability level 2 pt scores 3% and level 1 pt scores 11% then 8%. Pt requires max verbal cueing to complete task correctly as well as min to mod tactile and manual cues in order to correctly perform multidirectional weight shift following visual cues from Biodex machine. Pt demos fair ability to understand task and perform multidirectional weight shifting via use of hip and ankle strategies. Pt left seated in recliner in room with needs in reach, quick release belt and chair alarm in place at end of session. Use of Spanish-language video interpreter during session.  Session 2:  Pt received seated in recliner in room, agreeable to PT session. No complaints of pain. Pt is at [facility] for sit to stand transfers throughout session. Pt requests to use the bathroom. Pt is dependent for  clothing management and pericare following continent void of urine, Supervision for toilet transfer. Ambulation to/from therapy gym with CGA. Session focus on dynamic standing balance with obstacle course. Pt is able to complete obstacle course including ascending/descending 3-6" stairs with no handrails, weaving through cones, stepping over obstacles, ambulation across uneven ground, and standing on airex and balance disc x 30 sec each with CGA overall, min A required for balance disc. Attempt to have pt perform static standing balance task while on balance disc, unable to maintain standing balance and perform task. Pt is able to stand on airex for several minutes while completing peg board task with use of RUE. Pt does exhibit significant L lateral trunk lean in standing due to weak R shoulder musculature. Pt is able to complete peg board task in sitting with support for RUE to prevent L lateral lean. Seated RUE putty task rolling into snake and then performing thumb to 1st, 2nd, and 3rd digit opposition. Pt left seated in recliner in room with needs in reach, quick release belt and chair alarm in place, family present at end of session. Use of Spanish-language video interpreter during session.  Therapy Documentation Precautions:  Precautions Precautions: Fall, Back, Cervical Precaution Comments: educated on wear ccollar required at all times Required Braces or Orthoses: Other Brace (L thumb spica) Other Brace: Minerva brace with C-collar Restrictions Weight Bearing Restrictions: No LUE Weight Bearing: Weight bear through elbow only    Therapy/Group: Individual Therapy   Peter Congo, PT, DPT  08/21/2019, 12:53 PM

## 2019-08-21 NOTE — Progress Notes (Signed)
Occupational Therapy Session Note  Patient Details  Name: Shelly Houston MRN: [id number] Date of Birth: [birth year]  Today's Date: 08/21/2019 OT Individual Time: 2902-1115 OT Individual Time Calculation (min): 35 min  and Today's Date: 08/21/2019 OT Missed Time: 25 Minutes Missed Time Reason: Pain   Short Term Goals: Week 2:  OT Short Term Goal 1 (Week 2): patient will complete rolling in bed with min A OT Short Term Goal 2 (Week 2): patient will direct brace management with mod cues OT Short Term Goal 3 (Week 2): patient will complete eating and oral care tasks wtih mod A using assistive devices and UE support OT Short Term Goal 4 (Week 2): patient will increase bilateral UE proximal AROM by 25 % for increased use in functional tasks and support for mobility  Skilled Therapeutic Interventions/Progress Updates:    Pt resting in bed upon arrival.  Pt stated she was in so much pain in back of neck, B shoulders, and RUE. Pt groaned during all transitional movements.  Pt requested to use toilet and transferred to [facility] placed next to bed with CGA. Pt dependent for donning pants/brief and for hygiene. Pt requested to return to bed with hopes of pain relief.  Pt assisted with repositioning in bed by pushing with BLE. Pillows placed for support of BUE. Pt remained in bed with all needs within reach. Bed alarm activated.  Pt missed 30 mins skilled OT services 2/2 pain.   Therapy Documentation Precautions:  Precautions Precautions: Fall, Back, Cervical Precaution Comments: educated on wear ccollar required at all times Required Braces or Orthoses: Other Brace (L thumb spica) Other Brace: Minerva brace with C-collar Restrictions Weight Bearing Restrictions: No LUE Weight Bearing: Weight bear through elbow only General: General OT Amount of Missed Time: 25 Minutes Vital Signs:   Pain:  Pt c/o 10/10 pain in back of neck and B soulders; meds admin prior to therapy, repositioned, emotional  support   Therapy/Group: Individual Therapy  Rich Brave 08/21/2019, 9:00 AM

## 2019-08-21 NOTE — Plan of Care (Signed)
  Problem: RH SAFETY Goal: RH STG ADHERE TO SAFETY PRECAUTIONS W/ASSISTANCE/DEVICE Description: STG Adhere to Safety Precautions With min Assistance and appropriate assistive Device. Outcome: Progressing Goal: RH STG DECREASED RISK OF FALL WITH ASSISTANCE Description: STG Decreased Risk of Fall With cues and reminders Assistance. Outcome: Progressing   Problem: RH PAIN MANAGEMENT Goal: RH STG PAIN MANAGED AT OR BELOW PT'S PAIN GOAL Description: <4 on a 0-10 pain scale. Outcome: Progressing   Problem: RH BLADDER ELIMINATION Goal: RH STG MANAGE BLADDER WITH ASSISTANCE Description: STG Manage Bladder With min Assistance Outcome: Progressing Goal: RH STG MANAGE BLADDER WITH MEDICATION WITH ASSISTANCE Description: STG Manage Bladder With Medication With min Assistance. Outcome: Progressing
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
## 2019-08-21 NOTE — Progress Notes (Signed)
Patient ID: Shelly Houston, female   DOB: 08-04-1970, 49 y.o.   MRN: [id number]   SW spoke with [facility] and [facility] [phone]) to schedule new patient appointment/hospital follow-up: APpt on July 8 at 2:30pm with Georgian Co PA. SW requested spanish interpreter and to discuss financial assistance with pt.  SW left message for Dennison Bulla [employer] [phone]) to discuss if pt was screened for Medicaid. SW waiting on follow-up.  Cecile Sheerer, MSW, LCSWA Office: [phone] Cell: [phone] Fax: [phone]

## 2019-08-22 ENCOUNTER — Inpatient Hospital Stay (HOSPITAL_COMMUNITY): Payer: Self-pay | Admitting: Physical Therapy

## 2019-08-22 ENCOUNTER — Inpatient Hospital Stay (HOSPITAL_COMMUNITY): Payer: Self-pay

## 2019-08-22 MED ORDER — GABAPENTIN 300 MG PO CAPS
600.0000 mg | ORAL_CAPSULE | Freq: Two times a day (BID) | ORAL | Status: DC
  Administered 2019-08-22 – 2019-08-24 (×4): 600 mg via ORAL
  Filled 2019-08-22 (×4): qty 2

## 2019-08-22 NOTE — Progress Notes (Signed)
This RN was in patients room with Dava Najjar RN to give meds and initiate patients bowel program. After bowel program patients visitors started stating that the brace was hurting the patient since it was pressed up against her skin. This RN explained that the brace needed to be worn at all times but we could adjust the padding to be more comfortable for the patient. Visitor was not responsive to statement and proceeded to un-velcro and un buckle the brace herself. This RN asked her to not do that as it could seriously injure the patient further. Visitor proceeded to mess with brace until this RN asked her to stop and both RN's took over with attempting to adjust brace. Patient was left with visitors in a comfortable position.

## 2019-08-22 NOTE — Progress Notes (Signed)
Physical Therapy Weekly Progress Note  Patient Details  Name: Shelly Houston MRN: [id number] Date of Birth: [birth year]  Beginning of progress report period: August 15, 2019 End of progress report period: August 22, 2019  Today's Date: 08/22/2019  Patient has met 2 of 3 short term goals.  Pt continues to make good progress towards LTG. Pt is currently min to mod A for bed mobility, Supervision to CGA for sit to stand and stand pivot transfers, and Supervision to CGA for gait up to 1000 ft without use of AD. Pt requires CGA for navigating stairs and performs better without use of handrails. Pt remains motivated and exhibits great participation in therapy sessions.  Patient continues to demonstrate the following deficits muscle weakness, decreased cardiorespiratoy endurance, abnormal tone, unbalanced muscle activation and decreased coordination and decreased standing balance, decreased postural control, decreased balance strategies and difficulty maintaining precautions and therefore will continue to benefit from skilled PT intervention to increase functional independence with mobility.  Patient progressing toward long term goals..  Continue plan of care.  PT Short Term Goals Week 2:  PT Short Term Goal 1 (Week 2): Pt will complete bed mobility with min A PT Short Term Goal 1 - Progress (Week 2): Progressing toward goal PT Short Term Goal 2 (Week 2): Pt will navigate 6" stairs with min A PT Short Term Goal 2 - Progress (Week 2): Met PT Short Term Goal 3 (Week 2): Pt will maintain static standing balance with Supervision x 5 min PT Short Term Goal 3 - Progress (Week 2): Met Week 3:  PT Short Term Goal 1 (Week 3): =LTG due to ELOS  Therapy Documentation Precautions:  Precautions Precautions: Fall, Back, Cervical Precaution Comments: educated on wear ccollar required at all times Required Braces or Orthoses: Other Brace (L thumb spica) Other Brace: Minerva brace with C-collar Restrictions  Weight Bearing Restrictions: No LUE Weight Bearing: Weight bear through elbow only   Therapy/Group: Individual Therapy   Excell Seltzer, PT, DPT  08/22/2019, 7:36 AM

## 2019-08-22 NOTE — Patient Care Conference (Signed)
Inpatient RehabilitationTeam Conference and Plan of Care Update Date: 08/22/2019   Time: 4:13 PM    Patient Name: Shelly Houston      Medical Record Number: [id number]  Date of Birth: 10-06-1970 Sex: Female         Room/Bed: 4M06C/4M06C-01 Payor Info: Payor: /    Admit Date/Time:  08/07/2019  6:42 PM  Primary Diagnosis:  Central cord syndrome [facility])  Patient Active Problem List   Diagnosis Date Noted  . Central cord syndrome ([place]) 08/07/2019  . Thoracic spine fracture ([place]) 08/03/2019    Expected Discharge Date: Expected Discharge Date: 08/29/19  Team Members Present: Physician leading conference: Dr. Courtney Heys Care Coodinator Present: Loralee Pacas, LCSWA;Kanishk Stroebel Creig Hines, RN, BSN, [place] Nurse Present: Debroah Loop, RN PT Present: Excell Seltzer, PT OT Present: Willeen Cass, OT;Roanna Epley, COTA SLP Present: Charolett Bumpers, SLP PPS Coordinator present : Gunnar Fusi, SLP     Current Status/Progress Goal Weekly Team Focus  Bowel/Bladder   Continent of bowel and bladder Last BM 08/20/19  Remain continent of bowel and bladder  Assess q shift   Swallow/Nutrition/ Hydration             ADL's   self feeding-mod/max A; max/tot A self care tasks; bed mobility-min/mod A; functional transfers-CGA; toileting-dependent; improved RUE finger flexion  mod A self care  bed mobility, BADL retraining, self feeding, standing balance, RUE NMR, family educaiton   Mobility   min to mod A bed mobility, Supervision to CGA transfers, gait up to 250 ft Supervision to CGA, CGA to min A stairs  Supervision overall  dynamic standing balance, endurance, family edu   Communication             Safety/Cognition/ Behavioral Observations            Pain   c/o pain to bilateral hands 7/10  Pain less than 3  Assess q shift and prn   Skin   Scattered abrasions, bruising, laceration on head with daily dressing changes  Maintain skin integrity, prevent further breakdown, monitor for signs of  infection  Assess q shift    Rehab Goals Patient on target to meet rehab goals: Yes *See Care Plan and progress notes for long and short-term goals.     Barriers to Discharge  Current Status/Progress Possible Resolutions Date Resolved   Nursing                  PT  Weight bearing restrictions                 OT                  SLP                Care Coordinator Other (comments) Uninsured            Discharge Planning/Teaching Needs:  D/c to home with support from various family members  Family education as recommneded by therapy; family edu on 6/24/ and 6/25 with pt dtrs: [name] and [name]   Team Discussion:  Bowel program working well. Doing PVR's and bladder is emptying well. Dressing change should be BID & PRN. Contact guard/supervision goals and progressing well. Total/dependent toileting, Right hand unable to grasp.  Revisions to Treatment Plan:  Right hand and arm so sensitive, however MD request OT try E-stem if pt can tolerate.    Medical Summary Current Status: underreporter- scheduled ultracet/oxy- BMs doing well with bowel program; voiding OK Weekly Focus/Goal:  PT- up to mod A- bed- usually min A- walking- 250 ft S-CGA- higher level balance issues- goals Supervison  Barriers to Discharge: Home enviroment access/layout;Wound care;Weight bearing restrictions;Neurogenic Bowel & Bladder;Incontinence  Barriers to Discharge Comments: d/c 6/29- large Aspen/thoracic extension Possible Resolutions to Barriers: OT- transfers CGA-supervision; ADLs- feeding mod-max A with Ucuff; bath mitt- can do some bathing- dressing Dep; toileting- Total/Dep; R hand gaining some function- can't grasp   Continued Need for Acute Rehabilitation Level of Care: The patient requires daily medical management by a physician with specialized training in physical medicine and rehabilitation for the following reasons: Direction of a multidisciplinary physical rehabilitation program to maximize  functional independence : Yes Medical management of patient stability for increased activity during participation in an intensive rehabilitation regime.: Yes Analysis of laboratory values and/or radiology reports with any subsequent need for medication adjustment and/or medical intervention. : Yes   I attest that I was present, lead the team conference, and concur with the assessment and plan of the team.   Tennis Must 08/22/2019, 4:13 PM

## 2019-08-22 NOTE — Plan of Care (Signed)
  Problem: RH SAFETY Goal: RH STG ADHERE TO SAFETY PRECAUTIONS W/ASSISTANCE/DEVICE Description: STG Adhere to Safety Precautions With min Assistance and appropriate assistive Device. Outcome: Progressing Goal: RH STG DECREASED RISK OF FALL WITH ASSISTANCE Description: STG Decreased Risk of Fall With cues and reminders Assistance. Outcome: Progressing   Problem: RH PAIN MANAGEMENT Goal: RH STG PAIN MANAGED AT OR BELOW PT'S PAIN GOAL Description: <4 on a 0-10 pain scale. Outcome: Progressing   

## 2019-08-22 NOTE — Progress Notes (Signed)
Patient ID: Shelly Houston, female   DOB: 22-Dec-1970, 49 y.o.   MRN: [id number]   08/21/2019- Sw spoke with [employer] who informed she received all documents from pt dtr today, and pt Medicaid application will be sent [facility] DSS.   Cecile Sheerer, MSW, LCSWA Office: [phone] Cell: [phone] Fax: [phone]

## 2019-08-22 NOTE — Progress Notes (Signed)
Physical Therapy Session Note  Patient Details  Name: Shelly Houston MRN: [id number] Date of Birth: 04-03-1970  Today's Date: 08/22/2019 PT Individual Time: 0900-1015; 1400-1455 PT Individual Time Calculation (min): 75 min and 55 min  Short Term Goals: Week 3:  PT Short Term Goal 1 (Week 3): =LTG due to ELOS  Skilled Therapeutic Interventions/Progress Updates:    Session 1: Pt received seated in recliner in room, agreeable to PT session. Pt reports 7/10 pain in head and back at rest, premedicated prior to start of therapy session. Pt is Supervision to CGA for sit to stand transfers throughout session. Ambulation 2 x 250 ft with no AD and Supervision to CGA. Nustep level 3 x 5 min with use of BLE for global endurance training. Patient demonstrates increased fall risk as noted by score of  38/56 on Berg Balance Scale.  (<36= high risk for falls, close to 100%; 37-45 significant >80%; 46-51 moderate >50%; 52-55 lower >25%). Education with patient about Sharlene Motts score and functional implications with regards to safety and fall risk. Pt also performs TUG scoring 12.9 sec average after 3 trials. A score greater than 13.5 sec indicates a high fall risk. Attempt tandem ambulation with no UE support, pt demos poor ability to perform correctly with near LOB requiring min A to recover. Lateral stepping L/R 2 x 30 ft with no UE support initially with orange theraband progressing to use of green theraband with CGA for balance for B hip strengthening. Standing balance on [employer] limits of stability level 1, pt scores 12% then 18% while performing multidirectional weight shift following visual cues from [employer] and min verbal and tactile cueing from therapist. Pt exhibits improved ability to perform this task this date. Pt left seated in recliner in room with needs in reach, quick release belt and chair alarm in place at end of session. Use of Spanish-language video interpreter during session.  Session 2: Pt received  seated in recliner in room, agreeable to PT session. No complaints of pain. Pt is at Supervision to [facility] for all transfers throughout session without use of AD. Ambulation 2 x 1000 ft with close Supervision to CGA through functional environment of hospital, outdoors, up/down inclines, and across uneven ground. Pt exhibits improved overall endurance for gait training as well as improved balance. Pt does exhibit some fatigue following gait requiring seated rest breaks. Sit to/from semi-reclined on wedge on therapy mat with mod A. While in semi-reclined position assisted pt with BUE PROM progressing to [facility] shoulder flexion to tolerance. Pt left seated in recliner in room with needs in reach, quick release belt and chair alarm in place at end of session. Interpreter present during therapy session.  Therapy Documentation Precautions:  Precautions Precautions: Fall, Back, Cervical Precaution Comments: educated on wear ccollar required at all times Required Braces or Orthoses: Other Brace (L thumb spica) Other Brace: Minerva brace with C-collar Restrictions Weight Bearing Restrictions: No LUE Weight Bearing: Weight bear through elbow only Balance: Standardized Balance Assessment Standardized Balance Assessment: Berg Balance Test Berg Balance Test Sit to Stand: Able to stand without using hands and stabilize independently Standing Unsupported: Able to stand safely 2 minutes Sitting with Back Unsupported but Feet Supported on Floor or Stool: Able to sit safely and securely 2 minutes Stand to Sit: Sits safely with minimal use of hands Transfers: Able to transfer safely, minor use of hands Standing Unsupported with Eyes Closed: Able to stand 10 seconds with supervision Standing Ubsupported with Feet Together: Able to place  feet together independently and stand 1 minute safely From Standing, Reach Forward with Outstretched Arm: Loses balance while trying/requires external support From Standing Position,  Pick up Object from Floor: Unable to try/needs assist to keep balance From Standing Position, Turn to Look Behind Over each Shoulder: Needs assist to keep from losing balance and falling Turn 360 Degrees: Able to turn 360 degrees safely in 4 seconds or less Standing Unsupported, Alternately Place Feet on Step/Stool: Able to stand independently and safely and complete 8 steps in 20 seconds Standing Unsupported, One Foot in Front: Able to take small step independently and hold 30 seconds Standing on One Leg: Tries to lift leg/unable to hold 3 seconds but remains standing independently Total Score: 38  Timed Up and Go Test TUG: Normal TUG Normal TUG (seconds): 12.9    Therapy/Group: Individual Therapy   Excell Seltzer, PT, DPT  08/22/2019, 10:45 AM

## 2019-08-22 NOTE — Progress Notes (Signed)
Occupational Therapy Session Note  Patient Details  Name: Shelly Houston MRN: [id number] Date of Birth: [birth year]  Today's Date: 08/22/2019 OT Individual Time: 4481-8563 OT Individual Time Calculation (min): 55 min    Short Term Goals: Week 2:  OT Short Term Goal 1 (Week 2): patient will complete rolling in bed with min A OT Short Term Goal 2 (Week 2): patient will direct brace management with mod cues OT Short Term Goal 3 (Week 2): patient will complete eating and oral care tasks wtih mod A using assistive devices and UE support OT Short Term Goal 4 (Week 2): patient will increase bilateral UE proximal AROM by 25 % for increased use in functional tasks and support for mobility  Skilled Therapeutic Interventions/Progress Updates:    Pt resting in bed upon arrival.  OT intervention with focus on bed mobility, standing balance, RUE functional use for bathing tasks, tub transfers, and discharge planning. Bed mobility with mod A. Sit<>stand and standing balance with CGA.  Pt amb without AD to tub room and practiced tub transfers.  Pt initially attempted stepping into tub but c/o dizziness and nausea.  Following rest and after nausea resolved pt practiced tub bench transfer. Pt agreed tub bench transfer was preferred.  Pt returned to room and sat in recliner. Pt remained in recliner with all needs within reach and belt alarm activated.   Therapy Documentation Precautions:  Precautions Precautions: Fall, Back, Cervical Precaution Comments: educated on wear ccollar required at all times Required Braces or Orthoses: Other Brace (L thumb spica) Other Brace: Minerva brace with C-collar Restrictions Weight Bearing Restrictions: No LUE Weight Bearing: Weight bear through elbow only   Pain:  Pt c/o 4/10 L shoulder pain and RUE hypersensitivity; repositioned  Therapy/Group: Individual Therapy  Rich Brave 08/22/2019, 8:56 AM
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
## 2019-08-23 ENCOUNTER — Inpatient Hospital Stay (HOSPITAL_COMMUNITY): Payer: Self-pay | Admitting: Occupational Therapy

## 2019-08-23 ENCOUNTER — Inpatient Hospital Stay (HOSPITAL_COMMUNITY): Payer: Self-pay

## 2019-08-23 ENCOUNTER — Inpatient Hospital Stay (HOSPITAL_COMMUNITY): Payer: Self-pay | Admitting: Physical Therapy

## 2019-08-23 NOTE — Progress Notes (Signed)
Bowel Program started by dayshift. Pt had continent BM prior to start of program  Pt had continent type 6 green, medium BM
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
## 2019-08-23 NOTE — Progress Notes (Signed)
Occupational Therapy Session Note  Patient Details  Name: Shelly Houston MRN: [id number] Date of Birth: 1971-01-05  Today's Date: 08/23/2019 OT Individual Time: 0800-0900 OT Individual Time Calculation (min): 60 min    Short Term Goals: Week 2:  OT Short Term Goal 1 (Week 2): patient will complete rolling in bed with min A OT Short Term Goal 2 (Week 2): patient will direct brace management with mod cues OT Short Term Goal 3 (Week 2): patient will complete eating and oral care tasks wtih mod A using assistive devices and UE support OT Short Term Goal 4 (Week 2): patient will increase bilateral UE proximal AROM by 25 % for increased use in functional tasks and support for mobility  Skilled Therapeutic Interventions/Progress Updates:    Pt resting in bed upon arrival with daughter, [name], present.  Interpreter joined session after approx 10 mins. OT intervention with focus on bed mobility, sit<>stand, standing balance, LB dressing, and self feeding. Pt's forehead wound draining and RN changed dressing.  Supine>sit EOB with mod A. Sit<>stand and standing balance with CGA. Pt dependent for donning pants. Pt transferred to recliner with CGA.  Self feeding with adapted spoon/fork with mod A for approx 50% of meal.  Pt c/o that feeding self aggravated pain in RUE. Pt dependent for remaining of meal. Pt remained in recliner with belt alarm activated and all needs within reach.  Interpreter present.   Therapy Documentation Precautions:  Precautions Precautions: Fall, Back, Cervical Precaution Comments: educated on wear ccollar required at all times Required Braces or Orthoses: Other Brace (L thumb spica) Other Brace: Minerva brace with C-collar Restrictions Weight Bearing Restrictions: No LUE Weight Bearing: Weight bear through elbow only    Pain:  Pt with c/o RUE/shoulder pain (unrated); repositioned   Therapy/Group: Individual Therapy  Rich Brave 08/23/2019, 12:00  PM

## 2019-08-23 NOTE — Progress Notes (Signed)
Patient requested bowel program be started around 8pm so she could visit with family. This nurse initiated bowel program with one round of dig stim and suppository insertion. Upon return, patient's family member reported patient had BM already after suppository. This nurse completed two additional rounds of dig stim with no stool expelled.

## 2019-08-23 NOTE — Progress Notes (Signed)
Physical Therapy Session Note  Patient Details  Name: Shelly Houston MRN: [id number] Date of Birth: 1970-08-20  Today's Date: 08/23/2019 PT Individual Time: 0900-1000 PT Individual Time Calculation (min): 60 min   Short Term Goals: Week 3:  PT Short Term Goal 1 (Week 3): =LTG due to ELOS  Skilled Therapeutic Interventions/Progress Updates:    Pt received seated in recliner in room, agreeable to PT session. Pt reports 7/10 pain in her head at rest, declines intervention. Pt is Supervision to CGA for sit to stand transfers throughout session. Assisted pt with adjusting her Minerva brace as it had been altered by family per RN. Ambulation 2 x 250 ft with no AD at Supervision to CGA level. Session focus on gait training on treadmill. Pt is able to ambulate for 2 x 5 minutes completing 423 ft and 414 ft with seated rest break between bouts of ambulation on level treadmill at 1.0 mph. Pt noted to have decreased heel strike during gait L>R that she is able to correct with cueing. Pt does exhibit decreased heel strike and DF in LLE with onset of fatigue. Lateral side-stepping on treadmill x 2 min each direction completing 65 ft each direction at 0.4 mph. Pt requires close Supervision to CGA with gait on treadmill. Pt left seated in recliner in room with needs in reach, quick release belt and chair alarm in place at end of session. Interpreter present during therapy session.  Therapy Documentation Precautions:  Precautions Precautions: Fall, Back, Cervical Precaution Comments: educated on wear ccollar required at all times Required Braces or Orthoses: Other Brace (L thumb spica) Other Brace: Minerva brace with C-collar Restrictions Weight Bearing Restrictions: No LUE Weight Bearing: Weight bear through elbow only   Therapy/Group: Individual Therapy   Peter Congo, PT, DPT  08/23/2019, 3:09 PM

## 2019-08-23 NOTE — Progress Notes (Signed)
Occupational Therapy Session Note  Patient Details  Name: Shelly Houston MRN: [id number] Date of Birth: 10-Feb-1971  Today's Date: 08/23/2019 OT Individual Time: 4268-3419 OT Individual Time Calculation (min): 57 min    Short Term Goals: Week 2:  OT Short Term Goal 1 (Week 2): patient will complete rolling in bed with min A OT Short Term Goal 2 (Week 2): patient will direct brace management with mod cues OT Short Term Goal 3 (Week 2): patient will complete eating and oral care tasks wtih mod A using assistive devices and UE support OT Short Term Goal 4 (Week 2): patient will increase bilateral UE proximal AROM by 25 % for increased use in functional tasks and support for mobility  Skilled Therapeutic Interventions/Progress Updates:    Pt in recliner at start of session with her cousin and interpreter present.  She was eager to participate and completed functional mobility down to the therapy gym with min guard assist and no device.  Once in the gym, had her transfer to supine on a wedge for comfort with min assist.  She worked on [employer] for shoulder flexion with 2 sets of 10 repetitions each.  She also completed BUE elbow flexion/extension as well for 2 sets of 10 reps.  She demonstrates greater strength in the biceps of the LUE compared to the RUE, however triceps are slightly stronger on the right compared to the left.  She transitioned to sitting with min assist, where she completed 2 sets of 10 reps for right wrist extension AAROM and scapular adduction bilaterally place and hold for 3 seconds.  Finished session with transition back to the room where she was left sitting in the recliner and BUEs positioned on pillows.  Soft touch call button in reach with safety belt in place.     Therapy Documentation Precautions:  Precautions Precautions: Fall, Back, Cervical Precaution Comments: educated on wear ccollar required at all times Required Braces or Orthoses: Other Brace (thumb spica on  the left hand) Other Brace: Minerva brace with C-collar Restrictions Weight Bearing Restrictions: Yes LUE Weight Bearing:  (weightbear through elbow only)  Pain: Pain Assessment Pain Scale: Faces Faces Pain Scale: No hurt ADL: See Care Tool Section for some details of mobility and selfcare  Therapy/Group: Individual Therapy  Lochlyn Zullo OTR/L 08/23/2019, 4:45 PM

## 2019-08-24 ENCOUNTER — Inpatient Hospital Stay (HOSPITAL_COMMUNITY): Payer: Self-pay | Admitting: Occupational Therapy

## 2019-08-24 ENCOUNTER — Inpatient Hospital Stay (HOSPITAL_COMMUNITY): Payer: Self-pay | Admitting: Physical Therapy

## 2019-08-24 ENCOUNTER — Inpatient Hospital Stay: Payer: Self-pay

## 2019-08-24 ENCOUNTER — Inpatient Hospital Stay (HOSPITAL_COMMUNITY): Payer: Self-pay

## 2019-08-24 MED ORDER — TRIAMCINOLONE ACETONIDE 40 MG/ML IJ SUSP
80.0000 mg | Freq: Once | INTRAMUSCULAR | Status: AC
  Administered 2019-08-24: 80 mg via INTRAMUSCULAR
  Filled 2019-08-24: qty 2

## 2019-08-24 MED ORDER — LIDOCAINE HCL 2 % IJ SOLN
5.0000 mL | Freq: Once | INTRAMUSCULAR | Status: AC
  Administered 2019-08-24: 100 mg
  Filled 2019-08-24 (×2): qty 5

## 2019-08-24 MED ORDER — GABAPENTIN 300 MG PO CAPS
600.0000 mg | ORAL_CAPSULE | Freq: Three times a day (TID) | ORAL | Status: DC
  Administered 2019-08-24 – 2019-08-29 (×15): 600 mg via ORAL
  Filled 2019-08-24 (×15): qty 2

## 2019-08-24 NOTE — Progress Notes (Signed)
Patient ID: Shelly Houston, female   DOB: 1970-12-24, 49 y.o.   MRN: [id number]  SW met with pt and pt daughters: [name] and [name] to provide updates on d/c recommendations, DME: [facility] and 3in[facility]. SW indicated will follow-up once there is more information on if pt will require HH or OPT. Family reports they will look for these items for patient.   *Therapy recommends outpatient PT/OT for patient. SW to discuss with family.  Loralee Pacas, MSW, [place] Office: [phone] Cell: [phone] Fax: [phone]

## 2019-08-24 NOTE — Progress Notes (Signed)
Patient is visiting with family outside at this time and requested suppository to given when she goes to bed.  Will report of to night shift nurse.

## 2019-08-24 NOTE — Progress Notes (Signed)
Occupational Therapy Session Note  Patient Details  Name: NORRINE BALLESTER MRN: [id number] Date of Birth: [birth year]  Today's Date: 08/24/2019 OT Individual Time: 6644-0347 OT Individual Time Calculation (min): 60 min    Short Term Goals: Week 3:  OT Short Term Goal 1 (Week 3): STG=LTG 2/2 ELOS  Skilled Therapeutic Interventions/Progress Updates:   Pt completed functional mobility to the therapy gym with supervision without an assistive device.  Once in the gym, had pt transition to supine on a wedge for BUE strengthening.  Worked on elbow flexion/extension bilaterally as well as wrist extension on the right side.  She was able to perform 10 reps of 2 sets AAROM for each exercise.  Noted pt with stronger biceps on the LUE with stronger triceps on the left.  Did not focus on any shoulder AAROM this session other than reaching task below knee level, secondary to pt just receiving injections in her shoulder.  Had pt work on picking up 3"X1.5" foam blocks with the RUE and placing in container resting on the rolling stool with mod facilitation for elbow flexion and shoulder extension.  Finished session with ambulation back to the room and pt left sitting up in the recliner with call button and phone in reach.  Pt's daughter's present in the room as well.  Interpreter present throughout as well.     Therapy Documentation Precautions:  Precautions Precautions: Fall, Back, Cervical Precaution Booklet Issued: No Precaution Comments: educated on wear ccollar required at all times Required Braces or Orthoses: Other Brace (thumb spica on the left hand) Other Brace: Minerva brace with C-collar Restrictions Weight Bearing Restrictions: No LUE Weight Bearing: Weight bear through elbow only   Pain: Pain Assessment Pain Scale: Faces Pain Score: 0-No pain ADL: See Care Tool Section for some details for mobility  Therapy/Group: Individual Therapy  Valori Hollenkamp OTR/L 08/24/2019, 3:39 PM

## 2019-08-24 NOTE — Progress Notes (Signed)
Occupational Therapy Session Note  Patient Details  Name: Shelly Houston MRN: [id number] Date of Birth: 1970/04/18  Today's Date: 08/24/2019 OT Individual Time: 0165-5374 OT Individual Time Calculation (min): 55 min    Short Term Goals: Week 3:  OT Short Term Goal 1 (Week 3): STG=LTG 2/2 ELOS  Skilled Therapeutic Interventions/Progress Updates:    Pt resting in recliner upon arrival with interpreter present.  Pt commented that she had not received bath at night as scheduled and wanted to wash LB. Pt requested use of toilet and amb without AD (HHA) to bathroom.  Pt dependent for toileting.  Pt returned to room and sat in recliner for LB bathing.  Pt attempted bathing upper legs but commented the task aggravated pain in RUE/hand. Pt tot A for LB bathing. Pt max A for LB dressing tasks.  Pt's daughters [name] and Bosie Clos) arrived with about 20 mins remaining in session.  Educated daughters on MD orders to keep brace on at all times except when supine in bed for bathing and donning clean shirt.  Instructed daughters to NOT assist pt in shower because she could not remove her brace. Both daughters verbalized understanding.  Time did not allow demonstration of removing brace and changing pads.  Will attempt to educate tomorrow. Bosie Clos checked off on assisting pt to bathroom for toileting and assisting to bed. Pt remained in recliner with all needs within reach.  Daughters and interpreter present.   Therapy Documentation Precautions:  Precautions Precautions: Fall, Back, Cervical Precaution Comments: educated on wear ccollar required at all times Required Braces or Orthoses: Other Brace (thumb spica on the left hand) Other Brace: Minerva brace with C-collar Restrictions Weight Bearing Restrictions: Yes LUE Weight Bearing: Weight bear through elbow only Pain:  Pt c/o increased pain in B shoulders (MD aware) and RUE with activity (unrated); repositioned   Therapy/Group: Individual  Therapy  Rich Brave 08/24/2019, 9:04 AM

## 2019-08-24 NOTE — Progress Notes (Signed)
Patient requested bowel program be done after family left from visiting around 20:00. This nurse did dig stim and inserted suppository. Patient expelled no stool during first pass. This nurse returned to finish bowel program, and the patient had already had continent bowel movement. Two additional passes of dig stim completed with no stool expelled.

## 2019-08-24 NOTE — Progress Notes (Signed)
Occupational Therapy Weekly Progress Note  Patient Details  Name: Shelly Houston MRN: [id number] Date of Birth: 07-23-1970  Beginning of progress report period: August 16, 2019 End of progress report period: August 24, 2019  Patient has met 3 of 4 short term goals.  Pt made steady progress with bed mobility, functional transfers, and RUE use for self feeding and oral care.  Pt with improved RUE grasp and able to grasp utensil with foam built up handle. Pt able to grasp tooth brush with foam built up handle. Pt requires mod A with support at elbow to complete feeding and oral care tasks.  Pt's grasp remains weak and unable to grasp pants sufficiently to thread pants or pull over hips.  Pt requires max A for toileting tasks.  Functional transfers with CGA/supervision.  Patient continues to demonstrate the following deficits: muscle weakness, decreased cardiorespiratoy endurance, decreased coordination and decreased sitting balance, decreased standing balance, decreased postural control and decreased balance strategies and therefore will continue to benefit from skilled OT intervention to enhance overall performance with BADL and Reduce care partner burden.  Patient not progressing toward long term goals.  See goal revision..  Plan of care revisions:  OT Short Term Goals Week 2:  OT Short Term Goal 1 (Week 2): patient will complete rolling in bed with min A OT Short Term Goal 1 - Progress (Week 2): Met OT Short Term Goal 2 (Week 2): patient will direct brace management with mod cues OT Short Term Goal 2 - Progress (Week 2): Met OT Short Term Goal 3 (Week 2): patient will complete eating and oral care tasks wtih mod A using assistive devices and UE support OT Short Term Goal 3 - Progress (Week 2): Progressing toward goal OT Short Term Goal 4 (Week 2): patient will increase bilateral UE proximal AROM by 25 % for increased use in functional tasks and support for mobility OT Short Term Goal 4 -  Progress (Week 2): Met Week 3:  OT Short Term Goal 1 (Week 3): STG=LTG 2/2 ELOS  Glade Lloyd 08/24/2019, 6:20 AM
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
## 2019-08-24 NOTE — Progress Notes (Signed)
Physical Therapy Session Note  Patient Details  Name: MAURIANNA BENARD MRN: [id number] Date of Birth: 1970/04/20  Today's Date: 08/24/2019 PT Individual Time: 0900-1000 PT Individual Time Calculation (min): 60 min   Short Term Goals: Week 3:  PT Short Term Goal 1 (Week 3): =LTG due to ELOS  Skilled Therapeutic Interventions/Progress Updates:    Pt received seated in recliner in room, agreeable to PT session. Pt reports 7/10 pain in her head, back, neck, and B shoulders at beginning of session. Pt premedicated prior to start of therapy session, use of repositioning and distraction throughout session for pain management. Pt's daughters present for hands-on family education session. Pt is Supervision for sit to stand transfers throughout session. Ambulation 2 x 250 ft with no AD at Supervision level. Ascend/descend 12 stairs with no handrails and CGA. Car transfer with Supervision. Sit to/from supine on real bed in simulation apartment with min A. Per family report pt may need to use a step to get into her bed due to height. Demonstrated how to safely assist pt with use of 6" curb step. Pt's family demonstrates good understanding of how to safely assist her with all mobility and transfers upon d/c home. Pt's family also with questions with regard to her Minerva brace. Demonstrated how to adjust cervical collar portion, how to adjust mid-trunk portion, and how to lock/unlock brace. Per PA brace to remain in unlocked position for improved pt comfort. Pt's family also asking about showering the patient, reiterated that pt is not to shower until cleared by medical team and that she needs to be wearing the brace at all times. Standing balance while playing [employer] with close Supervision for balance while utilizing RUE to grasp game pieces in order to play game. Pt left seated in recliner in room with needs in reach, family present at end of session.  Therapy Documentation Precautions:   Precautions Precautions: Fall, Back, Cervical Precaution Comments: educated on wear ccollar required at all times Required Braces or Orthoses: Other Brace (thumb spica on the left hand) Other Brace: Minerva brace with C-collar Restrictions Weight Bearing Restrictions: Yes LUE Weight Bearing: Weight bear through elbow only   Therapy/Group: Individual Therapy   Peter Congo, PT, DPT  08/24/2019, 3:06 PM

## 2019-08-25 ENCOUNTER — Ambulatory Visit (HOSPITAL_COMMUNITY): Payer: Self-pay | Admitting: Physical Therapy

## 2019-08-25 ENCOUNTER — Inpatient Hospital Stay (HOSPITAL_COMMUNITY): Payer: Self-pay

## 2019-08-25 MED ORDER — POLYVINYL ALCOHOL 1.4 % OP SOLN
2.0000 [drp] | OPHTHALMIC | Status: DC | PRN
  Filled 2019-08-25: qty 15

## 2019-08-25 NOTE — Progress Notes (Signed)
Occupational Therapy Session Note  Patient Details  Name: Shelly Houston MRN: [id number] Date of Birth: 1970-10-02  Today's Date: 08/25/2019 OT Individual Time: 1430-1500 OT Individual Time Calculation (min): 30 min    Short Term Goals: Week 1:  OT Short Term Goal 1 (Week 1): patient will complete rolling in bed with min A OT Short Term Goal 1 - Progress (Week 1): Progressing toward goal OT Short Term Goal 2 (Week 1): patient will complete sit to stand with min A of one OT Short Term Goal 2 - Progress (Week 1): Met OT Short Term Goal 3 (Week 1): patient will complete eating and oral care tasks wtih mod A using assistive devices and UE support OT Short Term Goal 3 - Progress (Week 1): Progressing toward goal OT Short Term Goal 4 (Week 1): patient will direct brace management with mod cues OT Short Term Goal 4 - Progress (Week 1): Progressing toward goal OT Short Term Goal 5 (Week 1): patient will increase bilateral UE proximal AROM by 25 % for increased use in functional tasks and support for mobility  Skilled Therapeutic Interventions/Progress Updates:    1:1. Pt received in bed with other OTA present. Teaching family how to change pads to minerva brace. Reiterated changing pads in supine for safety/support of C spine. Pt with custom (?) C collar piece under chin that is not supportive. And plastic pressing into skin on collar bones. Coban and foam applied to decrease risk of skin breakdown. Family to call orthotist to readjust d/t chin not being supported. Exited session with pt seated in recliner and family in room c  Therapy Documentation Precautions:  Precautions Precautions: Fall, Back, Cervical Precaution Booklet Issued: No Precaution Comments: educated on wear ccollar required at all times Required Braces or Orthoses: Other Brace (thumb spica on the left hand) Other Brace: Minerva brace with C-collar Restrictions Weight Bearing Restrictions: No LUE Weight Bearing: Weight  bearing as tolerated General:   Vital Signs: Therapy Vitals Pulse Rate: (!) 59 Resp: 16 BP: 120/62 Patient Position (if appropriate): Sitting Oxygen Therapy SpO2: 97 % O2 Device: Room Air Pain:   ADL: ADL Eating: Dependent Where Assessed-Eating: Bed level Grooming: Dependent Where Assessed-Grooming: Bed level Upper Body Bathing: Dependent Where Assessed-Upper Body Bathing: Bed level Lower Body Bathing: Dependent Where Assessed-Lower Body Bathing: Bed level Upper Body Dressing: Dependent Where Assessed-Upper Body Dressing: Bed level Toileting: Dependent Vision   Perception    Praxis   Exercises:   Other Treatments:     Therapy/Group: Individual Therapy  Tonny Branch 08/25/2019, 3:04 PM

## 2019-08-25 NOTE — Progress Notes (Signed)
Patient outside with family at this time. Did not want Suppository at this time. Educated patient and family on dig stem and suppository again this evening.

## 2019-08-25 NOTE — Progress Notes (Signed)
Occupational Therapy Session Note  Patient Details  Name: Shelly Houston MRN: [id number] Date of Birth: Apr 18, 1970  Today's Date: 08/25/2019 OT Individual Time: 1400-1430 OT Individual Time Calculation (min): 30 min    Short Term Goals: Week 2:  OT Short Term Goal 1 (Week 2): patient will complete rolling in bed with min A OT Short Term Goal 1 - Progress (Week 2): Met OT Short Term Goal 2 (Week 2): patient will direct brace management with mod cues OT Short Term Goal 2 - Progress (Week 2): Met OT Short Term Goal 3 (Week 2): patient will complete eating and oral care tasks wtih mod A using assistive devices and UE support OT Short Term Goal 3 - Progress (Week 2): Progressing toward goal OT Short Term Goal 4 (Week 2): patient will increase bilateral UE proximal AROM by 25 % for increased use in functional tasks and support for mobility OT Short Term Goal 4 - Progress (Week 2): Met Week 3:  OT Short Term Goal 1 (Week 3): STG=LTG 2/2 ELOS  Skilled Therapeutic Interventions/Progress Updates:    Pt resting in recliner upon arrival.  Daughters [name] and [name] present.  Interpreter present.  OT intervention with demonstrating removal and replacement of brace and pads.  Emphasized importance of keeping brace on at all time except when changing pads. Pads for cervical portion challenging to replace 2/2 modifications to padding for comfort. OTR entered room for session. OTR continued with education.   Therapy Documentation Precautions:  Precautions Precautions: Fall, Back, Cervical Precaution Booklet Issued: No Precaution Comments: educated on wear ccollar required at all times Required Braces or Orthoses: Other Brace (thumb spica on the left hand) Other Brace: Minerva brace with C-collar Restrictions Weight Bearing Restrictions: No LUE Weight Bearing: Weight bearing as tolerated   Pain:  Pt denies pain this afternoon   Therapy/Group: Individual Therapy  Leroy Libman  08/25/2019, 2:56 PM
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
## 2019-08-25 NOTE — Progress Notes (Signed)
Occupational Therapy Session Note  Patient Details  Name: Shelly Houston MRN: [id number] Date of Birth: Jun 20, 1970  Today's Date: 08/25/2019 OT Individual Time: 9923-4144 OT Individual Time Calculation (min): 55 min    Short Term Goals: Week 3:  OT Short Term Goal 1 (Week 3): STG=LTG 2/2 ELOS  Skilled Therapeutic Interventions/Progress Updates:    Pt resting in recliner upon arrival.  OT intervention with focus on bed mobility on regular bed, TTB tranfsers, furniture transfers, RUE functional use and R hand grasp.  Pt with trace R biceps and deltoid. Pt with 2+/5 finger flexion. Pt engaged in RUE tasks grasping small sponge blocks and placing in cup. Pt required support at elbow to facilitate shoulder adduction. Pt returned to room and remained in relciner with all needs within reach and interpreter present.   Therapy Documentation Precautions:  Precautions Precautions: Fall, Back, Cervical Precaution Booklet Issued: No Precaution Comments: educated on wear ccollar required at all times Required Braces or Orthoses: Other Brace (thumb spica on the left hand) Other Brace: Minerva brace with C-collar Restrictions Weight Bearing Restrictions: No LUE Weight Bearing: Weight bearing as tolerated    Pain: Pt states her shoulder pain and RUE pain are "more better";   Therapy/Group: Individual Therapy  Rich Brave 08/25/2019, 9:01 AM

## 2019-08-25 NOTE — Progress Notes (Addendum)
Physical Therapy Session Note  Patient Details  Name: Shelly Houston MRN: [id number] Date of Birth: [birth year]  Today's Date: 08/25/2019 PT Individual Time: 0900-1015 PT Individual Time Calculation (min): 75 min   Short Term Goals: Week 3:  PT Short Term Goal 1 (Week 3): =LTG due to ELOS  Skilled Therapeutic Interventions/Progress Updates:  Pt received seated in recliner in room with Minerva brace in place. Agreeable to therapy. Denies any pain. Performed sit <> stand at a supervision level throughout session. Ambulated 200 feet to/from therapy gym with supervision. Session focus on gait training on treadmill. Pt is able to ambulate 2 x 5 minutes completing 458 ft and 521 feet with seated rest break between bouts of ambulation (first set at 1.2 mph with incline of 3; second set at 1.2 mph with incline of 5). Lateral side-stepping on level treadmill x 2.5 min each direction completing 100 feet each direction at 0.5 mph. Pt requires close supervision to CGA with gait on treadmill. Performed seated marching on exercise ball to increase balance 1x10 each leg with close supervision. Verbal cues required for correct performance of exercise. Performed seated LAQs on exercise ball to increase balance 2x10 each leg with RUE support on mat table for extra stability with close supervision. LUE reaching outside BOS and across midline while seated on exercise ball with close supervision to improve balance and coordination. Pt is unable to successfully reach with RUE outside BOS and across midline at this time d/t ongoing RUE weakness. Ambulated backwards 3x20 feet with close supervision to increase strength and coordination. Pt left seated in recliner in room with all needs in reach. Interpreter present during therapy session. All questions answered.  Therapy Documentation Precautions:  Precautions Precautions: Fall, Back, Cervical Precaution Booklet Issued: No Precaution Comments: educated on wear ccollar  required at all times Required Braces or Orthoses: Other Brace (thumb spica on the left hand) Other Brace: Minerva brace with C-collar Restrictions Weight Bearing Restrictions: No LUE Weight Bearing: Weight bearing as tolerated  Therapy/Group: Individual Therapy  Murrell Redden, SPT  08/25/2019, 12:59 PM

## 2019-08-25 NOTE — Progress Notes (Signed)
Patient and both daughters educated on bowel program with the dig stem, we reviewed number of times for dig stem and approximate time to wait inbetween. Patient and family verbalized understanding.

## 2019-08-25 NOTE — Progress Notes (Signed)
RN went back to the room to do another digital stimulation and patient stated she needs to use the bathroom. Patient urinated and had a large bowel movement in the toilet. While sitting on the toilet, RN performed digital stimulation and felt no stool in rectal vault. Patient had a small stool afterwards. Pericare done.

## 2019-08-25 NOTE — Progress Notes (Addendum)
Patient positioned in bed and digital stimulation of bowels done without result. No stool felt in the rectal vault. Suppository administered.

## 2019-08-25 NOTE — Plan of Care (Signed)
  Problem: RH Eating Goal: LTG Patient will perform eating w/assist, cues/equip (OT) Description: LTG: Patient will perform eating with assist, with/without cues using equipment (OT) Flowsheets (Taken 08/25/2019 1059) LTG: Pt will perform eating with assistance level of: (downgraded JLS) Moderate Assistance - Patient 50 - 74% Note: downgraded JLS   Problem: RH Bathing Goal: LTG Patient will bathe all body parts with assist levels (OT) Description: LTG: Patient will bathe all body parts with assist levels (OT) Flowsheets (Taken 08/25/2019 1059) LTG: Pt will perform bathing with assistance level/cueing: (downgraded JLS) Maximal Assistance - Patient 25 - 49% Note: downgraded JLS   Problem: RH Dressing Goal: LTG Patient will perform upper body dressing (OT) Description: LTG Patient will perform upper body dressing with assist, with/without cues (OT). Outcome: Not Applicable Flowsheets (Taken 08/25/2019 1059) LTG: Pt will perform upper body dressing with assistance level of: (d/c goal will need total A) -- Note: D/c goal - will require total A  Goal: LTG Patient will perform lower body dressing w/assist (OT) Description: LTG: Patient will perform lower body dressing with assist, with/without cues in positioning using equipment (OT) Flowsheets (Taken 08/25/2019 1059) LTG: Pt will perform lower body dressing with assistance level of: (downgraded JLS) Maximal Assistance - Patient 25 - 49% Note: downgraded JLS   Problem: RH Toileting Goal: LTG Patient will perform toileting task (3/3 steps) with assistance level (OT) Description: LTG: Patient will perform toileting task (3/3 steps) with assistance level (OT)  Flowsheets (Taken 08/25/2019 1059) LTG: Pt will perform toileting task (3/3 steps) with assistance level: (downgraded JLS) Maximal Assistance - Patient 25 - 49% Note: downgraded JLS

## 2019-08-25 NOTE — Progress Notes (Addendum)
Patient ID: Shelly Houston, female   DOB: 1970-10-20, 49 y.o.   MRN: [id number]  SW discussed recommendations for Outpatient PT/OT. SW provided list based on zipcode. SW waiting on follow-up with regard to preferred location.  *SW followed up with pt and family. Preferred OPT location is [facility]. SW to send referral. SW provided pt with new patient appointment card.   Cecile Sheerer, MSW, LCSWA Office: [phone] Cell: [phone] Fax: [phone]

## 2019-08-26 NOTE — Progress Notes (Addendum)
Patient niece at bedside. Education reinforced regarding wound care for head wound. All questions answered. Continue with plan of care.

## 2019-08-26 NOTE — Plan of Care (Signed)
  Problem: Consults Goal: RH GENERAL PATIENT EDUCATION Description: See Patient Education module for education specifics. Outcome: Progressing   Problem: RH BOWEL ELIMINATION Goal: RH STG MANAGE BOWEL WITH ASSISTANCE Description: STG Manage Bowel with min Assistance. Outcome: Progressing Goal: RH STG MANAGE BOWEL W/MEDICATION W/ASSISTANCE Description: STG Manage Bowel with Medication with min Assistance. Outcome: Progressing   Problem: RH BLADDER ELIMINATION Goal: RH STG MANAGE BLADDER WITH ASSISTANCE Description: STG Manage Bladder With min Assistance Outcome: Progressing Goal: RH STG MANAGE BLADDER WITH MEDICATION WITH ASSISTANCE Description: STG Manage Bladder With Medication With min Assistance. Outcome: Progressing   Problem: RH SKIN INTEGRITY Goal: RH STG SKIN FREE OF INFECTION/BREAKDOWN Description: Skin to remain free from infection and breakdown with min assist. Outcome: Progressing Goal: RH STG MAINTAIN SKIN INTEGRITY WITH ASSISTANCE Description: STG Maintain Skin Integrity With min Assistance. Outcome: Progressing   Problem: RH SAFETY Goal: RH STG ADHERE TO SAFETY PRECAUTIONS W/ASSISTANCE/DEVICE Description: STG Adhere to Safety Precautions With min Assistance and appropriate assistive Device. Outcome: Progressing   Problem: RH PAIN MANAGEMENT Goal: RH STG PAIN MANAGED AT OR BELOW PT'S PAIN GOAL Description: <4 on a 0-10 pain scale. Outcome: Progressing

## 2019-08-26 NOTE — Progress Notes (Signed)
Patient daughter and son at bedside. For the bowel program this evening patient's daughter performed dig stim and able to insert suppository. Some additional education provided.

## 2019-08-26 NOTE — Progress Notes (Signed)
Family had to leave early. Patient able to have large soft bowel movement on toilet
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
## 2019-08-26 NOTE — Plan of Care (Signed)
  Problem: Consults Goal: RH GENERAL PATIENT EDUCATION Description: See Patient Education module for education specifics. Outcome: Progressing Goal: Skin Care Protocol Initiated - if Braden Score 18 or less Description: If consults are not indicated, leave blank or document N/A Outcome: Progressing Goal: Nutrition Consult-if indicated Outcome: Progressing   Problem: RH BOWEL ELIMINATION Goal: RH STG MANAGE BOWEL WITH ASSISTANCE Description: STG Manage Bowel with min Assistance. Outcome: Progressing Goal: RH STG MANAGE BOWEL W/MEDICATION W/ASSISTANCE Description: STG Manage Bowel with Medication with min Assistance. Outcome: Progressing   Problem: RH BLADDER ELIMINATION Goal: RH STG MANAGE BLADDER WITH ASSISTANCE Description: STG Manage Bladder With min Assistance Outcome: Progressing Goal: RH STG MANAGE BLADDER WITH MEDICATION WITH ASSISTANCE Description: STG Manage Bladder With Medication With min Assistance. Outcome: Progressing   Problem: RH SKIN INTEGRITY Goal: RH STG SKIN FREE OF INFECTION/BREAKDOWN Description: Skin to remain free from infection and breakdown with min assist. Outcome: Progressing Goal: RH STG MAINTAIN SKIN INTEGRITY WITH ASSISTANCE Description: STG Maintain Skin Integrity With min Assistance. Outcome: Progressing Goal: RH STG ABLE TO PERFORM INCISION/WOUND CARE W/ASSISTANCE Description: STG Able To Perform Incision/Wound Care With mod Assistance. Outcome: Progressing   Problem: RH SAFETY Goal: RH STG ADHERE TO SAFETY PRECAUTIONS W/ASSISTANCE/DEVICE Description: STG Adhere to Safety Precautions With min Assistance and appropriate assistive Device. Outcome: Progressing Goal: RH STG DECREASED RISK OF FALL WITH ASSISTANCE Description: STG Decreased Risk of Fall With cues and reminders Assistance. Outcome: Progressing   Problem: RH PAIN MANAGEMENT Goal: RH STG PAIN MANAGED AT OR BELOW PT'S PAIN GOAL Description: <4 on a 0-10 pain scale. Outcome:  Progressing   Problem: RH KNOWLEDGE DEFICIT GENERAL Goal: RH STG INCREASE KNOWLEDGE OF SELF CARE AFTER HOSPITALIZATION Description: Patient and caregivers will be able to demonstrate knowledge of wound care, dressing changes, medication management, safety precautions, and follow up care with the medical providers with min assist from CIR staff. Outcome: Progressing   

## 2019-08-27 ENCOUNTER — Inpatient Hospital Stay (HOSPITAL_COMMUNITY): Payer: Self-pay | Admitting: Occupational Therapy

## 2019-08-27 NOTE — Progress Notes (Signed)
Patient daughter at bedside. Education on wound care reinforced. Daughter participated in dressing change well. All questions answered.

## 2019-08-27 NOTE — Progress Notes (Signed)
Occupational Therapy Session Note  Patient Details  Name: Shelly Houston MRN: [id number] Date of Birth: 10-31-1970  Today's Date: 08/27/2019 OT Individual Time: 1500-1527 OT Individual Time Calculation (min): 27 min   Short Term Goals: Week 3:  OT Short Term Goal 1 (Week 3): STG=LTG 2/2 ELOS  Skilled Therapeutic Interventions/Progress Updates:    Pt greeted in the recliner with no c/o pain. Daughter, Bosie Clos present. OT demonstrated AAROM technique using home exercise packet in the room. Bosie Clos able to exhibit carryover of understanding with hands on practice afterwards. Also gave pt a ball to squeeze, emphasized to Bosie Clos importance of mindful grasp and release for balanced flexor/extensor strengthening. She verbalized understanding. At end of session pt remained sitting in the recliner with all needs.   Therapy Documentation Precautions:  Precautions Precautions: Fall, Back, Cervical Precaution Booklet Issued: No Precaution Comments: educated on wear ccollar required at all times Required Braces or Orthoses: Other Brace (thumb spica on the left hand) Other Brace: Minerva brace with C-collar Restrictions Weight Bearing Restrictions: Yes LUE Weight Bearing: Weight bear through elbow only Vital Signs: Therapy Vitals Temp: 98.1 F (36.7 C) Pulse Rate: (!) 54 Resp: 17 BP: (!) 144/86 Patient Position (if appropriate): Sitting Oxygen Therapy SpO2: 98 % O2 Device: Room Air ADL: ADL Eating: Dependent Where Assessed-Eating: Bed level Grooming: Dependent Where Assessed-Grooming: Bed level Upper Body Bathing: Dependent Where Assessed-Upper Body Bathing: Bed level Lower Body Bathing: Dependent Where Assessed-Lower Body Bathing: Bed level Upper Body Dressing: Dependent Where Assessed-Upper Body Dressing: Bed level Toileting: Dependent      Therapy/Group: Individual Therapy  Avianna Moynahan A Henretter Piekarski 08/27/2019, 4:07 PM
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
## 2019-08-27 NOTE — Plan of Care (Signed)
  Problem: Consults Goal: RH GENERAL PATIENT EDUCATION Description: See Patient Education module for education specifics. Outcome: Progressing Goal: Skin Care Protocol Initiated - if Braden Score 18 or less Description: If consults are not indicated, leave blank or document N/A Outcome: Progressing Goal: Nutrition Consult-if indicated Outcome: Progressing   Problem: RH BOWEL ELIMINATION Goal: RH STG MANAGE BOWEL WITH ASSISTANCE Description: STG Manage Bowel with max Assistance. Outcome: Progressing Goal: RH STG MANAGE BOWEL W/MEDICATION W/ASSISTANCE Description: STG Manage Bowel with Medication with max Assistance. Outcome: Progressing   Problem: RH BLADDER ELIMINATION Goal: RH STG MANAGE BLADDER WITH ASSISTANCE Description: STG Manage Bladder With min Assistance Outcome: Progressing Goal: RH STG MANAGE BLADDER WITH MEDICATION WITH ASSISTANCE Description: STG Manage Bladder With Medication With min Assistance. Outcome: Progressing   Problem: RH SKIN INTEGRITY Goal: RH STG SKIN FREE OF INFECTION/BREAKDOWN Description: Skin to remain free from infection and breakdown with min assist. Outcome: Progressing Goal: RH STG MAINTAIN SKIN INTEGRITY WITH ASSISTANCE Description: STG Maintain Skin Integrity With min Assistance. Outcome: Progressing Goal: RH STG ABLE TO PERFORM INCISION/WOUND CARE W/ASSISTANCE Description: STG Able To Perform Incision/Wound Care With total Assistance. Outcome: Progressing   Problem: RH SAFETY Goal: RH STG ADHERE TO SAFETY PRECAUTIONS W/ASSISTANCE/DEVICE Description: STG Adhere to Safety Precautions With min Assistance and appropriate assistive Device. Outcome: Progressing Goal: RH STG DECREASED RISK OF FALL WITH ASSISTANCE Description: STG Decreased Risk of Fall With cues and reminders Assistance. Outcome: Progressing   Problem: RH PAIN MANAGEMENT Goal: RH STG PAIN MANAGED AT OR BELOW PT'S PAIN GOAL Description: <4 on a 0-10 pain scale. Outcome:  Progressing   Problem: RH KNOWLEDGE DEFICIT GENERAL Goal: RH STG INCREASE KNOWLEDGE OF SELF CARE AFTER HOSPITALIZATION Description: Patient and caregivers will be able to demonstrate knowledge of wound care, dressing changes, medication management, safety precautions, and follow up care with the medical providers with min assist from CIR staff. Outcome: Progressing

## 2019-08-27 NOTE — Progress Notes (Addendum)
Family not at bedside this evening for bowel program so it was initiated by this RN. Dig stim done and suppository administered about 1826.    At 1900 patient have large soft bowel movement on toilet.

## 2019-08-28 ENCOUNTER — Inpatient Hospital Stay (HOSPITAL_COMMUNITY): Payer: Self-pay | Admitting: Physical Therapy

## 2019-08-28 ENCOUNTER — Inpatient Hospital Stay (HOSPITAL_COMMUNITY): Payer: Self-pay

## 2019-08-28 ENCOUNTER — Inpatient Hospital Stay (HOSPITAL_COMMUNITY): Payer: Self-pay | Admitting: Occupational Therapy

## 2019-08-28 LAB — BASIC METABOLIC PANEL
Anion gap: 9 (ref 5–15)
BUN: 9 mg/dL (ref 6–20)
CO2: 25 mmol/L (ref 22–32)
Calcium: 9.3 mg/dL (ref 8.9–10.3)
Chloride: 104 mmol/L (ref 98–111)
Creatinine, Ser: 0.51 mg/dL (ref 0.44–1.00)
GFR calc Af Amer: >60 mL/min (ref >60)
GFR calc non Af Amer: >60 mL/min (ref >60)
Glucose, Bld: 102 mg/dL — ABNORMAL HIGH (ref 70–99)
Potassium: 3.6 mmol/L (ref 3.5–5.1)
Sodium: 138 mmol/L (ref 135–145)

## 2019-08-28 MED ORDER — HYDROCERIN EX CREA
TOPICAL_CREAM | Freq: Two times a day (BID) | CUTANEOUS | Status: DC
  Filled 2019-08-28: qty 113
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
## 2019-08-28 NOTE — Progress Notes (Signed)
Occupational Therapy Session Note  Patient Details  Name: Shelly Houston MRN: [id number] Date of Birth: 1970-11-24  Today's Date: 08/28/2019 OT Individual Time: 1400-1500 OT Individual Time Calculation (min): 60 min    Short Term Goals: Week 3:  OT Short Term Goal 1 (Week 3): STG=LTG 2/2 ELOS Week 4:    Week 5:     Skilled Therapeutic Interventions/Progress Updates:    Patient seated in recliner - pleasant, cooperative and ready for therapy session.  She reports no pain at this time.  Daughters and interpreter present for session.  Sit to stand and SPT to/from recliner and bed with CS.  Sitting to/from supine with CGA/min A.  Completed bilateral scapula,shoulder, elbow ext AROM/AAROM in supine, elbow flex, right forearm/wrist/hand, left digits in stance - reviewed postioning and assistance needs with daughters.  Reviewed and practiced scapular mobility activities and posture in stance.  She tolerated standing UB activities with CS/CGA for 30 minutes of session.  Upon return to sitting reviewed use of bilateral UEs to promote hand to mouth and options for eating techniques with patient and daughter - they are able to demonstrate understanding.  Patient remained seated in recliner at close of session.   Seat alarm set and call bell in hand.    Therapy Documentation Precautions:  Precautions Precautions: Fall, Back, Cervical Precaution Booklet Issued: No Precaution Comments: educated on wear ccollar required at all times Required Braces or Orthoses: Other Brace (thumb spica on the left hand) Other Brace: Minerva brace with C-collar Restrictions Weight Bearing Restrictions: Yes LUE Weight Bearing: Weight bear through elbow only   Therapy/Group: Individual Therapy  Barrie Lyme 08/28/2019, 7:56 AM
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
## 2019-08-28 NOTE — Progress Notes (Signed)
Occupational Therapy Session Note  Patient Details  Name: Shelly Houston MRN: [id number] Date of Birth: Aug 10, 1970  Today's Date: 08/28/2019 OT Individual Time: 0800-0900 OT Individual Time Calculation (min): 60 min    Short Term Goals: Week 3:  OT Short Term Goal 1 (Week 3): STG=LTG 2/2 ELOS  Skilled Therapeutic Interventions/Progress Updates:    Pt resting in bed upon arrival.  Interpreter present.  Pt c/o B eye irritation/scratchy.  RN attended to pt and resolved with saline and artificial tears. OT intervention with focus on bed mobility, sit<>stand, standing balance, LB dressing, functional amb without AD, BUE PROM/AAROM, and safety awareness to increase independence with BADLs and prepare for discharge tomorrow. Pt requires tot A for LB dressing and is dependent for donning socks and shoes. Bed mobility with supervision.  Standing balance and functional amb with supervision.  Pt amb without AD to gym.  BUE PROM/AAROM for shoulder flexion and elbow flexion/extension. Pt returned to room and remained in recliner with belt alarm activated and all needs within reach.  Interpreter present.   Therapy Documentation Precautions:  Precautions Precautions: Fall, Back, Cervical Precaution Booklet Issued: No Precaution Comments: educated on wear ccollar required at all times Required Braces or Orthoses: Other Brace (thumb spica on the left hand) Other Brace: Minerva brace with C-collar Restrictions Weight Bearing Restrictions: Yes LUE Weight Bearing: Weight bear through elbow only   Pain: Pain Assessment Pain Scale: 0-10 Pain Score: 0-No pain   Therapy/Group: Individual Therapy  Rich Brave 08/28/2019, 9:05 AM

## 2019-08-28 NOTE — Progress Notes (Signed)
Physical Therapy Discharge Summary  Patient Details  Name: Shelly Houston MRN: [id number] Date of Birth: Mar 09, 1970  Today's Date: 08/28/2019 PT Individual Time: 0900-1015 PT Individual Time Calculation (min): 75 min    Patient has met 9 of 9 long term goals due to improved activity tolerance, improved balance, improved postural control, increased strength, decreased pain, ability to compensate for deficits and improved coordination.  Patient to discharge at an ambulatory level Supervision.   Patient's care partner is independent to provide the necessary physical assistance at discharge. Pt's two daughters and niece will be providing care at home.  Reasons goals not met: N/A  Recommendation:  Patient will benefit from ongoing skilled PT services in outpatient setting to continue to advance safe functional mobility, address ongoing impairments in balance, strength, endurance, safety, and to minimize fall risk.  Equipment: No equipment provided  Reasons for discharge: treatment goals met and discharge from hospital  Patient/family agrees with progress made and goals achieved: Yes  Session Note Pt received seated in recliner. Agreeable to therapy. Denies any pain. Performed sit <> stand at a supervision level throughout session. Ambulated 200 feet to/from therapy gym with supervision. Pt is able to ambulate 1 x 5 minutes on treadmill completing 507 feet at 1.2 mph with no incline. Reviewed spinal precautions with pt. Pt given handout of HEP with each exercise demonstrated by therapist before being performed by pt with close supervision-CGA: Squats 1x10, lateral band walks 1x15 feet each direction with orange theraband placed at the ankles, monster walks 1x30 feet with orange theraband placed at the ankles, supine bridges on mat table 1x10, and standing heel raises with R hand on chair for support 1x10. Verbal cues required during heel raises to remind pt not to place weight through L  wrist/hand. Attempted activity where pt would use a reacher to grab bean bags off the floor, but pt was unable to perform this activity safely. Pt advised that if she drops something at home, she should have someone else pick it up for her. Ambulated over a variety of surfaces including ascending/descending a 20 foot ramp and mulch with close supervision to simulate community ambulation. Standing balance on Airex pad with narrow BOS x1 minute with close supervision. Standing balance on Airex pad with narrow BOS and eyes closed x1 minute with close supervision. Pt has occasional LOB which she is able to correct. Standing alternating R/L ball kicks 1x10 each leg with close supervision to improve dynamic balance and coordination. Pt left seated in recliner in room with all needs in reach. All questions answered. A Spanish language interpreter was present throughout the entirety of today's session.  PT Discharge Precautions/Restrictions Precautions Precautions: Fall;Back;Cervical Precaution Booklet Issued: No Precaution Comments: educated on wear ccollar required at all times Required Braces or Orthoses: Other Brace (thumb spica on the L hand) Other Brace: Minerva brace with C-collar Restrictions Weight Bearing Restrictions: Yes LUE Weight Bearing: Non weight bearing Vital Signs Therapy Vitals Temp: 97.9 F (36.6 C) Pulse Rate: (!) 58 Resp: 17 BP: 130/78 Patient Position (if appropriate): Sitting Oxygen Therapy SpO2: 96 % O2 Device: Room Air Cognition Overall Cognitive Status: Within Functional Limits for tasks assessed Arousal/Alertness: Awake/alert Orientation Level: Oriented X4 Attention: Sustained;Focused Memory: Appears intact Awareness: Appears intact Problem Solving: Appears intact Safety/Judgment: Appears intact Sensation Sensation Light Touch: Appears Intact Proprioception: Appears Intact Coordination Gross Motor Movements are Fluid and Coordinated: No Fine Motor Movements  are Fluid and Coordinated: No Motor  Motor Motor: Within Functional Limits Motor -  Skilled Clinical Observations: limited AROM BUE Motor - Discharge Observations: limited AROM BUE  Mobility Bed Mobility Bed Mobility: Rolling Right;Rolling Left;Right Sidelying to Sit;Left Sidelying to Sit;Supine to Sit;Sitting - Scoot to Edge of Bed;Sit to Supine;Sit to Sidelying Right;Sit to Sidelying Left;Scooting to [facility] Rolling Right: Supervision/verbal cueing Rolling Left: Supervision/Verbal cueing Right Sidelying to Sit: Minimal Assistance - Patient > 75% Left Sidelying to Sit: Minimal Assistance - Patient >75% Supine to Sit: Minimal Assistance - Patient > 75% Sitting - Scoot to Edge of Bed: Supervision/Verbal cueing Sit to Supine: Minimal Assistance - Patient > 75% Sit to Sidelying Right: Minimal Assistance - Patient > 75% Sit to Sidelying Left: Minimal Assistance - Patient > 75% Scooting to HOB: Supervision/Verbal Cueing Transfers Transfers: Sit to Stand;Stand to Sit;Stand Pivot Transfers Sit to Stand: Supervision/Verbal cueing Stand to Sit: Supervision/Verbal cueing Stand Pivot Transfers: Supervision/Verbal cueing Locomotion  Gait Ambulation: Yes Gait Assistance: Supervision/Verbal cueing Gait Distance (Feet): 1000 Feet Assistive device: None Gait Gait: Yes Gait Pattern: Within Functional Limits Gait Pattern: Step-through pattern;Wide base of support High Level Ambulation High Level Ambulation: Side stepping;Backwards walking;Direction changes Stairs / Additional Locomotion Stairs: Yes Stairs Assistance: Contact Guard/Touching assist Stair Management Technique: No rails Number of Stairs: 12 Height of Stairs: 6 Ramp: Supervision/Verbal cueing Curb: Supervision/Verbal cueing Wheelchair Mobility Wheelchair Mobility: No  Trunk/Postural Assessment  Cervical Assessment Cervical Assessment: Exceptions to WFL (in C-collar) Thoracic Assessment Thoracic Assessment: Exceptions to WFL (in  Minerva brance) Lumbar Assessment Lumbar Assessment: Exceptions to [facility] (posterior pelvic tilt in sitting) Postural Control Postural Control: Within Functional Limits  Balance Balance Balance Assessed: Yes Static Sitting Balance Static Sitting - Balance Support: Feet supported Static Sitting - Level of Assistance: 7: Independent Dynamic Sitting Balance Dynamic Sitting - Balance Support: Feet supported Dynamic Sitting - Level of Assistance: 5: Stand by assistance Static Standing Balance Static Standing - Balance Support: No upper extremity supported Static Standing - Level of Assistance: 5: Stand by assistance Dynamic Standing Balance Dynamic Standing - Balance Support: No upper extremity supported Dynamic Standing - Level of Assistance: 5: Stand by assistance Extremity Assessment      RLE Assessment RLE Assessment: Within Functional Limits General Strength Comments: 5/5 throughout LLE Assessment LLE Assessment: Within Functional Limits General Strength Comments: 5/5 throughout  Nadeen Landau, SPT  08/28/2019, 4:49 PM

## 2019-08-28 NOTE — Progress Notes (Signed)
Patient ID: Shelly Houston, female   DOB: 09-25-1970, 49 y.o.   MRN: [id number]   SW faxed referral to [facility] [phone]: [phone]).  Cecile Sheerer, MSW, LCSWA Office: [phone] Cell: [phone] Fax: [phone]

## 2019-08-28 NOTE — Plan of Care (Signed)
  Problem: RH SAFETY Goal: RH STG ADHERE TO SAFETY PRECAUTIONS W/ASSISTANCE/DEVICE Description: STG Adhere to Safety Precautions With min Assistance and appropriate assistive Device. Outcome: Progressing Goal: RH STG DECREASED RISK OF FALL WITH ASSISTANCE Description: STG Decreased Risk of Fall With cues and reminders Assistance. Outcome: Progressing   Problem: RH PAIN MANAGEMENT Goal: RH STG PAIN MANAGED AT OR BELOW PT'S PAIN GOAL Description: <4 on a 0-10 pain scale. Outcome: Progressing

## 2019-08-28 NOTE — Progress Notes (Signed)
Educated both patients daughters on how to do bowel program. Explained to daughter how to do it and then had her show and explain back to me.

## 2019-08-29 MED ORDER — BISACODYL 10 MG RE SUPP: 10.0000 mg | Freq: Every day | RECTAL | 0 refills | Status: DC

## 2019-08-29 MED ORDER — GABAPENTIN 300 MG PO CAPS: 600.0000 mg | ORAL_CAPSULE | Freq: Three times a day (TID) | ORAL | 0 refills | Status: DC

## 2019-08-29 MED ORDER — POLYVINYL ALCOHOL 1.4 % OP SOLN: 2.0000 [drp] | OPHTHALMIC | 0 refills | Status: DC | PRN

## 2019-08-29 MED ORDER — CYCLOBENZAPRINE HCL 5 MG PO TABS: 5.0000 mg | ORAL_TABLET | Freq: Three times a day (TID) | ORAL | 0 refills | Status: DC | PRN

## 2019-08-29 MED ORDER — ENOXAPARIN SODIUM 30 MG/0.3ML ~~LOC~~ SOLN: SUBCUTANEOUS | 0 refills | Status: DC

## 2019-08-29 MED ORDER — POLYETHYLENE GLYCOL 3350 17 G PO PACK: 17.0000 g | PACK | Freq: Every day | ORAL | 0 refills | Status: DC

## 2019-08-29 MED ORDER — OXYCODONE HCL ER 10 MG PO T12A: 10.0000 mg | EXTENDED_RELEASE_TABLET | Freq: Two times a day (BID) | ORAL | 0 refills | Status: DC

## 2019-08-29 MED ORDER — ARTIFICIAL TEARS OPHTHALMIC OINT: TOPICAL_OINTMENT | OPHTHALMIC | Status: DC | PRN

## 2019-08-29 MED ORDER — TAMSULOSIN HCL 0.4 MG PO CAPS: 0.4000 mg | ORAL_CAPSULE | Freq: Every day | ORAL | 0 refills | Status: DC

## 2019-08-29 MED ORDER — OXYCODONE HCL 10 MG PO TABS: 5.0000 mg | ORAL_TABLET | Freq: Four times a day (QID) | ORAL | 0 refills | Status: DC | PRN

## 2019-08-29 MED ORDER — POLYSACCHARIDE IRON COMPLEX 150 MG PO CAPS: 150.0000 mg | ORAL_CAPSULE | Freq: Every day | ORAL | 0 refills | Status: DC

## 2019-08-29 MED ORDER — ACETAMINOPHEN 325 MG PO TABS: 325.0000 mg | ORAL_TABLET | ORAL | Status: DC | PRN

## 2019-08-29 MED ORDER — TRAMADOL-ACETAMINOPHEN 37.5-325 MG PO TABS: 2.0000 | ORAL_TABLET | Freq: Three times a day (TID) | ORAL | 0 refills | Status: DC

## 2019-08-29 MED ORDER — BACITRACIN ZINC 500 UNIT/GM EX OINT: TOPICAL_OINTMENT | Freq: Two times a day (BID) | CUTANEOUS | 0 refills | Status: DC

## 2019-08-29 MED ORDER — LIDOCAINE 5 % EX PTCH: 2.0000 | MEDICATED_PATCH | CUTANEOUS | 0 refills | Status: DC

## 2019-08-29 MED ORDER — HYDROCERIN EX CREA: 1.0000 "application " | TOPICAL_CREAM | Freq: Two times a day (BID) | CUTANEOUS | 0 refills | Status: DC

## 2019-08-29 MED FILL — ENOXAPARIN SODIUM 30 MG/0.3: 30 | 5 days supply | Qty: 3 | Fill #1

## 2019-08-29 MED FILL — LIDOCAINE PATCH 5%: 5 | 30 days supply | Qty: 60 | Fill #0

## 2019-08-29 MED FILL — TAMSULOSIN HCL 0.4 MG CAP: 0.4 | 30 days supply | Qty: 30 | Fill #0

## 2019-08-29 MED FILL — GABAPENTIN 300 MG CAPSULE: 300 | 30 days supply | Qty: 180 | Fill #0

## 2019-08-29 MED FILL — CYCLOBENZAPRINE HCL 5 MG TA: 5 | 30 days supply | Qty: 90 | Fill #0

## 2019-08-29 MED FILL — oxyCODONE HCL 10 MG TABS: 10 | 7 days supply | Qty: 28 | Fill #0

## 2019-08-29 MED FILL — SM ANTIBIOTIC 500 UNIT/GM O: 500 UNIT/G | 7 days supply | Qty: 28 | Fill #0

## 2019-08-29 MED FILL — ENOXAPARIN SODIUM 30 MG/0.3: 30 | 30 days supply | Qty: 18 | Fill #0

## 2019-08-29 MED FILL — TRAMADOL-APAP 37.5-325 MG T: 37.5-325 | 6 days supply | Qty: 42 | Fill #0

## 2019-08-29 MED FILL — BISACODYL 10 MG SUPP: 10 | 30 days supply | Qty: 30 | Fill #0

## 2019-08-29 MED FILL — OxyCONTIN 10 MG T12A: 10 | 7 days supply | Qty: 14 | Fill #0

## 2019-08-29 MED FILL — FEROCON CAPSULE: 30 days supply | Qty: 30 | Fill #0

## 2019-08-29 MED FILL — POLYETHYLENE GLYCOL 3350 PO: 17 | 30 days supply | Qty: 510 | Fill #0

## 2019-08-29 NOTE — Progress Notes (Signed)
BP started by dayshift, continent type 5 medium BM @1840   Pt had continent type 5 small BM @2057 

## 2019-08-29 NOTE — Plan of Care (Signed)
  Problem: Consults Goal: RH GENERAL PATIENT EDUCATION Description: See Patient Education module for education specifics. Outcome: Completed/Met Goal: Skin Care Protocol Initiated - if Braden Score 18 or less Description: If consults are not indicated, leave blank or document N/A Outcome: Completed/Met Goal: Nutrition Consult-if indicated Outcome: Completed/Met   Problem: RH BOWEL ELIMINATION Goal: RH STG MANAGE BOWEL WITH ASSISTANCE Description: STG Manage Bowel with max Assistance. Outcome: Completed/Met Goal: RH STG MANAGE BOWEL W/MEDICATION W/ASSISTANCE Description: STG Manage Bowel with Medication with max Assistance. Outcome: Completed/Met   Problem: RH BLADDER ELIMINATION Goal: RH STG MANAGE BLADDER WITH ASSISTANCE Description: STG Manage Bladder With min Assistance Outcome: Completed/Met Goal: RH STG MANAGE BLADDER WITH MEDICATION WITH ASSISTANCE Description: STG Manage Bladder With Medication With min Assistance. Outcome: Completed/Met   Problem: RH SKIN INTEGRITY Goal: RH STG SKIN FREE OF INFECTION/BREAKDOWN Description: Skin to remain free from infection and breakdown with min assist. Outcome: Completed/Met Goal: RH STG MAINTAIN SKIN INTEGRITY WITH ASSISTANCE Description: STG Maintain Skin Integrity With min Assistance. Outcome: Completed/Met Goal: RH STG ABLE TO PERFORM INCISION/WOUND CARE W/ASSISTANCE Description: STG Able To Perform Incision/Wound Care With total Assistance. Outcome: Completed/Met   Problem: RH SAFETY Goal: RH STG ADHERE TO SAFETY PRECAUTIONS W/ASSISTANCE/DEVICE Description: STG Adhere to Safety Precautions With min Assistance and appropriate assistive Device. Outcome: Completed/Met Goal: RH STG DECREASED RISK OF FALL WITH ASSISTANCE Description: STG Decreased Risk of Fall With cues and reminders Assistance. Outcome: Completed/Met   Problem: RH PAIN MANAGEMENT Goal: RH STG PAIN MANAGED AT OR BELOW PT'S PAIN GOAL Description: <4 on a 0-10  pain scale. Outcome: Completed/Met   Problem: RH KNOWLEDGE DEFICIT GENERAL Goal: RH STG INCREASE KNOWLEDGE OF SELF CARE AFTER HOSPITALIZATION Description: Patient and caregivers will be able to demonstrate knowledge of wound care, dressing changes, medication management, safety precautions, and follow up care with the medical providers with min assist from CIR staff. Outcome: Completed/Met

## 2019-08-29 NOTE — Progress Notes (Signed)
Patient ID: Shelly Houston, female   DOB: [birth year], 49 y.o.   MRN: [id number]  SW entered pt in system for [facility] (prescription assistance program).  Cecile Sheerer, MSW, LCSWA Office: [phone] Cell: [phone] Fax: [phone]
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
## 2019-08-30 NOTE — Progress Notes (Signed)
Patient ID: Shelly Houston, female   DOB: 1971-01-13, 49 y.o.   MRN: [id number]  Patient and family received discharge instructions from [facility], PA-C with verbal understanding. Patient discharged to home with family and belongings. Family received education, and returned demonstration successfully to patient's RN. All questions asked and answered.  **Late Entry**

## 2019-08-30 NOTE — Progress Notes (Signed)
Inpatient Rehabilitation Care Coordinator  Discharge Note  The overall goal for the admission was met for:   Discharge location: Yes. D/c to home with support from husband, children, and various family members.    Length of Stay: Yes. 21 days.   Discharge activity level: Yes. Min A to Max A (limited UE; LB bathing/dressing/toileting/hygeience/feeding)  Home/community participation: Yes. Limited.  Services provided included: MD, RD, PT, OT, RN, CM, TR, Pharmacy, Neuropsych and SW  Financial Services: Uninsured/ Medicaid pending  Follow-up services arranged: Outpatient: [facility] for PT/OT and DME: family to purchase 3in[facility] and [facility]TB  New patient appointment/hospital follow-up: July 8 at 2:30pm with Freeman Caldron PA at  [facility] and [facility] [place], [place],  [address] [phone]  Comments (or additional information): contact pt dtr Lexine Baton [phone] or dtr [name] [phone]   Patient/Family verbalized understanding of follow-up arrangements: Yes  Individual responsible for coordination of the follow-up plan: Pt to have assistance with coordinating care needs.   Confirmed correct DME delivered: Rana Snare 08/30/2019    Rana Snare

## 2019-08-30 NOTE — Discharge Instructions (Signed)
    Inpatient Rehab Discharge Instructions  Shelly Houston Discharge date and time:  08/29/19  Activities/Precautions/ Functional Status: Activity: no lifting, driving, or strenuous exercise for till cleared by MD. Need to wear collar/brace at all times. No weight on right arm.  Diet: regular diet Wound Care:  Wash with soap and water. Pat dry and apply bacitracin ointment.     Functional status:  ___ No restrictions     ___ Walk up steps independently _X__ 24/7 supervision/assistance   ___ Walk up steps with assistance ___ Intermittent supervision/assistance  ___ Bathe/dress independently ___ Walk with walker     _X__ Bathe/dress with assistance ___ Walk Independently    ___ Shower independently ___ Walk with assistance    ___ Shower with assistance _X__ No alcohol     ___ Return to work/school ________  Special Instructions: 1. Need to do bowel program after dinner. Can use enema if suppository ineffective.  2. Drink plenty of fluids.  3. Sling on right arm is for comfort--can use as needed when up. Wear splint on right wrist at all times.   REFERRALS UPON DISCHARGE:    Outpatient: PT     OT             Agency: [facility]  Phone: [phone]             Appointment Date/Time:*Please expect follow-up within 7-10 days to schedule your appointment.If you have not received follow-up,be sure to contact the site directly.*  Medical Equipment/Items Ordered: 3in1 BSC and TTB (family to purchase items)                                          GENERAL COMMUNITY RESOURCES FOR PATIENT/FAMILY: New patient appointment/hospital follow-up: July 8 at 2:30pm with Georgian Co PA at  [facility] [address] [place], [place], [place] [address] [phone] *If you need to reschedule, please contact the clinic. Be sure to discuss financial assistance at time of visit.*   My questions have been answered and I understand these  instructions. I will adhere to these goals and the provided educational materials after my discharge from the hospital.  Patient/Caregiver Signature _______________________________ Date __________  Clinician Signature _______________________________________ Date __________  Please bring this form and your medication list with you to all your follow-up doctor's appointments. COMMUNITY

## 2019-09-04 DIAGNOSIS — L24A9 Irritant contact dermatitis due friction or contact with other specified body fluids: Secondary | ICD-10-CM

## 2019-09-04 DIAGNOSIS — M7540 Impingement syndrome of unspecified shoulder: Secondary | ICD-10-CM

## 2019-09-04 DIAGNOSIS — K592 Neurogenic bowel, not elsewhere classified: Secondary | ICD-10-CM

## 2019-09-04 DIAGNOSIS — D62 Acute posthemorrhagic anemia: Secondary | ICD-10-CM

## 2019-09-04 DIAGNOSIS — G629 Polyneuropathy, unspecified: Secondary | ICD-10-CM

## 2019-09-07 ENCOUNTER — Inpatient Hospital Stay: Payer: Self-pay | Admitting: Physician Assistant

## 2019-09-07 ENCOUNTER — Encounter: Payer: Self-pay | Attending: Registered Nurse | Admitting: Registered Nurse

## 2019-09-12 ENCOUNTER — Telehealth (HOSPITAL_COMMUNITY): Payer: Self-pay | Admitting: Specialist

## 2019-09-12 ENCOUNTER — Ambulatory Visit (HOSPITAL_COMMUNITY): Payer: Self-pay | Attending: Physical Medicine and Rehabilitation | Admitting: Specialist

## 2019-09-12 ENCOUNTER — Encounter (HOSPITAL_COMMUNITY): Payer: Self-pay

## 2019-09-12 NOTE — Telephone Encounter (Signed)
Translator called patient to let her know that she missed today's initial evaluation.  Notified her of her next appt for PT Eval on 09/14/19 at 815.  Unable to reach patient, left message via translator with the above information. Shirlean Mylar, MHA, OTR/L [phone]

## 2019-09-14 ENCOUNTER — Ambulatory Visit (HOSPITAL_COMMUNITY): Payer: Self-pay | Admitting: Physical Therapy

## 2019-09-27 ENCOUNTER — Inpatient Hospital Stay: Payer: Self-pay | Admitting: Physician Assistant

## 2019-11-28 ENCOUNTER — Encounter (HOSPITAL_COMMUNITY): Payer: Self-pay

## 2019-11-28 ENCOUNTER — Ambulatory Visit (HOSPITAL_COMMUNITY): Payer: Medicaid Other | Attending: Physical Medicine and Rehabilitation

## 2019-11-28 ENCOUNTER — Other Ambulatory Visit: Payer: Self-pay

## 2019-11-28 DIAGNOSIS — M6281 Muscle weakness (generalized): Secondary | ICD-10-CM | POA: Insufficient documentation

## 2019-11-28 DIAGNOSIS — M25612 Stiffness of left shoulder, not elsewhere classified: Secondary | ICD-10-CM | POA: Insufficient documentation

## 2019-11-28 DIAGNOSIS — R278 Other lack of coordination: Secondary | ICD-10-CM | POA: Diagnosis present

## 2019-11-28 DIAGNOSIS — R29898 Other symptoms and signs involving the musculoskeletal system: Secondary | ICD-10-CM | POA: Insufficient documentation

## 2019-11-28 DIAGNOSIS — M25611 Stiffness of right shoulder, not elsewhere classified: Secondary | ICD-10-CM | POA: Insufficient documentation

## 2019-11-28 DIAGNOSIS — M25512 Pain in left shoulder: Secondary | ICD-10-CM

## 2019-11-28 NOTE — Patient Instructions (Signed)
1) SHOULDER: Flexion On Table   Place hands on towel placed on table, elbows straight. Lean forward with you upper body, pushing towel away from body.  __10_ reps per set, __1-2_ sets per day  2) Abduction (Passive)   With arm out to side, resting on towel placed on table with palm DOWN, keeping trunk away from table, lean to the side while pushing towel away from body.  Repeat __10__ times. Do __1-2__ sessions per day.  Copyright  VHI. All rights reserved.        
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
## 2019-11-30 ENCOUNTER — Ambulatory Visit (HOSPITAL_COMMUNITY): Payer: Medicaid Other | Admitting: Physical Therapy

## 2019-11-30 ENCOUNTER — Encounter (HOSPITAL_COMMUNITY): Payer: Self-pay | Admitting: Physical Therapy

## 2019-11-30 ENCOUNTER — Other Ambulatory Visit: Payer: Self-pay

## 2019-11-30 ENCOUNTER — Ambulatory Visit (HOSPITAL_COMMUNITY): Payer: Medicaid Other | Admitting: Specialist

## 2019-11-30 ENCOUNTER — Encounter (HOSPITAL_COMMUNITY): Payer: Self-pay | Admitting: Specialist

## 2019-11-30 DIAGNOSIS — M25612 Stiffness of left shoulder, not elsewhere classified: Secondary | ICD-10-CM

## 2019-11-30 DIAGNOSIS — R278 Other lack of coordination: Secondary | ICD-10-CM

## 2019-11-30 DIAGNOSIS — R29898 Other symptoms and signs involving the musculoskeletal system: Secondary | ICD-10-CM | POA: Diagnosis not present

## 2019-11-30 DIAGNOSIS — M25611 Stiffness of right shoulder, not elsewhere classified: Secondary | ICD-10-CM

## 2019-11-30 DIAGNOSIS — M25512 Pain in left shoulder: Secondary | ICD-10-CM

## 2019-11-30 DIAGNOSIS — M6281 Muscle weakness (generalized): Secondary | ICD-10-CM

## 2019-11-30 NOTE — Therapy (Signed)
[facility] [address] [place], [place], [address] Phone: [phone]   Fax:  [phone]  Occupational Therapy Treatment  Patient Details  Name: Shelly Houston MRN: [id number] Date of Birth: [birth year] Referring Provider (OT): Delle ReiningPamela Love, [place]   Encounter Date: 11/30/2019   OT End of Session - 11/30/19 1518    Visit Number 2    Number of Visits 16    Date for OT Re-Evaluation 01/23/20   mini reassess on 12/21/19   Authorization Type [employer]    Authorization Time Period requesting 16 visits    OT Start Time 1205    OT Stop Time 1245    OT Time Calculation (min) 40 min    Activity Tolerance Patient tolerated treatment well    Behavior During Therapy [facility] for tasks assessed/performed           History reviewed. No pertinent past medical history.  History reviewed. No pertinent surgical history.  There were no vitals filed for this visit.   Subjective Assessment - 11/30/19 1513    Subjective  S:  My mom and grandson have motiviated me to get better    Patient is accompanied by: Interpreter   Sonia from [employer]   Currently in Pain? Yes    Pain Score 3     Pain Location Arm    Pain Orientation Left    Pain Descriptors / Indicators Aching              [facility] OT Assessment - 11/30/19 1526      Observation/Other Assessments   Other Surveys  Select    Quick DASH  31.82      ROM / Strength   AROM / PROM / Strength PROM      PROM   Overall PROM Comments elbow, forearm, wrist PROM is [facility]    PROM Assessment Site Shoulder    Right/Left Shoulder Right;Left    Right Shoulder Flexion 150 Degrees    Right Shoulder ABduction 140 Degrees    Right Shoulder Internal Rotation 90 Degrees    Right Shoulder External Rotation 70 Degrees    Left Shoulder Flexion 100 Degrees    Left Shoulder ABduction 98 Degrees    Left Shoulder Internal Rotation 60 Degrees    Left Shoulder External Rotation 20 Degrees               Quick Dash - 11/30/19 0001    Open a tight or new jar Mild difficulty    Do heavy household chores (wash walls, wash floors) Mild difficulty    Carry a shopping bag or briefcase Mild difficulty    Wash your back Unable    Use a knife to cut food Mild difficulty    Recreational activities in which you take some force or impact through your arm, shoulder, or hand (golf, hammering, tennis) Mild difficulty    During the past week, to what extent has your arm, shoulder or hand problem interfered with your normal social activities with family, friends, neighbors, or groups? Not at all    During the past week, to what extent has your arm, shoulder or hand problem limited your work or other regular daily activities Modererately    Arm, shoulder, or hand pain. Moderate    Tingling (pins and needles) in your arm, shoulder, or hand None    Difficulty Sleeping Mild difficulty    DASH Score 31.82 %  OT Treatments/Exercises (OP) - 11/30/19 1528      Exercises   Exercises Shoulder      Shoulder Exercises: Supine   External Rotation PROM;10 reps;Left    Internal Rotation PROM;10 reps;Left    Flexion PROM;10 reps;Left    ABduction PROM;10 reps;Left      Shoulder Exercises: Seated   Protraction AAROM;10 reps    Horizontal ABduction AAROM;10 reps   mod facilitation   External Rotation AAROM;10 reps    Internal Rotation AAROM;10 reps    Flexion AAROM;10 reps   wrist flexed on dowel rod    Abduction AAROM;10 reps   mod facilitation   Other Seated Exercises elbow flex/ext, wrist flex/ext, elblow flex/ext 10 times each      Neurological Re-education Exercises   Development of Reach Reaching    Reaching to Shoulder Height patient has isolated functional shoulder flexion to 90, WFL elbow extension and WFL wrist extension, however when combining the 3 movements for functional reaching, she is unable to fully extend elbow and wrist for functional reaching.  Completed  reach and tap activity to varying positions between waist and shoulder height with min-mod pa from OT to depress shoulder and extend elbow and wrist.  completed 2 sets of 10.        Manual Therapy   Manual Therapy Soft tissue mobilization    Manual therapy comments manual therapy completed seperately from all other interventions this date    Soft tissue mobilization soft tissue mobilization to LUE, bilateral upper trap and scapular regions to decrease pain and fascial restrictions that are impeding a/rom                   OT Education - 11/30/19 1517    Education Details reviewed plan of care and goals, patient agreeable to goals as established.  patient has made MD appt with Dr. Merlyn Lot for November.    Person(s) Educated Patient    Methods Explanation    Comprehension Verbalized understanding            OT Short Term Goals - 11/30/19 1522      OT SHORT TERM GOAL #1   Title Patient will be educated and independent with HEP in order to increase functional use of both arms while completing basic self care tasks.    Time 3    Period Weeks    Status On-going    Target Date 12/19/19      OT SHORT TERM GOAL #2   Title Patient will increase her BUE P/ROM to [facility] in order to increase ability to complete upper body dressing tasks with less difficulty.    Time 3    Period Weeks    Status On-going      OT SHORT TERM GOAL #3   Title Patient will increase bilateral hand grip strength by 5# and pinch strength by 2# in order to increase ability to open jars and containers that are not tight.    Time 3    Period Weeks    Status On-going      OT SHORT TERM GOAL #4   Title Patient will increase her left hand coordination by completing the 9 hole peg test in under 1 minute in order to participate in simple housekeeping tasks with less difficulty.    Time 3    Period Weeks    Status On-going             OT Long Term Goals - 11/30/19 1523  OT LONG TERM GOAL #1   Title  Patient will return to using both arms for all daily tasks while using her LUE for 75% of more of daily tasks.    Time 8    Period Weeks    Status On-going      OT LONG TERM GOAL #2   Title Patient will report a decrease in pain in BUE during functional tasks of approximately 3/10 or less.    Time 8    Period Weeks    Status On-going      OT LONG TERM GOAL #3   Title Patient will increase BUE shoulder, wrist, and hand A/ROM to WNL or as close as possible in order to decrease difficulty level during dressing and functional reaching tasks.    Time 8    Period Weeks    Status On-going      OT LONG TERM GOAL #4   Title Patient will increase grip strength in the right hand to 45#, left hand to 10#, and left pinch strength to 5# in order to increase ability to manipulate and open jars and containers without needing assistance.    Time 8    Period Weeks    Status On-going      OT LONG TERM GOAL #5   Title Patient will decrease the bilateral UE fascial restrictions to min amount or less in order to increase her functional mobility needed to complete desire housekeeping tasks.    Time 8    Period Weeks    Status On-going      OT LONG TERM GOAL #6   Title Patient will increase her left hand coordination by completing the 9 hole peg test in 45" or better to allow her to manipulate small items at home such as buttons, zippers, etc.    Time 8    Period Weeks    Status On-going                 Plan - 11/30/19 1519    Clinical Impression Statement A:  Patient motivated to participate in OT.  Patient has limited A/ROM above 100 degrees flexion and abduction of left shoulder.  Paitent demonstrates [facility] wrist extension, elbow extension, and shoulder flexion isolated however when combined for reaching activity, elbow extension and wrist extension are less than functional.  Educated patient on need to focus on full wrist and elbow extension combined for functional reaching.    Body Structure  / Function / Physical Skills ADL;Fascial restriction;UE functional use;Pain;FMC;ROM;GMC;Coordination;Decreased knowledge of precautions    Plan P:  Continue increasing BUE functional movement, continue to improve combined wrist and elbow extension with shoulder flexion during functional reaching.  Follow up on thumb precautions from Dr. Merlyn Lot (LE sent a staff message on 11/28/19). review AA/ROM with dowel and then offer as HEP.           Patient will benefit from skilled therapeutic intervention in order to improve the following deficits and impairments:   Body Structure / Function / Physical Skills: ADL, Fascial restriction, UE functional use, Pain, FMC, ROM, GMC, Coordination, Decreased knowledge of precautions       Visit Diagnosis: Stiffness of left shoulder, not elsewhere classified  Stiffness of right shoulder, not elsewhere classified  Acute pain of left shoulder  Other lack of coordination    Problem List Patient Active Problem List   Diagnosis Date Noted  . Acute blood loss anemia 09/04/2019  . Drainage from wound 09/04/2019  . Rotator cuff  impingement syndrome 09/04/2019  . Neuropathy 09/04/2019  . Neurogenic bowel 09/04/2019  . Central cord syndrome (HCC) 08/07/2019  . Thoracic spine fracture [facility]) 08/03/2019    Shelly Houston, [place], OTR/L [phone]  11/30/2019, 3:34 PM  [facility] [address] [place], [place], [address] Phone: [phone]   Fax:  [phone]  Name: BAHJA BENCE MRN: [id number] Date of Birth: 1970/12/25
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
## 2019-12-01 DIAGNOSIS — Z419 Encounter for procedure for purposes other than remedying health state, unspecified: Secondary | ICD-10-CM | POA: Diagnosis not present

## 2019-12-04 ENCOUNTER — Encounter (HOSPITAL_COMMUNITY): Payer: Medicaid Other | Admitting: Physical Therapy

## 2019-12-04 ENCOUNTER — Encounter (HOSPITAL_COMMUNITY): Payer: Self-pay

## 2019-12-04 ENCOUNTER — Ambulatory Visit (HOSPITAL_COMMUNITY): Payer: Medicaid Other | Attending: Physical Medicine and Rehabilitation

## 2019-12-04 ENCOUNTER — Other Ambulatory Visit: Payer: Self-pay

## 2019-12-04 DIAGNOSIS — M25512 Pain in left shoulder: Secondary | ICD-10-CM | POA: Diagnosis not present

## 2019-12-04 DIAGNOSIS — R278 Other lack of coordination: Secondary | ICD-10-CM | POA: Diagnosis not present

## 2019-12-04 DIAGNOSIS — R29898 Other symptoms and signs involving the musculoskeletal system: Secondary | ICD-10-CM | POA: Diagnosis not present

## 2019-12-04 DIAGNOSIS — M25612 Stiffness of left shoulder, not elsewhere classified: Secondary | ICD-10-CM | POA: Diagnosis not present

## 2019-12-04 DIAGNOSIS — M25611 Stiffness of right shoulder, not elsewhere classified: Secondary | ICD-10-CM | POA: Diagnosis not present

## 2019-12-04 NOTE — Therapy (Addendum)
[facility] [address] [place], [place], [address] Phone: [phone]   Fax:  [phone]  Occupational Therapy Treatment  Patient Details  Name: Shelly Houston MRN: [id number] Date of Birth: 1970/04/22 Referring Provider (OT): Delle Reining, [place]   Encounter Date: 12/04/2019   OT End of Session - 12/04/19 1455    Visit Number 1    Number of Visits 12    Date for OT Re-Evaluation 01/23/20   mini reassess on 12/21/19   Authorization Type [employer]    Authorization Time Period 12 visits approved from 12/01/19- 01/30/20    Authorization - Visit Number 1    Authorization - Number of Visits 12    OT Start Time 1300    OT Stop Time 1346    OT Time Calculation (min) 46 min    Activity Tolerance Patient limited by pain    Behavior During Therapy [facility] for tasks assessed/performed           History reviewed. No pertinent past medical history.  History reviewed. No pertinent surgical history.  There were no vitals filed for this visit.   Subjective Assessment - 12/04/19 1303    Subjective  S: My left seems worse than my right    Patient is accompanied by: Interpreter   Mardene Celeste from [employer]   Currently in Pain? Yes    Pain Score 7     Pain Location Arm    Pain Orientation Left    Pain Descriptors / Indicators Aching    Pain Type Acute pain              [facility] OT Assessment - 12/04/19 1448      Assessment   Medical Diagnosis Central cord syndrome      Precautions   Precautions Cervical;Back;Other (comment)    Precaution Comments Minerva brace with C collar on at all times except to shower.     Required Braces or Orthoses Cervical Brace;Spinal Brace;Other Brace/Splint    Cervical Brace Hard collar;At all times                    OT Treatments/Exercises (OP) - 12/04/19 1304      Exercises   Exercises Shoulder;Elbow;Wrist      Shoulder Exercises: Supine   Protraction PROM;AAROM;10 reps     External Rotation PROM;AAROM;10 reps    Internal Rotation PROM;AAROM;10 reps    Flexion PROM;10 reps;Left    ABduction PROM;AAROM;10 reps      Shoulder Exercises: Seated   Other Seated Exercises AA/ROM with PVC pipe for elbow flex/ext, wrist flex/ext, 10 times each      Elbow Exercises   Elbow Extension AAROM;Both;10 reps    Other elbow exercises AA/ROM elbow flexion, 10X      Wrist Exercises   Wrist Flexion AAROM;Both;10 reps    Wrist Extension AAROM;Both;10 reps    Other wrist exercises AA/ROM completed at table top using palm down on small green ball to promote flexion and extension of wrist      Functional Reaching Activities   Mid Level Reaching to place, then remove 3 cones from mid level height      Manual Therapy   Manual Therapy Soft tissue mobilization    Manual therapy comments manual therapy completed seperately from all other interventions this date    Soft tissue mobilization soft tissue mobilization to LUE, bilateral upper trap and scapular regions to decrease pain and fascial restrictions that are impeding a/rom  OT Education - 12/04/19 1501    Education Details HEP for AA/ROM for wrist and elbow flexion/extension, discussed how tightness in shoulder and UE can limit overall A/ROM    Person(s) Educated Patient    Methods Explanation;Demonstration;Tactile cues;Verbal cues;Handout    Comprehension Verbalized understanding;Returned demonstration;Verbal cues required;Tactile cues required;Need further instruction            OT Short Term Goals - 11/30/19 1522      OT SHORT TERM GOAL #1   Title Patient will be educated and independent with HEP in order to increase functional use of both arms while completing basic self care tasks.    Time 3    Period Weeks    Status On-going    Target Date 12/19/19      OT SHORT TERM GOAL #2   Title Patient will increase her BUE P/ROM to [facility] in order to increase ability to complete upper body dressing  tasks with less difficulty.    Time 3    Period Weeks    Status On-going      OT SHORT TERM GOAL #3   Title Patient will increase bilateral hand grip strength by 5# and pinch strength by 2# in order to increase ability to open jars and containers that are not tight.    Time 3    Period Weeks    Status On-going      OT SHORT TERM GOAL #4   Title Patient will increase her left hand coordination by completing the 9 hole peg test in under 1 minute in order to participate in simple housekeeping tasks with less difficulty.    Time 3    Period Weeks    Status On-going             OT Long Term Goals - 11/30/19 1523      OT LONG TERM GOAL #1   Title Patient will return to using both arms for all daily tasks while using her LUE for 75% of more of daily tasks.    Time 8    Period Weeks    Status On-going      OT LONG TERM GOAL #2   Title Patient will report a decrease in pain in BUE during functional tasks of approximately 3/10 or less.    Time 8    Period Weeks    Status On-going      OT LONG TERM GOAL #3   Title Patient will increase BUE shoulder, wrist, and hand A/ROM to WNL or as close as possible in order to decrease difficulty level during dressing and functional reaching tasks.    Time 8    Period Weeks    Status On-going      OT LONG TERM GOAL #4   Title Patient will increase grip strength in the right hand to 45#, left hand to 10#, and left pinch strength to 5# in order to increase ability to manipulate and open jars and containers without needing assistance.    Time 8    Period Weeks    Status On-going      OT LONG TERM GOAL #5   Title Patient will decrease the bilateral UE fascial restrictions to min amount or less in order to increase her functional mobility needed to complete desire housekeeping tasks.    Time 8    Period Weeks    Status On-going      OT LONG TERM GOAL #6   Title Patient will increase her left  hand coordination by completing the 9 hole peg  test in 45" or better to allow her to manipulate small items at home such as buttons, zippers, etc.    Time 8    Period Weeks    Status On-going                 Plan - 12/04/19 1502    Clinical Impression Statement A: Patient limited by pain throughout skilled OT session today. Manual techniques primarily focused on LUE, with mod to max fascial restrictions palpated on upper arm and trapezius regions. Attempted AA/ROM seated, with report of increased pain and inability to raise LUE with proper form, transitioned to supine AA/ROM with patient able to complete shoulder protraction, abduction and er/IR. Completed functional reaching task, with patient demonstrating flexed elbow and wrist while shoulder shrugging to reach mid to high level surface. Patient not ready to complete seated AA/ROM at home at this time due to decreased form and increased pain. Verbal and tactile cues required for form and technique throughout.    Occupational performance deficits (Please refer to evaluation for details): ADL's;IADL's;Leisure    Body Structure / Function / Physical Skills ADL;Fascial restriction;UE functional use;Pain;FMC;ROM;GMC;Coordination;Decreased knowledge of precautions    OT Frequency 2x / week    OT Duration 6 weeks    Plan P: follow up on wrist and elbow AA/ROM HEP, continue manual techniques, AA/ROM supine attempt seated if tolerable, functional reaching with focus on elbow and wrist extension throughout    OT Home Exercise Plan eval: table slides (flexion, abduction), 10/4: AA/ROM wrist and elbow (flexion, extension)    Consulted and Agree with Plan of Care Patient           Patient will benefit from skilled therapeutic intervention in order to improve the following deficits and impairments:   Body Structure / Function / Physical Skills: ADL, Fascial restriction, UE functional use, Pain, FMC, ROM, GMC, Coordination, Decreased knowledge of precautions       Visit  Diagnosis: Stiffness of left shoulder, not elsewhere classified  Stiffness of right shoulder, not elsewhere classified  Acute pain of left shoulder  Other lack of coordination    Problem List Patient Active Problem List   Diagnosis Date Noted  . Acute blood loss anemia 09/04/2019  . Drainage from wound 09/04/2019  . 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 Neuropathy 09/04/2019  . Neurogenic bowel 09/04/2019  . Central cord syndrome (HCC) 08/07/2019  . Thoracic spine fracture [facility]) 08/03/2019     [place], [place] Student [phone]   12/04/2019, 5:35 PM  [facility] [address] [place], [place], [address] Phone: [phone]   Fax:  [phone]  Name: LOYALTY ARENTZ MRN: [id number] Date of Birth: 04-27-1970

## 2019-12-07 ENCOUNTER — Telehealth (HOSPITAL_COMMUNITY): Payer: Self-pay

## 2019-12-07 ENCOUNTER — Encounter (HOSPITAL_COMMUNITY): Payer: Medicaid Other

## 2019-12-07 ENCOUNTER — Ambulatory Visit (HOSPITAL_COMMUNITY): Payer: Medicaid Other

## 2019-12-07 NOTE — Telephone Encounter (Signed)
pt cancelled because they are stuck in traffic

## 2019-12-11 ENCOUNTER — Encounter (HOSPITAL_COMMUNITY): Payer: Medicaid Other | Admitting: Physical Therapy

## 2019-12-14 ENCOUNTER — Encounter (HOSPITAL_COMMUNITY): Payer: Self-pay | Admitting: Occupational Therapy

## 2019-12-14 ENCOUNTER — Other Ambulatory Visit: Payer: Self-pay

## 2019-12-14 ENCOUNTER — Ambulatory Visit (HOSPITAL_COMMUNITY): Payer: Medicaid Other | Admitting: Occupational Therapy

## 2019-12-14 ENCOUNTER — Encounter (HOSPITAL_COMMUNITY): Payer: Medicaid Other | Admitting: Physical Therapy

## 2019-12-14 DIAGNOSIS — R29898 Other symptoms and signs involving the musculoskeletal system: Secondary | ICD-10-CM | POA: Diagnosis not present

## 2019-12-14 DIAGNOSIS — M25612 Stiffness of left shoulder, not elsewhere classified: Secondary | ICD-10-CM

## 2019-12-14 DIAGNOSIS — M25611 Stiffness of right shoulder, not elsewhere classified: Secondary | ICD-10-CM | POA: Diagnosis not present

## 2019-12-14 DIAGNOSIS — R278 Other lack of coordination: Secondary | ICD-10-CM

## 2019-12-14 DIAGNOSIS — M25512 Pain in left shoulder: Secondary | ICD-10-CM

## 2019-12-14 NOTE — Therapy (Signed)
[facility] [address] [place], [place], [address] Phone: [phone]   Fax:  [phone]  Occupational Therapy Treatment  Patient Details  Name: Shelly Houston MRN: [id number] Date of Birth: [birth year] Referring Provider (OT): Delle Reining, [place]   Encounter Date: 12/14/2019   OT End of Session - 12/14/19 1650    Visit Number 4    Number of Visits 12    Date for OT Re-Evaluation 01/23/20   mini reassess on 12/21/19   Authorization Type [employer]    Authorization Time Period 12 visits approved from 12/01/19- 01/30/20    Authorization - Visit Number 2    Authorization - Number of Visits 12    OT Start Time 1435    OT Stop Time 1515    OT Time Calculation (min) 40 min    Activity Tolerance Patient limited by pain    Behavior During Therapy [facility] for tasks assessed/performed           History reviewed. No pertinent past medical history.  History reviewed. No pertinent surgical history.  There were no vitals filed for this visit.   Subjective Assessment - 12/14/19 1434    Subjective  S: I can grip more with my left hand now.    Patient is accompanied by: Interpreter   Sonia   Currently in Pain? No/denies              [facility] OT Assessment - 12/14/19 1432      Assessment   Medical Diagnosis Central cord syndrome      Precautions   Precautions Cervical;Back;Other (comment)    Precaution Comments Minerva brace with C collar on at all times except to shower.     Required Braces or Orthoses Cervical Brace;Spinal Brace;Other Brace/Splint    Cervical Brace Hard collar;At all times    Spinal Brace Other (comment)   see above                   OT Treatments/Exercises (OP) - 12/14/19 1447      Exercises   Exercises Shoulder;Elbow;Wrist      Shoulder Exercises: Supine   Protraction PROM;AAROM;10 reps    External Rotation PROM;5 reps;AROM;10 reps    Internal Rotation PROM;5 reps;AROM;10 reps    Flexion PROM;5  reps;AROM;10 reps    ABduction PROM;AAROM;10 reps;Left      Elbow Exercises   Elbow Extension AROM;10 reps   bicep, hammer curl, prontated   Other elbow exercises A/ROM flexion, 10X   bicep, hammer curl, prontated     Functional Reaching Activities   Mid Level Pt placing all squigz on overhead cabinet with RUE, removing with LUE. Cuing for positioning and keeping heels on the floor when possible.       Manual Therapy   Manual Therapy Soft tissue mobilization    Manual therapy comments manual therapy completed seperately from all other interventions this date    Soft tissue mobilization soft tissue mobilization to LUE, bilateral upper trap and scapular regions to decrease pain and fascial restrictions that are impeding a/rom                     OT Short Term Goals - 11/30/19 1522      OT SHORT TERM GOAL #1   Title Patient will be educated and independent with HEP in order to increase functional use of both arms while completing basic self care tasks.    Time 3    Period  Weeks    Status On-going    Target Date 12/19/19      OT SHORT TERM GOAL #2   Title Patient will increase her BUE P/ROM to [facility] in order to increase ability to complete upper body dressing tasks with less difficulty.    Time 3    Period Weeks    Status On-going      OT SHORT TERM GOAL #3   Title Patient will increase bilateral hand grip strength by 5# and pinch strength by 2# in order to increase ability to open jars and containers that are not tight.    Time 3    Period Weeks    Status On-going      OT SHORT TERM GOAL #4   Title Patient will increase her left hand coordination by completing the 9 hole peg test in under 1 minute in order to participate in simple housekeeping tasks with less difficulty.    Time 3    Period Weeks    Status On-going             OT Long Term Goals - 11/30/19 1523      OT LONG TERM GOAL #1   Title Patient will return to using both arms for all daily tasks while  using her LUE for 75% of more of daily tasks.    Time 8    Period Weeks    Status On-going      OT LONG TERM GOAL #2   Title Patient will report a decrease in pain in BUE during functional tasks of approximately 3/10 or less.    Time 8    Period Weeks    Status On-going      OT LONG TERM GOAL #3   Title Patient will increase BUE shoulder, wrist, and hand A/ROM to WNL or as close as possible in order to decrease difficulty level during dressing and functional reaching tasks.    Time 8    Period Weeks    Status On-going      OT LONG TERM GOAL #4   Title Patient will increase grip strength in the right hand to 45#, left hand to 10#, and left pinch strength to 5# in order to increase ability to manipulate and open jars and containers without needing assistance.    Time 8    Period Weeks    Status On-going      OT LONG TERM GOAL #5   Title Patient will decrease the bilateral UE fascial restrictions to min amount or less in order to increase her functional mobility needed to complete desire housekeeping tasks.    Time 8    Period Weeks    Status On-going      OT LONG TERM GOAL #6   Title Patient will increase her left hand coordination by completing the 9 hole peg test in 45" or better to allow her to manipulate small items at home such as buttons, zippers, etc.    Time 8    Period Weeks    Status On-going                 Plan - 12/14/19 1650    Clinical Impression Statement A: Manual techniques completed at beginning of session to address fascial restrictions. Continued with AA/ROM in supine, cuing for pushing LUE into elbow extension as much as possible during tasks. Progressed to elbow A/ROM sitting, no difficulty for pt. Added functional reaching task with squigz, pt able to lightly grasp  and pull off cabinets with left hand, unable to push onto cabinets with left hand. Verbal cuing for form and technique during session.    Body Structure / Function / Physical Skills  ADL;Fascial restriction;UE functional use;Pain;FMC;ROM;GMC;Coordination;Decreased knowledge of precautions    Plan P: Complete AA/ROM in sitting and update for HEP is pt completing with good form. Grasp activity    OT Home Exercise Plan eval: table slides (flexion, abduction), 10/4: AA/ROM wrist and elbow (flexion, extension)    Consulted and Agree with Plan of Care Patient           Patient will benefit from skilled therapeutic intervention in order to improve the following deficits and impairments:   Body Structure / Function / Physical Skills: ADL, Fascial restriction, UE functional use, Pain, FMC, ROM, GMC, Coordination, Decreased knowledge of precautions       Visit Diagnosis: Stiffness of left shoulder, not elsewhere classified  Stiffness of right shoulder, not elsewhere classified  Acute pain of left shoulder  Other lack of coordination  Other symptoms and signs involving the musculoskeletal system    Problem List Patient Active Problem List   Diagnosis Date Noted  . Acute blood loss anemia 09/04/2019  . Drainage from wound 09/04/2019  . 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 Neuropathy 09/04/2019  . Neurogenic bowel 09/04/2019  . Central cord syndrome (HCC) 08/07/2019  . Thoracic spine fracture [facility]) 08/03/2019   Ezra Sites, OTR/L  [phone] 12/14/2019, 4:53 PM  [facility] [address] [place], [place], [address] Phone: [phone]   Fax:  [phone]  Name: Shelly Houston MRN: [id number] Date of Birth: Oct 03, 1970

## 2019-12-18 ENCOUNTER — Encounter (HOSPITAL_COMMUNITY): Payer: Medicaid Other | Admitting: Physical Therapy

## 2019-12-21 ENCOUNTER — Encounter (HOSPITAL_COMMUNITY): Payer: Self-pay

## 2019-12-21 ENCOUNTER — Ambulatory Visit (HOSPITAL_COMMUNITY): Payer: Medicaid Other

## 2019-12-21 ENCOUNTER — Other Ambulatory Visit: Payer: Self-pay

## 2019-12-21 ENCOUNTER — Encounter (HOSPITAL_COMMUNITY): Payer: Medicaid Other | Admitting: Physical Therapy

## 2019-12-21 DIAGNOSIS — R29898 Other symptoms and signs involving the musculoskeletal system: Secondary | ICD-10-CM

## 2019-12-21 DIAGNOSIS — M25512 Pain in left shoulder: Secondary | ICD-10-CM

## 2019-12-21 DIAGNOSIS — R278 Other lack of coordination: Secondary | ICD-10-CM

## 2019-12-21 DIAGNOSIS — M25611 Stiffness of right shoulder, not elsewhere classified: Secondary | ICD-10-CM | POA: Diagnosis not present

## 2019-12-21 DIAGNOSIS — M25612 Stiffness of left shoulder, not elsewhere classified: Secondary | ICD-10-CM | POA: Diagnosis not present

## 2019-12-21 NOTE — Therapy (Addendum)
[facility] [address] [place], [place], [address] Phone: [phone]   Fax:  [phone]  Occupational Therapy Treatment  Patient Details  Name: Shelly Houston MRN: [id number] Date of Birth: August 16, 1970 Referring Provider (OT): Delle Reining, [place]   Encounter Date: 12/21/2019   OT End of Session - 12/21/19 1352    Visit Number 5    Number of Visits 12    Date for OT Re-Evaluation 01/23/20    Authorization Type [employer]    Authorization Time Period 12 visits approved from 12/01/19- 01/30/20    Authorization - Visit Number 3    Authorization - Number of Visits 12    OT Start Time 1347   mini reassess   OT Stop Time 1433    OT Time Calculation (min) 46 min    Activity Tolerance Patient tolerated treatment well    Behavior During Therapy [facility] for tasks assessed/performed           History reviewed. No pertinent past medical history.  History reviewed. No pertinent surgical history.  There were no vitals filed for this visit.   Subjective Assessment - 12/21/19 1352    Subjective  S: I can't get in touch with the doctor    Patient is accompanied by: Interpreter    Currently in Pain? No/denies              [facility] OT Assessment - 12/21/19 1353      Assessment   Medical Diagnosis Central cord syndrome      Precautions   Precautions Cervical;Back;Other (comment)    Precaution Comments Minerva brace with C collar on at all times except to shower.     Required Braces or Orthoses Cervical Brace;Spinal Brace;Other Brace/Splint    Cervical Brace Hard collar;At all times    Spinal Brace Other (comment)      Coordination   9 Hole Peg Test Left    Left 9 Hole Peg Test 59.37"   previous 1'08"     ROM / Strength   AROM / PROM / Strength AROM;PROM;Strength      Palpation   Palpation comment moderate fascial restrictions in bilateral upper arms, trapezius, and scapularis region. max fascial restrictions noted in left  hand/wrist region.       AROM   Overall AROM Comments Assessed seated. IR/er adducted    AROM Assessment Site Shoulder    Right/Left Shoulder Right;Left    Right Shoulder Flexion 115 Degrees   preivous 93   Right Shoulder ABduction 85 Degrees   previous 50   Right Shoulder Internal Rotation 90 Degrees   previous 90   Right Shoulder External Rotation 80 Degrees   previous 80   Left Shoulder Flexion 80 Degrees   previous 65   Left Shoulder ABduction 75 Degrees   previous 45   Left Shoulder Internal Rotation 90 Degrees   previous 90   Left Shoulder External Rotation 54 Degrees   previous 54   Right/Left Wrist Left    Left Wrist Extension 70 Degrees   previous 52   Left Wrist Flexion 64 Degrees   previous 62   Left Wrist Radial Deviation 22 Degrees   previous 22   Left Wrist Ulnar Deviation 38 Degrees   previous 38     Strength   Strength Assessment Site Shoulder    Right/Left Shoulder Right;Left    Right Shoulder Flexion 3/5   previous 3-/5   Right Shoulder ABduction 3/5   previous 3-/5  Right Shoulder Internal Rotation 3/5   previous 3/5   Right Shoulder External Rotation 4-/5   previous 3/5   Left Shoulder Flexion 3/5   previous 3-/5   Left Shoulder ABduction 3-/5   previous 3-/5   Left Shoulder Internal Rotation 3/5   previous 3/5   Left Shoulder External Rotation 3/5   previous 3/5   Right/Left Forearm Right;Left    Right Forearm Pronation 3+/5   previous- 3/5   Right Forearm Supination 3+/5   previous 3+/5   Left Forearm Pronation 3/5   previous- 3/5   Left Forearm Supination 3/5   previous 3/5   Right/Left Wrist Right;Left    Right Wrist Flexion 3+/5   previous 3/5   Right Wrist Extension 3+/5   previous 3/5   Right Wrist Radial Deviation 3+/5   previous 3/5   Right Wrist Ulnar Deviation 3+/5   previous 3/5   Left Wrist Flexion 3/5   previous 3/5   Left Wrist Extension 3/5   previous 3/5   Left Wrist Radial Deviation 3/5   previous 3/5   Left Wrist Ulnar Deviation 3/5    previous 3/5   Right/Left hand Right;Left    Right Hand Grip (lbs) 40   previous 35   Right Hand Lateral Pinch 13 lbs   previous 10   Right Hand 3 Point Pinch 11 lbs   previous 10   Left Hand Grip (lbs) 10   previous 0   Left Hand Lateral Pinch 4 lbs   previous 0   Left Hand 3 Point Pinch 3 lbs   previous 1                             OT Short Term Goals - 12/21/19 1504      OT SHORT TERM GOAL #1   Title Patient will be educated and independent with HEP in order to increase functional use of both arms while completing basic self care tasks.    Time 3    Period Weeks    Status Achieved    Target Date 12/19/19      OT SHORT TERM GOAL #2   Title Patient will increase her BUE P/ROM to [facility] in order to increase ability to complete upper body dressing tasks with less difficulty.    Time 3    Period Weeks    Status On-going      OT SHORT TERM GOAL #3   Title Patient will increase bilateral hand grip strength by 5# and pinch strength by 2# in order to increase ability to open jars and containers that are not tight.    Time 3    Period Weeks    Status Achieved      OT SHORT TERM GOAL #4   Title Patient will increase her left hand coordination by completing the 9 hole peg test in under 1 minute in order to participate in simple housekeeping tasks with less difficulty.    Time 3    Period Weeks    Status Achieved      OT SHORT TERM GOAL #5   Title --    Time --    Period --    Status --             OT Long Term Goals - 12/21/19 1630      OT LONG TERM GOAL #1   Title Patient will return to using both arms  for all daily tasks while using her LUE for 75% of more of daily tasks.    Time 8    Period Weeks    Status On-going      OT LONG TERM GOAL #2   Title Patient will report a decrease in pain in BUE during functional tasks of approximately 3/10 or less.    Time 8    Period Weeks    Status On-going      OT LONG TERM GOAL #3   Title Patient will  increase BUE shoulder, wrist, and hand A/ROM to WNL or as close as possible in order to decrease difficulty level during dressing and functional reaching tasks.    Time 8    Period Weeks    Status On-going      OT LONG TERM GOAL #4   Title Patient will increase grip strength in the right hand to 45#, left hand to 10#, and left pinch strength to 5# in order to increase ability to manipulate and open jars and containers without needing assistance.    Time 8    Period Weeks    Status On-going      OT LONG TERM GOAL #5   Title Patient will decrease the bilateral UE fascial restrictions to min amount or less in order to increase her functional mobility needed to complete desire housekeeping tasks.    Time 8    Period Weeks    Status On-going      Long Term Additional Goals   Additional Long Term Goals Yes      OT LONG TERM GOAL #6   Title Patient will increase her left hand coordination by completing the 9 hole peg test in 45" or better to allow her to manipulate small items at home such as buttons, zippers, etc.    Time 8    Period Weeks    Status On-going      OT LONG TERM GOAL #7   Title Patient will improve BUE strength to at least a 4-/5 in order to promote patient's ability to complete daily self-care tasks with less difficulty.    Time 8    Period Weeks    Status New                 Plan - 12/21/19 1551    Clinical Impression Statement A: Discussed with patient about if she attended recent follow-up with Dr. Merlyn LotKuzma on October 8th in order for therapist to have updated restrictions and precautions for left hand. Patient reported she did not attend due to transportation issues, with no rescheduled appointment in place. At this time, information to contact Dr. Merlyn LotKuzma has been provided 3 times and therapy on patient's hand/thumb is not warranted until an updated restriction/precaution order has been put in place. Reassessment completed with patient meeting 3 STGs and partially  meeting 1 STG, added goal for BUE strength. Patient demonstrates improvements this session with A/ROM, grip and pinch strength as well as LUE coordination. Patient still demonstrates decreased strength of BUEs, A/ROM and functional use, however patient did report improvement in pain today especially during A/ROM.    Body Structure / Function / Physical Skills ADL;Fascial restriction;UE functional use;Pain;FMC;ROM;GMC;Coordination;Decreased knowledge of precautions    Plan P: follow up on if she spoke with or scheduled appt with Dr. Merlyn LotKuzma, assess P/ROM during passive stretches, complete AA/ROM sitting and update HEP if good form, grasping activity    Consulted and Agree with Plan of Care Patient  Patient will benefit from skilled therapeutic intervention in order to improve the following deficits and impairments:   Body Structure / Function / Physical Skills: ADL, Fascial restriction, UE functional use, Pain, FMC, ROM, GMC, Coordination, Decreased knowledge of precautions       Visit Diagnosis: Stiffness of left shoulder, not elsewhere classified  Stiffness of right shoulder, not elsewhere classified  Acute pain of left shoulder  Other lack of coordination  Other symptoms and signs involving the musculoskeletal system    Problem List Patient Active Problem List   Diagnosis Date Noted  . Acute blood loss anemia 09/04/2019  . Drainage from wound 09/04/2019  . 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 Neuropathy 09/04/2019  . Neurogenic bowel 09/04/2019  . Central cord syndrome (HCC) 08/07/2019  . Thoracic spine fracture [facility]) 08/03/2019     [place], [place] Student [phone]   12/22/2019, 8:33 AM  [facility] [address] [place], [place], [address] Phone: [phone]   Fax:  [phone]  Name: Shelly Houston MRN: [id number] Date of Birth: Jan 19, 1971

## 2019-12-25 ENCOUNTER — Ambulatory Visit (HOSPITAL_COMMUNITY): Payer: Medicaid Other

## 2019-12-25 ENCOUNTER — Other Ambulatory Visit: Payer: Self-pay

## 2019-12-25 ENCOUNTER — Encounter (HOSPITAL_COMMUNITY): Payer: Self-pay

## 2019-12-25 ENCOUNTER — Encounter (HOSPITAL_COMMUNITY): Payer: Medicaid Other | Admitting: Physical Therapy

## 2019-12-25 DIAGNOSIS — M25611 Stiffness of right shoulder, not elsewhere classified: Secondary | ICD-10-CM

## 2019-12-25 DIAGNOSIS — M25512 Pain in left shoulder: Secondary | ICD-10-CM | POA: Diagnosis not present

## 2019-12-25 DIAGNOSIS — R29898 Other symptoms and signs involving the musculoskeletal system: Secondary | ICD-10-CM

## 2019-12-25 DIAGNOSIS — R278 Other lack of coordination: Secondary | ICD-10-CM

## 2019-12-25 DIAGNOSIS — M25612 Stiffness of left shoulder, not elsewhere classified: Secondary | ICD-10-CM | POA: Diagnosis not present

## 2019-12-25 NOTE — Therapy (Addendum)
[facility] [address] [place], [place], [address] Phone: [phone]   Fax:  [phone]  Occupational Therapy Treatment  Patient Details  Name: Shelly Houston MRN: [id number] Date of Birth: 03/13/1970 Referring Provider (OT): Delle Reining, [place]   Encounter Date: 12/25/2019   OT End of Session - 12/25/19 1200    Visit Number 6    Number of Visits 12    Date for OT Re-Evaluation 01/23/20    Authorization Type [employer]    Authorization Time Period 12 visits approved from 12/01/19- 01/30/20    Authorization - Visit Number 4    Authorization - Number of Visits 12    OT Start Time 1134   pt arrived late   OT Stop Time 1157    OT Time Calculation (min) 23 min    Activity Tolerance Patient tolerated treatment well    Behavior During Therapy [facility] for tasks assessed/performed           History reviewed. No pertinent past medical history.  History reviewed. No pertinent surgical history.  There were no vitals filed for this visit.   Subjective Assessment - 12/25/19 1137    Subjective  S: My daughter called the Dr    Patient is accompanied by: Interpreter   Sonia from interpreter services   Currently in Pain? No/denies              [facility] OT Assessment - 12/25/19 1142      Assessment   Medical Diagnosis Central cord syndrome      Precautions   Precautions Cervical;Back;Other (comment)    Precaution Comments Minerva brace with C collar on at all times except to shower.     Required Braces or Orthoses Cervical Brace;Spinal Brace;Other Brace/Splint    Cervical Brace Hard collar;At all times    Spinal Brace Other (comment)                    OT Treatments/Exercises (OP) - 12/25/19 1138      Exercises   Exercises Shoulder;Elbow;Wrist      Shoulder Exercises: Supine   Protraction AAROM;10 reps    Horizontal ABduction AAROM;10 reps    External Rotation AAROM;10 reps    Internal Rotation AAROM;10 reps     Flexion AAROM;10 reps    ABduction AAROM;10 reps      Shoulder Exercises: Standing   Protraction AAROM;10 reps    Horizontal ABduction AAROM;10 reps    External Rotation AAROM;10 reps    Internal Rotation AAROM;10 reps    Flexion AAROM;10 reps    ABduction AAROM;10 reps      Shoulder Exercises: ROM/Strengthening   Other ROM/Strengthening Exercises Wall wash, 1' flexion                    OT Short Term Goals - 12/25/19 1206      OT SHORT TERM GOAL #1   Title Patient will be educated and independent with HEP in order to increase functional use of both arms while completing basic self care tasks.    Time 3    Period Weeks    Target Date 12/19/19      OT SHORT TERM GOAL #2   Title Patient will increase her BUE P/ROM to [facility] in order to increase ability to complete upper body dressing tasks with less difficulty.    Time 3    Period Weeks    Status On-going      OT SHORT  TERM GOAL #3   Title Patient will increase bilateral hand grip strength by 5# and pinch strength by 2# in order to increase ability to open jars and containers that are not tight.    Time 3    Period Weeks      OT SHORT TERM GOAL #4   Title Patient will increase her left hand coordination by completing the 9 hole peg test in under 1 minute in order to participate in simple housekeeping tasks with less difficulty.    Time 3    Period Weeks             OT Long Term Goals - 12/25/19 1207      OT LONG TERM GOAL #1   Title Patient will return to using both arms for all daily tasks while using her LUE for 75% of more of daily tasks.    Time 8    Period Weeks    Status On-going      OT LONG TERM GOAL #2   Title Patient will report a decrease in pain in BUE during functional tasks of approximately 3/10 or less.    Time 8    Period Weeks    Status On-going      OT LONG TERM GOAL #3   Title Patient will increase BUE shoulder, wrist, and hand A/ROM to WNL or as close as possible in order to  decrease difficulty level during dressing and functional reaching tasks.    Time 8    Period Weeks    Status On-going      OT LONG TERM GOAL #4   Title Patient will increase grip strength in the right hand to 45#, left hand to 10#, and left pinch strength to 5# in order to increase ability to manipulate and open jars and containers without needing assistance.    Time 8    Period Weeks    Status On-going      OT LONG TERM GOAL #5   Title Patient will decrease the bilateral UE fascial restrictions to min amount or less in order to increase her functional mobility needed to complete desire housekeeping tasks.    Time 8    Period Weeks    Status On-going      OT LONG TERM GOAL #6   Title Patient will increase her left hand coordination by completing the 9 hole peg test in 45" or better to allow her to manipulate small items at home such as buttons, zippers, etc.    Time 8    Period Weeks    Status On-going      OT LONG TERM GOAL #7   Title Patient will improve BUE strength to at least a 4-/5 in order to promote patient's ability to complete daily self-care tasks with less difficulty.    Time 8    Period Weeks    Status On-going                 Plan - 12/25/19 1202    Clinical Impression Statement A: Patient reported that her daughter was able to call Dr. Merlyn Lot however was unable to schedule an appointment and is awaiting the call to be returned to inquire a follow-up visit as discussed being important for updated precautions and restrictions. Completed AA/ROM supine and progressed to seated, with patient also completing wall wash in shoulder flexion with VC and tactile cues required for form and technique. Patient continues to have limited elbow extension when reaching with  LUE and was educated on completing wall wash at home with focus on straightening elbow throughout.    Body Structure / Function / Physical Skills ADL;Fascial restriction;UE functional  use;Pain;FMC;ROM;GMC;Coordination;Decreased knowledge of precautions    Plan P: Provide AA/ROM HEP (in spanish), follow up on if patient has heard from MD, grasping activity    Consulted and Agree with Plan of Care Patient           Patient will benefit from skilled therapeutic intervention in order to improve the following deficits and impairments:   Body Structure / Function / Physical Skills: ADL, Fascial restriction, UE functional use, Pain, FMC, ROM, GMC, Coordination, Decreased knowledge of precautions       Visit Diagnosis: Stiffness of left shoulder, not elsewhere classified  Stiffness of right shoulder, not elsewhere classified  Acute pain of left shoulder  Other lack of coordination  Other symptoms and signs involving the musculoskeletal system    Problem List Patient Active Problem List   Diagnosis Date Noted   Acute blood loss anemia 09/04/2019   Drainage from wound 09/04/2019   Rotator cuff impingement syndrome 09/04/2019   Neuropathy 09/04/2019   Neurogenic bowel 09/04/2019   Central cord syndrome [facility]) 08/07/2019   Thoracic spine fracture [facility]) 08/03/2019     [place], [place] Student [phone]   12/25/2019, 1:47 PM  [facility] [address] [place], [place], [address] Phone: [phone]   Fax:  [phone]  Name: CRESCENT GOTHAM MRN: [id number] Date of Birth: 02/02/1971

## 2019-12-28 ENCOUNTER — Encounter (HOSPITAL_COMMUNITY): Payer: Medicaid Other | Admitting: Physical Therapy

## 2019-12-28 ENCOUNTER — Ambulatory Visit (HOSPITAL_COMMUNITY): Payer: Medicaid Other

## 2020-01-01 ENCOUNTER — Encounter (HOSPITAL_COMMUNITY): Payer: Medicaid Other | Admitting: Physical Therapy

## 2020-01-01 ENCOUNTER — Ambulatory Visit (HOSPITAL_COMMUNITY): Payer: Medicaid Other | Attending: Physical Medicine and Rehabilitation

## 2020-01-01 DIAGNOSIS — S14129D Central cord syndrome at unspecified level of cervical spinal cord, subsequent encounter: Secondary | ICD-10-CM | POA: Insufficient documentation

## 2020-01-01 DIAGNOSIS — M25612 Stiffness of left shoulder, not elsewhere classified: Secondary | ICD-10-CM | POA: Insufficient documentation

## 2020-01-01 DIAGNOSIS — M25512 Pain in left shoulder: Secondary | ICD-10-CM | POA: Insufficient documentation

## 2020-01-01 DIAGNOSIS — R278 Other lack of coordination: Secondary | ICD-10-CM | POA: Insufficient documentation

## 2020-01-01 DIAGNOSIS — R29898 Other symptoms and signs involving the musculoskeletal system: Secondary | ICD-10-CM | POA: Insufficient documentation

## 2020-01-01 DIAGNOSIS — Z419 Encounter for procedure for purposes other than remedying health state, unspecified: Secondary | ICD-10-CM | POA: Diagnosis not present

## 2020-01-01 DIAGNOSIS — M25611 Stiffness of right shoulder, not elsewhere classified: Secondary | ICD-10-CM | POA: Insufficient documentation

## 2020-01-04 ENCOUNTER — Telehealth (HOSPITAL_COMMUNITY): Payer: Self-pay

## 2020-01-04 ENCOUNTER — Encounter (HOSPITAL_COMMUNITY): Payer: Medicaid Other | Admitting: Physical Therapy

## 2020-01-04 ENCOUNTER — Ambulatory Visit (HOSPITAL_COMMUNITY): Payer: Medicaid Other

## 2020-01-04 NOTE — Telephone Encounter (Signed)
pt has another appt at the same time

## 2020-01-08 ENCOUNTER — Encounter (HOSPITAL_COMMUNITY): Payer: Medicaid Other | Admitting: Physical Therapy

## 2020-01-08 ENCOUNTER — Other Ambulatory Visit: Payer: Self-pay

## 2020-01-08 ENCOUNTER — Ambulatory Visit (HOSPITAL_COMMUNITY): Payer: Medicaid Other

## 2020-01-08 ENCOUNTER — Ambulatory Visit: Payer: Medicaid Other | Attending: Nurse Practitioner | Admitting: Nurse Practitioner

## 2020-01-08 ENCOUNTER — Encounter (HOSPITAL_COMMUNITY): Payer: Self-pay

## 2020-01-08 ENCOUNTER — Encounter: Payer: Self-pay | Admitting: Nurse Practitioner

## 2020-01-08 VITALS — Ht 61.0 in

## 2020-01-08 DIAGNOSIS — M25512 Pain in left shoulder: Secondary | ICD-10-CM | POA: Diagnosis not present

## 2020-01-08 DIAGNOSIS — R29898 Other symptoms and signs involving the musculoskeletal system: Secondary | ICD-10-CM

## 2020-01-08 DIAGNOSIS — D649 Anemia, unspecified: Secondary | ICD-10-CM | POA: Diagnosis not present

## 2020-01-08 DIAGNOSIS — S14129D Central cord syndrome at unspecified level of cervical spinal cord, subsequent encounter: Secondary | ICD-10-CM | POA: Diagnosis not present

## 2020-01-08 DIAGNOSIS — Z7689 Persons encountering health services in other specified circumstances: Secondary | ICD-10-CM | POA: Diagnosis not present

## 2020-01-08 DIAGNOSIS — R278 Other lack of coordination: Secondary | ICD-10-CM | POA: Diagnosis not present

## 2020-01-08 DIAGNOSIS — M25611 Stiffness of right shoulder, not elsewhere classified: Secondary | ICD-10-CM

## 2020-01-08 DIAGNOSIS — M25612 Stiffness of left shoulder, not elsewhere classified: Secondary | ICD-10-CM

## 2020-01-08 MED ORDER — POLYSACCHARIDE IRON COMPLEX 150 MG PO CAPS: 150.0000 mg | ORAL_CAPSULE | Freq: Every day | ORAL | 1 refills | Status: DC
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
## 2020-01-08 NOTE — Therapy (Signed)
[facility] [address] [place], [place], [address] Phone: [phone]   Fax:  [phone]  Occupational Therapy Treatment  Patient Details  Name: Shelly Houston MRN: [id number] Date of Birth: 1970-03-06 Referring Provider (OT): Delle Reining, [place]   Encounter Date: 01/08/2020   OT End of Session - 01/08/20 1105    Visit Number 7    Number of Visits 12    Date for OT Re-Evaluation 01/23/20    Authorization Type [employer]    Authorization Time Period 12 visits approved from 12/01/19- 01/30/20    Authorization - Visit Number 5    Authorization - Number of Visits 12    OT Start Time 1032    OT Stop Time 1113    OT Time Calculation (min) 41 min    Activity Tolerance Patient tolerated treatment well    Behavior During Therapy [facility] for tasks assessed/performed           History reviewed. No pertinent past medical history.  History reviewed. No pertinent surgical history.  There were no vitals filed for this visit.   Subjective Assessment - 01/08/20 1035    Subjective  S: I had insurance issues so I couldn't come to therapy    Patient is accompanied by: Interpreter   Sonia from [employer] services   Currently in Pain? No/denies              [facility] OT Assessment - 01/08/20 1259      Assessment   Medical Diagnosis Central cord syndrome      Precautions   Precautions Cervical;Back;Other (comment)    Precaution Comments Minerva brace with C collar on at all times except to shower.     Required Braces or Orthoses Cervical Brace;Spinal Brace;Other Brace/Splint    Cervical Brace Hard collar;At all times    Spinal Brace Other (comment)                    OT Treatments/Exercises (OP) - 01/08/20 1036      Exercises   Exercises Shoulder;Elbow;Wrist      Shoulder Exercises: Supine   Protraction PROM;5 reps;AAROM;10 reps    Horizontal ABduction PROM;5 reps;AAROM;10 reps    External Rotation PROM;5 reps;AAROM;10 reps     Internal Rotation PROM;5 reps;AAROM;10 reps    Flexion PROM;5 reps;AAROM;10 reps    ABduction PROM;5 reps;AAROM;10 reps      Shoulder Exercises: Standing   Protraction AAROM;10 reps    Horizontal ABduction AAROM;10 reps    External Rotation AAROM;10 reps    Internal Rotation AAROM;10 reps    Flexion AAROM;10 reps    ABduction AAROM;10 reps      Manual Therapy   Manual Therapy Soft tissue mobilization    Manual therapy comments manual therapy completed seperately from all other interventions this date    Soft tissue mobilization soft tissue mobilization to LUE, bilateral upper trap and scapular regions to decrease pain and fascial restrictions that are impeding a/rom                   OT Education - 01/08/20 1107    Education Details AA/ROM supine and standing    Person(s) Educated Patient    Methods Explanation;Handout;Tactile cues;Verbal cues;Demonstration    Comprehension Verbalized understanding;Returned demonstration;Verbal cues required;Need further instruction;Tactile cues required            OT Short Term Goals - 12/25/19 1206      OT SHORT TERM GOAL #1  Title Patient will be educated and independent with HEP in order to increase functional use of both arms while completing basic self care tasks.    Time 3    Period Weeks    Target Date 12/19/19      OT SHORT TERM GOAL #2   Title Patient will increase her BUE P/ROM to [facility] in order to increase ability to complete upper body dressing tasks with less difficulty.    Time 3    Period Weeks    Status On-going      OT SHORT TERM GOAL #3   Title Patient will increase bilateral hand grip strength by 5# and pinch strength by 2# in order to increase ability to open jars and containers that are not tight.    Time 3    Period Weeks      OT SHORT TERM GOAL #4   Title Patient will increase her left hand coordination by completing the 9 hole peg test in under 1 minute in order to participate in simple housekeeping  tasks with less difficulty.    Time 3    Period Weeks             OT Long Term Goals - 12/25/19 1207      OT LONG TERM GOAL #1   Title Patient will return to using both arms for all daily tasks while using her LUE for 75% of more of daily tasks.    Time 8    Period Weeks    Status On-going      OT LONG TERM GOAL #2   Title Patient will report a decrease in pain in BUE during functional tasks of approximately 3/10 or less.    Time 8    Period Weeks    Status On-going      OT LONG TERM GOAL #3   Title Patient will increase BUE shoulder, wrist, and hand A/ROM to WNL or as close as possible in order to decrease difficulty level during dressing and functional reaching tasks.    Time 8    Period Weeks    Status On-going      OT LONG TERM GOAL #4   Title Patient will increase grip strength in the right hand to 45#, left hand to 10#, and left pinch strength to 5# in order to increase ability to manipulate and open jars and containers without needing assistance.    Time 8    Period Weeks    Status On-going      OT LONG TERM GOAL #5   Title Patient will decrease the bilateral UE fascial restrictions to min amount or less in order to increase her functional mobility needed to complete desire housekeeping tasks.    Time 8    Period Weeks    Status On-going      OT LONG TERM GOAL #6   Title Patient will increase her left hand coordination by completing the 9 hole peg test in 45" or better to allow her to manipulate small items at home such as buttons, zippers, etc.    Time 8    Period Weeks    Status On-going      OT LONG TERM GOAL #7   Title Patient will improve BUE strength to at least a 4-/5 in order to promote patient's ability to complete daily self-care tasks with less difficulty.    Time 8    Period Weeks    Status On-going  Plan - 01/08/20 1108    Clinical Impression Statement A: Patient reports that she was unable to come to therapy for a  couple of visits and has been unable to schedule a visit with Dr. Merlyn Lot due to insurance issues. Reports she won't be able to schedule with Dr. Merlyn Lot until insurance is worked out, therefore therapist is unable to work on Lobbyist. Continued manual techniques and passive stretching this date with improvements in P/ROM and pain with passive stretching noted. Continued AA/ROM supine and standing, with hands on cueing required consistently. Patient demonstrated improvement in keeping left elbow straight during AA/ROM supine, however is consistently lacking elbow extension for promotion of ROM at left shoulder during exercises.    Body Structure / Function / Physical Skills ADL;Fascial restriction;UE functional use;Pain;FMC;ROM;GMC;Coordination;Decreased knowledge of precautions    Plan P: Follow up on HEP, continue AA/ROM, grasping and functional reaching activities    OT Home Exercise Plan eval: table slides (flexion, abduction), 10/4: AA/ROM wrist and elbow (flexion, extension) 11/8: AA/ROM supine and seated    Consulted and Agree with Plan of Care Patient           Patient will benefit from skilled therapeutic intervention in order to improve the following deficits and impairments:   Body Structure / Function / Physical Skills: ADL, Fascial restriction, UE functional use, Pain, FMC, ROM, GMC, Coordination, Decreased knowledge of precautions       Visit Diagnosis: Stiffness of left shoulder, not elsewhere classified  Stiffness of right shoulder, not elsewhere classified  Acute pain of left shoulder  Other lack of coordination  Other symptoms and signs involving the musculoskeletal system    Problem List Patient Active Problem List   Diagnosis Date Noted  . Acute blood loss anemia 09/04/2019  . Drainage from wound 09/04/2019  . 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 Neuropathy 09/04/2019  . Neurogenic bowel 09/04/2019  . Central cord syndrome (HCC) 08/07/2019    . Thoracic spine fracture [facility]) 08/03/2019     [place], [place] Student [phone]   01/08/2020, 1:01 PM  [facility] [address] [place], [place], [address] Phone: [phone]   Fax:  [phone]  Name: Shelly Houston MRN: [id number] Date of Birth: 1970-05-17

## 2020-01-08 NOTE — Patient Instructions (Signed)
1) Protraccin de hombre (protraction) Empezar por caa o varita a la altura del pecho. A continuacin, Presione lentamente la varilla hacia fuera delante de su cuerpo para que los codos se convierten totalmente enderezados. A continuacin, volver a la posicin original.          2) Flexin de hombre (Flexion) En la posicin de pie, sostenga una varita de caa con ambos brazos. Levantar la varita de caa permitiendo que el brazo afectado para llevar a cabo la mayor parte del esfuerzo. Su brazo afectado debe ser parcialmente relajado                           3) Horizontal abduccin y aduccin (horizontal ab/adduction) Brazos rectos con caa a la altura de los hombros, llevar caa derecha, centro, izquierda. Repita a partir a Network engineer.        4) Rotacin interna y externa En la posicin de pie, sostenga una varita de caa con las [employer] codos doblados. Mueva los brazos y la caa de tubo a un lado. Su brazo afectado debe ser parcialmente relajado mientras que el brazo afectado realiza la mayora de los esfuerzos.                                                                                                                                                               5) Abduccin del hombre (abduction) Mientras est sosteniendo una varita/caa Pal[place] cara en el lado lesionado y Palma cara abajo en el lado ileso, lentamente levante su brazo lesionado al lado.         Realizar cada ejercicio 10 veces. 2-3 al da.

## 2020-01-10 ENCOUNTER — Other Ambulatory Visit: Payer: Self-pay | Admitting: Nurse Practitioner

## 2020-01-10 ENCOUNTER — Other Ambulatory Visit: Payer: Medicaid Other

## 2020-01-10 DIAGNOSIS — Z1211 Encounter for screening for malignant neoplasm of colon: Secondary | ICD-10-CM

## 2020-01-10 DIAGNOSIS — R7309 Other abnormal glucose: Secondary | ICD-10-CM

## 2020-01-10 DIAGNOSIS — D62 Acute posthemorrhagic anemia: Secondary | ICD-10-CM

## 2020-01-10 DIAGNOSIS — Z13228 Encounter for screening for other metabolic disorders: Secondary | ICD-10-CM

## 2020-01-10 DIAGNOSIS — Z13220 Encounter for screening for lipoid disorders: Secondary | ICD-10-CM

## 2020-01-11 ENCOUNTER — Ambulatory Visit (HOSPITAL_COMMUNITY): Payer: Medicaid Other

## 2020-01-11 ENCOUNTER — Other Ambulatory Visit: Payer: Self-pay

## 2020-01-11 ENCOUNTER — Encounter (HOSPITAL_COMMUNITY): Payer: Self-pay

## 2020-01-11 ENCOUNTER — Encounter (HOSPITAL_COMMUNITY): Payer: Medicaid Other | Admitting: Physical Therapy

## 2020-01-11 DIAGNOSIS — M25512 Pain in left shoulder: Secondary | ICD-10-CM | POA: Diagnosis not present

## 2020-01-11 DIAGNOSIS — R278 Other lack of coordination: Secondary | ICD-10-CM

## 2020-01-11 DIAGNOSIS — M25612 Stiffness of left shoulder, not elsewhere classified: Secondary | ICD-10-CM | POA: Diagnosis not present

## 2020-01-11 DIAGNOSIS — S14129D Central cord syndrome at unspecified level of cervical spinal cord, subsequent encounter: Secondary | ICD-10-CM | POA: Diagnosis not present

## 2020-01-11 DIAGNOSIS — M25611 Stiffness of right shoulder, not elsewhere classified: Secondary | ICD-10-CM | POA: Diagnosis not present

## 2020-01-11 DIAGNOSIS — R29898 Other symptoms and signs involving the musculoskeletal system: Secondary | ICD-10-CM

## 2020-01-11 NOTE — Therapy (Signed)
[facility] [address] [place], [place], [address] Phone: [phone]   Fax:  [phone]  Occupational Therapy Treatment  Patient Details  Name: Shelly Houston MRN: [id number] Date of Birth: 11-May-1970 Referring Provider (OT): Delle Reining, [place]   Encounter Date: 01/11/2020   OT End of Session - 01/11/20 1320    Visit Number 8    Number of Visits 12    Date for OT Re-Evaluation 01/23/20    Authorization Type [employer]    Authorization Time Period 12 visits approved from 12/01/19- 01/30/20    Authorization - Visit Number 6    Authorization - Number of Visits 12    OT Start Time 1300    OT Stop Time 1339    OT Time Calculation (min) 39 min    Activity Tolerance Patient tolerated treatment well    Behavior During Therapy [facility] for tasks assessed/performed           Past Medical History:  Diagnosis Date  . Central cord syndrome [facility])     Past Surgical History:  Procedure Laterality Date  . ABDOMINAL HYSTERECTOMY    . head surgery  2021    There were no vitals filed for this visit.   Subjective Assessment - 01/11/20 1303    Subjective  S: I'm doing more better    Currently in Pain? No/denies              [facility] OT Assessment - 01/11/20 1352      Assessment   Medical Diagnosis Central cord syndrome      Precautions   Precautions Cervical;Back;Other (comment)    Precaution Comments Minerva brace with C collar on at all times except to shower.     Required Braces or Orthoses Cervical Brace;Spinal Brace;Other Brace/Splint    Cervical Brace Hard collar;At all times    Spinal Brace Other (comment)                    OT Treatments/Exercises (OP) - 01/11/20 1303      Exercises   Exercises Shoulder;Elbow;Wrist      Shoulder Exercises: Supine   Protraction PROM;5 reps    Horizontal ABduction PROM;5 reps    External Rotation PROM;5 reps    Internal Rotation PROM;5 reps    Flexion PROM;5 reps     ABduction PROM;5 reps      Shoulder Exercises: Standing   Protraction AAROM;10 reps    Horizontal ABduction AAROM;10 reps    External Rotation AAROM;10 reps    Internal Rotation AAROM;10 reps    Flexion AAROM;10 reps    ABduction AAROM;10 reps      Shoulder Exercises: Pulleys   Flexion Other (comment)   10 reps with OT assist at elbow for extension   ABduction Other (comment)   10 reps OT assist at elbow for extension     Shoulder Exercises: ROM/Strengthening   Other ROM/Strengthening Exercises Wall wash, 1' flexion    Other ROM/Strengthening Exercises Placing then taking down 6 cones on bottom shelf of sink cabinent then 6 cones on middle shelf. Focus on no trunk rotation and extension of elbow      Manual Therapy   Manual Therapy Soft tissue mobilization    Manual therapy comments manual therapy completed seperately from all other interventions this date    Soft tissue mobilization soft tissue mobilization to LUE, bilateral upper trap and scapular regions to decrease pain and fascial restrictions that are impeding a/rom  OT Short Term Goals - 12/25/19 1206      OT SHORT TERM GOAL #1   Title Patient will be educated and independent with HEP in order to increase functional use of both arms while completing basic self care tasks.    Time 3    Period Weeks    Target Date 12/19/19      OT SHORT TERM GOAL #2   Title Patient will increase her BUE P/ROM to [facility] in order to increase ability to complete upper body dressing tasks with less difficulty.    Time 3    Period Weeks    Status On-going      OT SHORT TERM GOAL #3   Title Patient will increase bilateral hand grip strength by 5# and pinch strength by 2# in order to increase ability to open jars and containers that are not tight.    Time 3    Period Weeks      OT SHORT TERM GOAL #4   Title Patient will increase her left hand coordination by completing the 9 hole peg test in under 1 minute in order  to participate in simple housekeeping tasks with less difficulty.    Time 3    Period Weeks             OT Long Term Goals - 12/25/19 1207      OT LONG TERM GOAL #1   Title Patient will return to using both arms for all daily tasks while using her LUE for 75% of more of daily tasks.    Time 8    Period Weeks    Status On-going      OT LONG TERM GOAL #2   Title Patient will report a decrease in pain in BUE during functional tasks of approximately 3/10 or less.    Time 8    Period Weeks    Status On-going      OT LONG TERM GOAL #3   Title Patient will increase BUE shoulder, wrist, and hand A/ROM to WNL or as close as possible in order to decrease difficulty level during dressing and functional reaching tasks.    Time 8    Period Weeks    Status On-going      OT LONG TERM GOAL #4   Title Patient will increase grip strength in the right hand to 45#, left hand to 10#, and left pinch strength to 5# in order to increase ability to manipulate and open jars and containers without needing assistance.    Time 8    Period Weeks    Status On-going      OT LONG TERM GOAL #5   Title Patient will decrease the bilateral UE fascial restrictions to min amount or less in order to increase her functional mobility needed to complete desire housekeeping tasks.    Time 8    Period Weeks    Status On-going      OT LONG TERM GOAL #6   Title Patient will increase her left hand coordination by completing the 9 hole peg test in 45" or better to allow her to manipulate small items at home such as buttons, zippers, etc.    Time 8    Period Weeks    Status On-going      OT LONG TERM GOAL #7   Title Patient will improve BUE strength to at least a 4-/5 in order to promote patient's ability to complete daily self-care tasks with less difficulty.  Time 8    Period Weeks    Status On-going                 Plan - 01/11/20 1346    Clinical Impression Statement A: Patient reports that she  attended her MD appointment yesterday with Dr. Merlyn Lot but they turned her away due to her insurance card stating an error of her living at a nursing home. Patient advised to follow up with insurance company to ensure new card is on the way, and to schedule with MD in advance to secure an appointment. Continued with AA/ROM standing, then functional reaching. Patient with noteable improvement in form and technique without tactile cues provided during AA/ROM. Patient with improved elbow extension during reaching and placing cones, however cues required to avoid compensatory trunk rotation. Attempted pulleys, however patient demonstrating weakenss at elbow and inability to keep straight while lifting arm. OT assist provided at elbow with improvement in ability to use pulley.    Body Structure / Function / Physical Skills ADL;Fascial restriction;UE functional use;Pain;FMC;ROM;GMC;Coordination;Decreased knowledge of precautions    Plan P: Continue functional reaching, introduce elbow extension activity or exercise to promote elbow straightening during functional reach    Consulted and Agree with Plan of Care Patient           Patient will benefit from skilled therapeutic intervention in order to improve the following deficits and impairments:   Body Structure / Function / Physical Skills: ADL, Fascial restriction, UE functional use, Pain, FMC, ROM, GMC, Coordination, Decreased knowledge of precautions       Visit Diagnosis: Stiffness of left shoulder, not elsewhere classified  Stiffness of right shoulder, not elsewhere classified  Acute pain of left shoulder  Other lack of coordination  Other symptoms and signs involving the musculoskeletal system  Central cord syndrome, subsequent encounter [facility])    Problem List Patient Active Problem List   Diagnosis Date Noted  . Acute blood loss anemia 09/04/2019  . Drainage from wound 09/04/2019  . 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  Neuropathy 09/04/2019  . Neurogenic bowel 09/04/2019  . Central cord syndrome (HCC) 08/07/2019  . Thoracic spine fracture [facility]) 08/03/2019     [place], [place] Student [phone]   01/11/2020, 1:52 PM  [facility] [address] [place], [place], [address] Phone: [phone]   Fax:  [phone]  Name: Shelly Houston MRN: [id number] Date of Birth: [birth year]

## 2020-01-15 ENCOUNTER — Encounter (HOSPITAL_COMMUNITY): Payer: Self-pay

## 2020-01-15 ENCOUNTER — Ambulatory Visit (HOSPITAL_COMMUNITY): Payer: Medicaid Other

## 2020-01-15 ENCOUNTER — Other Ambulatory Visit: Payer: Self-pay

## 2020-01-15 ENCOUNTER — Encounter (HOSPITAL_COMMUNITY): Payer: Medicaid Other | Admitting: Physical Therapy

## 2020-01-15 DIAGNOSIS — M25612 Stiffness of left shoulder, not elsewhere classified: Secondary | ICD-10-CM

## 2020-01-15 DIAGNOSIS — R29898 Other symptoms and signs involving the musculoskeletal system: Secondary | ICD-10-CM

## 2020-01-15 DIAGNOSIS — R278 Other lack of coordination: Secondary | ICD-10-CM | POA: Diagnosis not present

## 2020-01-15 DIAGNOSIS — M25512 Pain in left shoulder: Secondary | ICD-10-CM

## 2020-01-15 DIAGNOSIS — M25611 Stiffness of right shoulder, not elsewhere classified: Secondary | ICD-10-CM | POA: Diagnosis not present

## 2020-01-15 DIAGNOSIS — S14129D Central cord syndrome at unspecified level of cervical spinal cord, subsequent encounter: Secondary | ICD-10-CM | POA: Diagnosis not present

## 2020-01-15 NOTE — Therapy (Signed)
[facility] [address] [place], [place], [address] Phone: [phone]   Fax:  [phone]  Occupational Therapy Treatment  Patient Details  Name: Shelly Houston MRN: [id number] Date of Birth: [birth year] Referring Provider (OT): Delle Reining, [place]   Encounter Date: 01/15/2020   OT End of Session - 01/15/20 1355    Visit Number 9    Number of Visits 12    Date for OT Re-Evaluation 01/23/20    Authorization Type [employer]    Authorization Time Period 12 visits approved from 12/01/19- 01/30/20    Authorization - Visit Number 7    Authorization - Number of Visits 12    OT Start Time 1302    OT Stop Time 1340    OT Time Calculation (min) 38 min    Activity Tolerance Patient tolerated treatment well    Behavior During Therapy [facility] for tasks assessed/performed           Past Medical History:  Diagnosis Date  . Central cord syndrome [facility])     Past Surgical History:  Procedure Laterality Date  . ABDOMINAL HYSTERECTOMY    . head surgery  2021    There were no vitals filed for this visit.   Subjective Assessment - 01/15/20 1308    Subjective  S: No pain    Patient is accompanied by: Interpreter    Currently in Pain? No/denies              [facility] OT Assessment - 01/15/20 1306      Assessment   Medical Diagnosis Central cord syndrome      Precautions   Precautions Cervical;Back;Other (comment)    Precaution Comments Minerva brace with C collar on at all times except to shower.     Required Braces or Orthoses Cervical Brace;Spinal Brace;Other Brace/Splint    Cervical Brace Hard collar;At all times                    OT Treatments/Exercises (OP) - 01/15/20 1305      Exercises   Exercises Shoulder;Elbow;Wrist      Shoulder Exercises: Standing   Protraction AAROM;15 reps;AROM;5 reps    Horizontal ABduction AAROM;15 reps;AROM;5 reps    External Rotation AROM;15 reps    Internal Rotation AROM;15 reps     Flexion AAROM;15 reps;AROM;5 reps    ABduction AROM;15 reps      Shoulder Exercises: Pulleys   Flexion 1 minute   focused on elbow extension versus movement     Shoulder Exercises: ROM/Strengthening   Wall Wash 1'    Over Head Lace 2' seated    Other ROM/Strengthening Exercises proximal shoulder strengthening with washcloth; flexion  1'      Functional Reaching Activities   High Level Patient completed functional reaching task using LUE to place and remove cones from middle shelf.                   OT Education - 01/15/20 1355    Education Details verbally instructed patient to use mirror at home and work on elbow extension during shoulder punches. Complete wall wash at home. Updated HEP for standing A/ROM shoulder exercises in spanish.    Person(s) Educated Patient    Methods Explanation;Demonstration;Tactile cues;Verbal cues;Handout    Comprehension Verbalized understanding;Returned demonstration            OT Short Term Goals - 12/25/19 1206      OT SHORT TERM GOAL #1   Title  Patient will be educated and independent with HEP in order to increase functional use of both arms while completing basic self care tasks.    Time 3    Period Weeks    Target Date 12/19/19      OT SHORT TERM GOAL #2   Title Patient will increase her BUE P/ROM to [facility] in order to increase ability to complete upper body dressing tasks with less difficulty.    Time 3    Period Weeks    Status On-going      OT SHORT TERM GOAL #3   Title Patient will increase bilateral hand grip strength by 5# and pinch strength by 2# in order to increase ability to open jars and containers that are not tight.    Time 3    Period Weeks      OT SHORT TERM GOAL #4   Title Patient will increase her left hand coordination by completing the 9 hole peg test in under 1 minute in order to participate in simple housekeeping tasks with less difficulty.    Time 3    Period Weeks             OT Long Term Goals -  12/25/19 1207      OT LONG TERM GOAL #1   Title Patient will return to using both arms for all daily tasks while using her LUE for 75% of more of daily tasks.    Time 8    Period Weeks    Status On-going      OT LONG TERM GOAL #2   Title Patient will report a decrease in pain in BUE during functional tasks of approximately 3/10 or less.    Time 8    Period Weeks    Status On-going      OT LONG TERM GOAL #3   Title Patient will increase BUE shoulder, wrist, and hand A/ROM to WNL or as close as possible in order to decrease difficulty level during dressing and functional reaching tasks.    Time 8    Period Weeks    Status On-going      OT LONG TERM GOAL #4   Title Patient will increase grip strength in the right hand to 45#, left hand to 10#, and left pinch strength to 5# in order to increase ability to manipulate and open jars and containers without needing assistance.    Time 8    Period Weeks    Status On-going      OT LONG TERM GOAL #5   Title Patient will decrease the bilateral UE fascial restrictions to min amount or less in order to increase her functional mobility needed to complete desire housekeeping tasks.    Time 8    Period Weeks    Status On-going      OT LONG TERM GOAL #6   Title Patient will increase her left hand coordination by completing the 9 hole peg test in 45" or better to allow her to manipulate small items at home such as buttons, zippers, etc.    Time 8    Period Weeks    Status On-going      OT LONG TERM GOAL #7   Title Patient will improve BUE strength to at least a 4-/5 in order to promote patient's ability to complete daily self-care tasks with less difficulty.    Time 8    Period Weeks    Status On-going  Plan - 01/15/20 1356    Clinical Impression Statement A: Progressed patient to A/ROM shoulder exercises while continuing to focus on elbow extension. VC were needed throughout session to refrain from trunk rotation and  side bends as compensation to lack of strength.    Body Structure / Function / Physical Skills ADL;Fascial restriction;UE functional use;Pain;FMC;ROM;GMC;Coordination;Decreased knowledge of precautions    Plan P: Follow up on HEP. Continue with A/ROM and focusing on increase elbow extension. Isometric elbow extension and bicep flexion.    OT Home Exercise Plan eval: table slides (flexion, abduction), 10/4: AA/ROM wrist and elbow (flexion, extension) 11/8: AA/ROM supine and seated 11/15: A/ROM shoulder exercises.    Consulted and Agree with Plan of Care Patient           Patient will benefit from skilled therapeutic intervention in order to improve the following deficits and impairments:   Body Structure / Function / Physical Skills: ADL, Fascial restriction, UE functional use, Pain, FMC, ROM, GMC, Coordination, Decreased knowledge of precautions       Visit Diagnosis: Stiffness of left shoulder, not elsewhere classified  Stiffness of right shoulder, not elsewhere classified  Acute pain of left shoulder  Other lack of coordination  Other symptoms and signs involving the musculoskeletal system    Problem List Patient Active Problem List   Diagnosis Date Noted  . Acute blood loss anemia 09/04/2019  . Drainage from wound 09/04/2019  . 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 Neuropathy 09/04/2019  . Neurogenic bowel 09/04/2019  . Central cord syndrome (HCC) 08/07/2019  . Thoracic spine fracture [facility]) 08/03/2019   Limmie Patricia, OTR/L,CBIS  [phone]  01/15/2020, 1:59 PM  [facility] [address] [place], [place], [address] Phone: [phone]   Fax:  [phone]  Name: Shelly Houston MRN: [id number] Date of Birth: Jan 29, 1971

## 2020-01-18 ENCOUNTER — Ambulatory Visit (HOSPITAL_COMMUNITY): Payer: Medicaid Other

## 2020-01-18 ENCOUNTER — Encounter (HOSPITAL_COMMUNITY): Payer: Self-pay

## 2020-01-18 ENCOUNTER — Encounter (HOSPITAL_COMMUNITY): Payer: Medicaid Other | Admitting: Physical Therapy

## 2020-01-18 ENCOUNTER — Other Ambulatory Visit: Payer: Self-pay

## 2020-01-18 DIAGNOSIS — M25612 Stiffness of left shoulder, not elsewhere classified: Secondary | ICD-10-CM | POA: Diagnosis not present

## 2020-01-18 DIAGNOSIS — M25611 Stiffness of right shoulder, not elsewhere classified: Secondary | ICD-10-CM

## 2020-01-18 DIAGNOSIS — R29898 Other symptoms and signs involving the musculoskeletal system: Secondary | ICD-10-CM

## 2020-01-18 DIAGNOSIS — M25512 Pain in left shoulder: Secondary | ICD-10-CM

## 2020-01-18 DIAGNOSIS — R278 Other lack of coordination: Secondary | ICD-10-CM | POA: Diagnosis not present

## 2020-01-18 DIAGNOSIS — S14129D Central cord syndrome at unspecified level of cervical spinal cord, subsequent encounter: Secondary | ICD-10-CM | POA: Diagnosis not present

## 2020-01-18 NOTE — Therapy (Signed)
[facility] [address] [place], [place], [address] Phone: [phone]   Fax:  [phone]  Occupational Therapy Treatment  Patient Details  Name: Shelly Houston MRN: [id number] Date of Birth: 09/29/1970 Referring Provider (OT): Delle Reining, [place]   Encounter Date: 01/18/2020   OT End of Session - 01/18/20 1343    Visit Number 10    Number of Visits 12    Date for OT Re-Evaluation 01/23/20    Authorization Type [employer]    Authorization Time Period 12 visits approved from 12/01/19- 01/30/20    Authorization - Visit Number 8    Authorization - Number of Visits 12    OT Start Time 1300    OT Stop Time 1338    OT Time Calculation (min) 38 min    Activity Tolerance Patient tolerated treatment well    Behavior During Therapy [facility] for tasks assessed/performed           Past Medical History:  Diagnosis Date  . Central cord syndrome [facility])     Past Surgical History:  Procedure Laterality Date  . ABDOMINAL HYSTERECTOMY    . head surgery  2021    There were no vitals filed for this visit.   Subjective Assessment - 01/18/20 1308    Subjective  S: It's better    Patient is accompanied by: Interpreter   Lily   Currently in Pain? No/denies              [facility] OT Assessment - 01/18/20 1309      Assessment   Medical Diagnosis Central cord syndrome      Precautions   Precautions Cervical;Back;Other (comment)    Precaution Comments Minerva brace with C collar on at all times except to shower.     Required Braces or Orthoses Cervical Brace;Spinal Brace;Other Brace/Splint    Cervical Brace Hard collar;At all times                    OT Treatments/Exercises (OP) - 01/18/20 1309      Exercises   Exercises Shoulder;Elbow;Wrist      Shoulder Exercises: Seated   Protraction AROM;10 reps    Horizontal ABduction AROM;10 reps    External Rotation AROM;10 reps    Internal Rotation AROM;10 reps    Flexion AROM;10  reps    Abduction AROM;10 reps    Other Seated Exercises AA/ROM shoulder punches using towel to unweight arm, 12X, with arm extended on table completed shoulder flexion to therapist's hand slightly higher than shoulder level 10X, IR/er AA/ROM using towel focusing on elbow flexion and extension 10X      Shoulder Exercises: ROM/Strengthening   Other ROM/Strengthening Exercises Standing BUE wall slide with towel. Right hand on top of left hand; 10 times with 2" hold at highest level.     Other ROM/Strengthening Exercises PVC pipe slide; 12X      Elbow Exercises   Other elbow exercises Left elbow flexion and extension isometrics seated with elbow at 90 degrees; 5x10"                    OT Short Term Goals - 12/25/19 1206      OT SHORT TERM GOAL #1   Title Patient will be educated and independent with HEP in order to increase functional use of both arms while completing basic self care tasks.    Time 3    Period Weeks    Target Date 12/19/19  OT SHORT TERM GOAL #2   Title Patient will increase her BUE P/ROM to [facility] in order to increase ability to complete upper body dressing tasks with less difficulty.    Time 3    Period Weeks    Status On-going      OT SHORT TERM GOAL #3   Title Patient will increase bilateral hand grip strength by 5# and pinch strength by 2# in order to increase ability to open jars and containers that are not tight.    Time 3    Period Weeks      OT SHORT TERM GOAL #4   Title Patient will increase her left hand coordination by completing the 9 hole peg test in under 1 minute in order to participate in simple housekeeping tasks with less difficulty.    Time 3    Period Weeks             OT Long Term Goals - 12/25/19 1207      OT LONG TERM GOAL #1   Title Patient will return to using both arms for all daily tasks while using her LUE for 75% of more of daily tasks.    Time 8    Period Weeks    Status On-going      OT LONG TERM GOAL #2    Title Patient will report a decrease in pain in BUE during functional tasks of approximately 3/10 or less.    Time 8    Period Weeks    Status On-going      OT LONG TERM GOAL #3   Title Patient will increase BUE shoulder, wrist, and hand A/ROM to WNL or as close as possible in order to decrease difficulty level during dressing and functional reaching tasks.    Time 8    Period Weeks    Status On-going      OT LONG TERM GOAL #4   Title Patient will increase grip strength in the right hand to 45#, left hand to 10#, and left pinch strength to 5# in order to increase ability to manipulate and open jars and containers without needing assistance.    Time 8    Period Weeks    Status On-going      OT LONG TERM GOAL #5   Title Patient will decrease the bilateral UE fascial restrictions to min amount or less in order to increase her functional mobility needed to complete desire housekeeping tasks.    Time 8    Period Weeks    Status On-going      OT LONG TERM GOAL #6   Title Patient will increase her left hand coordination by completing the 9 hole peg test in 45" or better to allow her to manipulate small items at home such as buttons, zippers, etc.    Time 8    Period Weeks    Status On-going      OT LONG TERM GOAL #7   Title Patient will improve BUE strength to at least a 4-/5 in order to promote patient's ability to complete daily self-care tasks with less difficulty.    Time 8    Period Weeks    Status On-going                 Plan - 01/18/20 1345    Clinical Impression Statement A: Focused on tricep and bicep strength on the LUE primarily with isometric strengthening and AA/ROM tasks. Education provided through session regarding carry over at  home and ways to focus on strengtheing LUE. VC and physical cues for form and technique were provided. Muscle fatigue continues to present in the LUE with increase muscle activation. Rest breaks provided as needed. No increased pain  noted.    Body Structure / Function / Physical Skills ADL;Fascial restriction;UE functional use;Pain;FMC;ROM;GMC;Coordination;Decreased knowledge of precautions    Plan P: May need to request more visits from [facility] and do reassessment early. Continue to focus on tricep and bicep strength. Wrist weight on left for bicep curls    Consulted and Agree with Plan of Care Patient           Patient will benefit from skilled therapeutic intervention in order to improve the following deficits and impairments:   Body Structure / Function / Physical Skills: ADL, Fascial restriction, UE functional use, Pain, FMC, ROM, GMC, Coordination, Decreased knowledge of precautions       Visit Diagnosis: Stiffness of left shoulder, not elsewhere classified  Stiffness of right shoulder, not elsewhere classified  Acute pain of left shoulder  Other lack of coordination  Other symptoms and signs involving the musculoskeletal system    Problem List Patient Active Problem List   Diagnosis Date Noted  . Acute blood loss anemia 09/04/2019  . Drainage from wound 09/04/2019  . 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 Neuropathy 09/04/2019  . Neurogenic bowel 09/04/2019  . Central cord syndrome (HCC) 08/07/2019  . Thoracic spine fracture [facility]) 08/03/2019   Limmie Patricia, OTR/L,CBIS  [phone]  01/18/2020, 2:11 PM  [facility] [address] [place], [place], [address] Phone: [phone]   Fax:  [phone]  Name: Shelly Houston MRN: [id number] Date of Birth: Mar 23, 1970

## 2020-01-19 ENCOUNTER — Encounter: Payer: Self-pay | Admitting: Internal Medicine

## 2020-01-19 ENCOUNTER — Ambulatory Visit (INDEPENDENT_AMBULATORY_CARE_PROVIDER_SITE_OTHER): Payer: Medicaid Other | Admitting: Internal Medicine

## 2020-01-19 VITALS — BP 152/91 | HR 71 | Temp 97.1°F | Resp 16 | Ht 61.0 in | Wt 142.8 lb

## 2020-01-19 DIAGNOSIS — Z7689 Persons encountering health services in other specified circumstances: Secondary | ICD-10-CM

## 2020-01-19 DIAGNOSIS — I10 Essential (primary) hypertension: Secondary | ICD-10-CM

## 2020-01-19 DIAGNOSIS — S22008D Other fracture of unspecified thoracic vertebra, subsequent encounter for fracture with routine healing: Secondary | ICD-10-CM | POA: Diagnosis not present

## 2020-01-19 DIAGNOSIS — Z23 Encounter for immunization: Secondary | ICD-10-CM

## 2020-01-19 DIAGNOSIS — S14129S Central cord syndrome at unspecified level of cervical spinal cord, sequela: Secondary | ICD-10-CM

## 2020-01-19 DIAGNOSIS — M7542 Impingement syndrome of left shoulder: Secondary | ICD-10-CM | POA: Diagnosis not present

## 2020-01-19 MED ORDER — LISINOPRIL-HYDROCHLOROTHIAZIDE 20-12.5 MG PO TABS: 1.0000 | ORAL_TABLET | Freq: Every day | ORAL | 3 refills | Status: DC

## 2020-01-19 NOTE — Patient Instructions (Signed)
Please follow up with Orthopedic surgeon and Neurosurgeon as scheduled.  Please continue your physical therapy.  Please start taking Lisinopril-HCTZ as prescribed.  Please follow DASH diet and perform moderate exercise at least 150 mins/week.  DASH stands for Dietary Approaches to Stop Hypertension. The DASH diet is a healthy-eating plan designed to help treat or prevent high blood pressure (hypertension).  The DASH diet includes foods that are rich in potassium, calcium and magnesium. These nutrients help control blood pressure. The diet limits foods that are high in sodium, saturated fat and added sugars.  Studies have shown that the DASH diet can lower blood pressure in as little as two weeks. The diet can also lower low-density lipoprotein (LDL or "bad") cholesterol levels in the blood. High blood pressure and high LDL cholesterol levels are two major risk factors for heart disease and stroke.    DASH diet: Recommended servings The DASH diet provides daily and weekly nutritional goals. The number of servings you should have depends on your daily calorie needs.  Here's a look at the recommended servings from each food group for a 2,000-calorie-a-day DASH diet:  Grains: 6 to 8 servings a day. One serving is one slice bread, 1 ounce dry cereal, or 1/2 cup cooked cereal, rice or pasta. Vegetables: 4 to 5 servings a day. One serving is 1 cup raw leafy green vegetable, 1/2 cup cut-up raw or cooked vegetables, or 1/2 cup vegetable juice. Fruits: 4 to 5 servings a day. One serving is one medium fruit, 1/2 cup fresh, frozen or canned fruit, or 1/2 cup fruit juice. Fat-free or low-fat dairy products: 2 to 3 servings a day. One serving is 1 cup milk or yogurt, or 1 1/2 ounces cheese. Lean meats, poultry and fish: six 1-ounce servings or fewer a day. One serving is 1 ounce cooked meat, poultry or fish, or 1 egg. Nuts, seeds and legumes: 4 to 5 servings a week. One serving is 1/3 cup nuts, 2  tablespoons peanut butter, 2 tablespoons seeds, or 1/2 cup cooked legumes (dried beans or peas). Fats and oils: 2 to 3 servings a day. One serving is 1 teaspoon soft margarine, 1 teaspoon vegetable oil, 1 tablespoon mayonnaise or 2 tablespoons salad dressing. Sweets and added sugars: 5 servings or fewer a week. One serving is 1 tablespoon sugar, jelly or jam, 1/2 cup sorbet, or 1 cup lemonade.

## 2020-01-22 ENCOUNTER — Encounter (HOSPITAL_COMMUNITY): Payer: Medicaid Other | Admitting: Physical Therapy

## 2020-01-22 ENCOUNTER — Other Ambulatory Visit: Payer: Self-pay

## 2020-01-22 ENCOUNTER — Encounter (HOSPITAL_COMMUNITY): Payer: Self-pay

## 2020-01-22 ENCOUNTER — Ambulatory Visit (HOSPITAL_COMMUNITY): Payer: Medicaid Other

## 2020-01-22 DIAGNOSIS — S14129D Central cord syndrome at unspecified level of cervical spinal cord, subsequent encounter: Secondary | ICD-10-CM | POA: Diagnosis not present

## 2020-01-22 DIAGNOSIS — R29898 Other symptoms and signs involving the musculoskeletal system: Secondary | ICD-10-CM

## 2020-01-22 DIAGNOSIS — R278 Other lack of coordination: Secondary | ICD-10-CM | POA: Diagnosis not present

## 2020-01-22 DIAGNOSIS — M25611 Stiffness of right shoulder, not elsewhere classified: Secondary | ICD-10-CM | POA: Diagnosis not present

## 2020-01-22 DIAGNOSIS — M25512 Pain in left shoulder: Secondary | ICD-10-CM | POA: Diagnosis not present

## 2020-01-22 DIAGNOSIS — M25612 Stiffness of left shoulder, not elsewhere classified: Secondary | ICD-10-CM | POA: Diagnosis not present
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
## 2020-01-23 ENCOUNTER — Encounter: Payer: Self-pay | Admitting: Internal Medicine

## 2020-01-23 DIAGNOSIS — I10 Essential (primary) hypertension: Secondary | ICD-10-CM | POA: Insufficient documentation

## 2020-01-23 DIAGNOSIS — Z23 Encounter for immunization: Secondary | ICD-10-CM | POA: Insufficient documentation

## 2020-01-23 HISTORY — DX: Essential (primary) hypertension: I10
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
## 2020-01-23 NOTE — Assessment & Plan Note (Signed)
Care established Previous chart reviewed History and medications reviewed with the patient 

## 2020-01-23 NOTE — Assessment & Plan Note (Signed)
BP Readings from Last 1 Encounters:  01/19/20 (!) 152/91   New-onset, started Lisinopril-HCTZ Counseled for compliance with the medications Advised DASH diet and moderate exercise/walking, at least 150 mins/week
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
## 2020-01-29 ENCOUNTER — Ambulatory Visit (HOSPITAL_COMMUNITY): Payer: Medicaid Other

## 2020-01-29 ENCOUNTER — Telehealth (HOSPITAL_COMMUNITY): Payer: Self-pay
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
## 2020-01-31 ENCOUNTER — Ambulatory Visit (HOSPITAL_COMMUNITY): Payer: Medicaid Other | Attending: Physical Medicine and Rehabilitation

## 2020-01-31 ENCOUNTER — Other Ambulatory Visit: Payer: Self-pay

## 2020-01-31 DIAGNOSIS — Z419 Encounter for procedure for purposes other than remedying health state, unspecified: Secondary | ICD-10-CM | POA: Diagnosis not present

## 2020-01-31 DIAGNOSIS — M25612 Stiffness of left shoulder, not elsewhere classified: Secondary | ICD-10-CM | POA: Insufficient documentation

## 2020-01-31 DIAGNOSIS — R29898 Other symptoms and signs involving the musculoskeletal system: Secondary | ICD-10-CM

## 2020-01-31 DIAGNOSIS — R278 Other lack of coordination: Secondary | ICD-10-CM | POA: Insufficient documentation

## 2020-01-31 DIAGNOSIS — M25611 Stiffness of right shoulder, not elsewhere classified: Secondary | ICD-10-CM | POA: Insufficient documentation

## 2020-01-31 DIAGNOSIS — M25512 Pain in left shoulder: Secondary | ICD-10-CM | POA: Diagnosis not present

## 2020-01-31 NOTE — Patient Instructions (Signed)
Access Code: [email or web address] URL: [email or web address] Date: 01/31/2020 Prepared by: Limmie Patricia  Exercises Estrujamiento de [place] - 1 x daily - 7 x weekly - 1 sets - 15-20 reps Pinza de llave con masilla - 1 x daily - 7 x weekly - 1 sets - 15-20 reps Pellizco de 3 puntos con masilla - 1 x daily - 7 x weekly - 1 sets - 15-20 reps

## 2020-02-01 NOTE — Therapy (Addendum)
[place], [place], [address] Phone: [phone]   Fax:  [phone]  Occupational Therapy Re-eval  Patient Details  Name: Shelly Houston MRN: [id number] Date of Birth: May 29, 1970 Referring Provider (OT): Reesa Chew, [place]   Encounter Date: 01/31/2020   OT End of Session - 01/31/20 1637    Visit Number 12    Number of Visits 24    Date for OT Re-Evaluation 03/21/20    Authorization Type [employer]    Authorization Time Period 12 visits approved from 12/01/19- 01/30/20 *Requesting 12 visits on 01/31/20    Authorization - Visit Number --    Authorization - Number of Visits --    OT Start Time 1430   re-eval   OT Stop Time 1515    OT Time Calculation (min) 45 min    Activity Tolerance Patient tolerated treatment well    Behavior During Therapy [facility] for tasks assessed/performed           Past Medical History:  Diagnosis Date  . Central cord syndrome ([place])   . Primary hypertension 01/23/2020    Past Surgical History:  Procedure Laterality Date  . ABDOMINAL HYSTERECTOMY    . head surgery  2021    There were no vitals filed for this visit.   Subjective Assessment - 02/01/20 0817    Subjective  S: I haven't been using my left thumb for anything so I didn't even realize that I could move it.    Patient is accompanied by: Interpreter    Currently in Pain? No/denies              [facility] OT Assessment - 01/31/20 1434      Assessment   Medical Diagnosis Central cord syndrome    Referring Provider (OT) Reesa Chew, PA-C    Onset Date/Surgical Date 08/03/19      Precautions   Precautions Cervical;Back;Other (comment)    Precaution Comments Minerva brace with C collar on at all times except to shower.     Required Braces or Orthoses Cervical Brace;Spinal Brace;Other Brace/Splint    Cervical Brace Hard collar;At all times      Prior Function   Level of Independence Independent      Coordination    Left 9 Hole Peg Test 42.4"   previous: 59.37"     AROM   Overall AROM Comments Assessed seated. IR/er adducted    AROM Assessment Site Shoulder    Right/Left Shoulder Left;Right    Right Shoulder Flexion 137 Degrees    Right Shoulder ABduction 140 Degrees   previous: 85   Right Shoulder Internal Rotation 90 Degrees   previous: same   Right Shoulder External Rotation 80 Degrees   previous: same   Left Shoulder Flexion 102 Degrees   previous: 80   Left Shoulder ABduction 90 Degrees   previous: 75   Left Shoulder Internal Rotation 90 Degrees   previous: same   Left Shoulder External Rotation 75 Degrees   previous: 54   Left Wrist Flexion 78 Degrees   previous: 64     PROM   Overall PROM  Within functional limits for tasks performed      Strength   Strength Assessment Site Hand;Shoulder;Elbow;Forearm;Wrist    Right/Left Shoulder Right;Left    Right Shoulder Flexion 3+/5   previous: 3/5   Right Shoulder ABduction 3+/5   previous: 3/5   Right Shoulder Internal Rotation 4-/5   previous: 3/5   Right Shoulder External  Rotation 4-/5   previous: same   Left Shoulder Flexion 3/5   previous: same   Left Shoulder ABduction 3/5   previous: 3-/5   Left Shoulder Internal Rotation 3+/5   previous: 3/5   Left Shoulder External Rotation 3+/5   previous: 3/5   Right Forearm Pronation 4-/5   previous: 3+/5   Right Forearm Supination 4-/5   previous: 3+/5   Left Forearm Pronation 3+/5   previous: 3/5   Left Forearm Supination 3+/5   previous: 3/5   Right Wrist Flexion 4-/5   previous: 3+/5   Right Wrist Extension 4-/5   previous: 3+/5   Right Wrist Radial Deviation 4-/5   previous: 3+/5   Right Wrist Ulnar Deviation 4-/5   previous: 3+/5   Left Wrist Flexion 3/5   previous: same   Left Wrist Extension 3/5   previous: same   Left Wrist Radial Deviation 3/5   previous: same   Left Wrist Ulnar Deviation 3/5   previous: same   Right/Left hand Left;Right    Right Hand Grip (lbs) 50   previous: 40    Right Hand Lateral Pinch 16 lbs   previous: 13   Right Hand 3 Point Pinch 14 lbs   previous: 11   Left Hand Grip (lbs) 15   previous: 10   Left Hand Lateral Pinch 6 lbs   previous: 4   Left Hand 3 Point Pinch 6 lbs   previous: 3                           OT Education - 02/01/20 0818    Education Details Reviewed therapy goals and progress. Discussed that since she has not had a follow up with the orthopedic MD regarding her left scaphoid and radius fracture we won't know if they have healed correctly or not. She is now 6 months post fracture. According to a basic protocol for each type of fracture she would be at the point where she can complete exercises and activities with the left hand without any precautions. We can start working on the left hand safely in therapy. Provided yellow putty for the left hand and red putty for the right hand with a hand strengthening HEP provided.    Person(s) Educated Patient    Methods Explanation;Demonstration;Tactile cues;Verbal cues;Handout    Comprehension Verbalized understanding            OT Short Term Goals - 01/31/20 1717      OT SHORT TERM GOAL #1   Title Patient will be educated and independent with HEP in order to increase functional use of both arms while completing basic self care tasks.    Time 3    Period Weeks    Target Date 12/19/19      OT SHORT TERM GOAL #2   Title Patient will increase her BUE P/ROM to [facility] in order to increase ability to complete upper body dressing tasks with less difficulty.    Time 3    Period Weeks    Status Achieved      OT SHORT TERM GOAL #3   Title Patient will increase bilateral hand grip strength by 5# and pinch strength by 2# in order to increase ability to open jars and containers that are not tight.    Time 3    Period Weeks      OT SHORT TERM GOAL #4   Title Patient will increase her left hand  coordination by completing the 9 hole peg test in under 1 minute in order to  participate in simple housekeeping tasks with less difficulty.    Time 3    Period Weeks             OT Long Term Goals - 01/31/20 1717      OT LONG TERM GOAL #1   Title Patient will return to using both arms for all daily tasks while using her LUE for 75% of more of daily tasks.    Time 8    Period Weeks    Status On-going      OT LONG TERM GOAL #2   Title Patient will report a decrease in pain in BUE during functional tasks of approximately 3/10 or less.    Time 8    Period Weeks    Status Achieved      OT LONG TERM GOAL #3   Title Patient will increase BUE shoulder, wrist, and hand A/ROM to WNL or as close as possible in order to decrease difficulty level during dressing and functional reaching tasks.    Time 8    Period Weeks    Status On-going      OT LONG TERM GOAL #4   Title Patient will increase grip strength in the right hand to 45#, left hand to 10#, and left pinch strength to 5# in order to increase ability to manipulate and open jars and containers without needing assistance.    Baseline Update 12/1:Patient has met goal for grip and pinch. Goal updated to continue progress in the therapy. See goal #9.    Time 8    Period Weeks    Status Achieved      OT LONG TERM GOAL #5   Title Patient will decrease the bilateral UE fascial restrictions to min amount or less in order to increase her functional mobility needed to complete desire housekeeping tasks.    Time 8    Period Weeks    Status Achieved      OT LONG TERM GOAL #6   Title Patient will increase her left hand coordination by completing the 9 hole peg test in 45" or better to allow her to manipulate small items at home such as buttons, zippers, etc.    Baseline Update 12/1: Patient has met goal for coordination. Goal updated to continue progress. See goal #8    Time 8    Period Weeks    Status Achieved      OT LONG TERM GOAL #7   Title Patient will improve BUE strength to at least a 4-/5 in order to  promote patient's ability to complete daily self-care tasks with less difficulty.    Time 8    Period Weeks    Status On-going      OT LONG TERM GOAL #8   Title Patient will increase her left hand coordination by completing the 9 hole peg test in 30" or better to allow her to manipulate small items at home such as buttons, zippers, etc.    Baseline 12/1: 45"    Time 6    Period Weeks    Status New    Target Date 03/21/20      OT LONG TERM GOAL  #9   TITLE Patient will increase grip strength in the left hand to 25#, and left pinch strength to 10# in order to increase ability to manipulate and open jars and containers without needing assistance.  Baseline 12/1: left grip strength: 15#. left pinch ( 3point and lateral): 6#    Time 6    Period Weeks    Status New    Target Date 03/21/20                 Plan - 01/31/20 1717    Clinical Impression Statement A: Re-eval completed this date. Patient has demonstrated progress in therapy overall. Pt has met all short term goals and 4/7 LTGs. She reports difficulty with follow up visit with orthopedic MD for her left wrist which has delayed therapy treatment to the wrist. Discussed plan for wrist treatment now since she is 6 months since injury (see education section). RUE has Functional ROM and strength although LUE continues to present with limited A/ROM and strength with elbow, wrist , and hand involved. Sessions have been focusing on nueromusclar strengthening and functional use of her LUE. Requires physical and verbal cueing as well as joint positioning assistance for form and technique during sessions.  Upgraded Long term goals for left hand strength and coordination; See goal 8 and 9.  Pt will benefit from continued OT services to focus on remaining goals - strength, ROM, and coordination.    Body Structure / Function / Physical Skills ADL;Fascial restriction;UE functional use;Pain;FMC;ROM;GMC;Coordination;Decreased knowledge of  precautions    OT Frequency 2x / week    OT Duration 6 weeks (Start on 02/12/20 to allow time for insurance authorization)   Plan P: Continue skilled OT services focusing on deficits mentioned above. Next session: follow up on hand HEP. Assess need for myofascial release to the left wrist, forearm, and hand. Complete passive stretching. Start ROM and hand strength activities.    OT Home Exercise Plan eval: table slides (flexion, abduction), 10/4: AA/ROM wrist and elbow (flexion, extension) 11/8: AA/ROM supine and seated 11/15: A/ROM shoulder exercises.12/1: yellow putty (left hand) and red putty (right hand) for grip and pinch.    Consulted and Agree with Plan of Care Patient           Patient will benefit from skilled therapeutic intervention in order to improve the following deficits and impairments:   Body Structure / Function / Physical Skills: ADL, Fascial restriction, UE functional use, Pain, FMC, ROM, GMC, Coordination, Decreased knowledge of precautions       Visit Diagnosis: Stiffness of right shoulder, not elsewhere classified - Plan: Ot plan of care cert/re-cert  Stiffness of left shoulder, not elsewhere classified - Plan: Ot plan of care cert/re-cert  Acute pain of left shoulder - Plan: Ot plan of care cert/re-cert  Other lack of coordination - Plan: Ot plan of care cert/re-cert  Other symptoms and signs involving the musculoskeletal system - Plan: Ot plan of care cert/re-cert    Problem List Patient Active Problem List   Diagnosis Date Noted  . Primary hypertension 01/23/2020  . Need for immunization against influenza 01/23/2020  . Encounter to establish care 01/19/2020  . Acute blood loss anemia 09/04/2019  . Drainage from wound 09/04/2019  . 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 Neuropathy 09/04/2019  . Neurogenic bowel 09/04/2019  . Central cord syndrome ([place]) 08/07/2019  . Thoracic spine fracture [facility]) 08/03/2019   Ailene Ravel, OTR/L,CBIS    [phone]  02/01/2020, 10:10 AM  [place], [place], [address] Phone: [phone]   Fax:  [phone]  Name: JAYDAN CHRETIEN MRN: [id number] Date of Birth: November 26, 1970

## 2020-02-12 ENCOUNTER — Telehealth (HOSPITAL_COMMUNITY): Payer: Self-pay | Admitting: Physical Therapy

## 2020-02-12 ENCOUNTER — Ambulatory Visit (HOSPITAL_COMMUNITY): Payer: Medicaid Other

## 2020-02-14 ENCOUNTER — Ambulatory Visit (HOSPITAL_COMMUNITY): Payer: Medicaid Other

## 2020-02-16 ENCOUNTER — Other Ambulatory Visit: Payer: Self-pay

## 2020-02-16 ENCOUNTER — Encounter: Payer: Self-pay | Admitting: Internal Medicine

## 2020-02-16 ENCOUNTER — Ambulatory Visit: Payer: Medicaid Other | Admitting: Internal Medicine

## 2020-02-16 VITALS — BP 135/89 | HR 71 | Temp 97.3°F | Resp 18 | Ht 62.0 in | Wt 142.1 lb

## 2020-02-16 DIAGNOSIS — I10 Essential (primary) hypertension: Secondary | ICD-10-CM | POA: Diagnosis not present

## 2020-02-16 DIAGNOSIS — S14129S Central cord syndrome at unspecified level of cervical spinal cord, sequela: Secondary | ICD-10-CM

## 2020-02-16 DIAGNOSIS — S22008D Other fracture of unspecified thoracic vertebra, subsequent encounter for fracture with routine healing: Secondary | ICD-10-CM | POA: Diagnosis not present

## 2020-02-16 DIAGNOSIS — Z7689 Persons encountering health services in other specified circumstances: Secondary | ICD-10-CM | POA: Diagnosis not present

## 2020-02-16 DIAGNOSIS — E559 Vitamin D deficiency, unspecified: Secondary | ICD-10-CM | POA: Diagnosis not present

## 2020-02-16 NOTE — Assessment & Plan Note (Signed)
BP Readings from Last 1 Encounters:  02/16/20 135/89   Well-controlled with Losartan-HCTZ Counseled for compliance with the medications Advised DASH diet and moderate exercise/walking, at least 150 mins/week
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
## 2020-02-16 NOTE — Patient Instructions (Addendum)
Please continue taking your medications regularly.  Please follow up with your Neurosurgeon as scheduled.  Please continue to participate in physical therapy.
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
## 2020-02-17 LAB — LIPID PANEL
Chol/HDL Ratio: 5 ratio — ABNORMAL HIGH (ref 0.0–4.4)
Cholesterol, Total: 198 mg/dL (ref 100–199)
HDL: 40 mg/dL (ref >39)
LDL Chol Calc (NIH): 114 mg/dL — ABNORMAL HIGH (ref 0–99)
Triglycerides: 252 mg/dL — ABNORMAL HIGH (ref 0–149)
VLDL Cholesterol Cal: 44 mg/dL — ABNORMAL HIGH (ref 5–40)

## 2020-02-17 LAB — CBC WITH DIFFERENTIAL/PLATELET
Basophils Absolute: 0.1 10*3/uL (ref 0.0–0.2)
Basos: 1 %
EOS (ABSOLUTE): 0.1 10*3/uL (ref 0.0–0.4)
Eos: 2 %
Hematocrit: 47.2 % — ABNORMAL HIGH (ref 34.0–46.6)
Hemoglobin: 16.4 g/dL — ABNORMAL HIGH (ref 11.1–15.9)
Immature Grans (Abs): 0 10*3/uL (ref 0.0–0.1)
Immature Granulocytes: 0 %
Lymphocytes Absolute: 2 10*3/uL (ref 0.7–3.1)
Lymphs: 31 %
MCH: 30.8 pg (ref 26.6–33.0)
MCHC: 34.7 g/dL (ref 31.5–35.7)
MCV: 89 fL (ref 79–97)
Monocytes Absolute: 0.5 10*3/uL (ref 0.1–0.9)
Monocytes: 8 %
Neutrophils Absolute: 3.9 10*3/uL (ref 1.4–7.0)
Neutrophils: 58 %
Platelets: 243 10*3/uL (ref 150–450)
RBC: 5.32 x10E6/uL — ABNORMAL HIGH (ref 3.77–5.28)
RDW: 11.9 % (ref 11.7–15.4)
WBC: 6.6 10*3/uL (ref 3.4–10.8)

## 2020-02-17 LAB — CMP14+EGFR
ALT: 21 IU/L (ref 0–32)
AST: 17 IU/L (ref 0–40)
Albumin/Globulin Ratio: 1.5 (ref 1.2–2.2)
Albumin: 4.6 g/dL (ref 3.8–4.9)
Alkaline Phosphatase: 93 IU/L (ref 44–121)
BUN/Creatinine Ratio: 18 (ref 9–23)
BUN: 10 mg/dL (ref 6–24)
Bilirubin Total: 0.5 mg/dL (ref 0.0–1.2)
CO2: 22 mmol/L (ref 20–29)
Calcium: 9.8 mg/dL (ref 8.7–10.2)
Chloride: 101 mmol/L (ref 96–106)
Creatinine, Ser: 0.57 mg/dL (ref 0.57–1.00)
GFR calc Af Amer: 124 mL/min/{1.73_m2} (ref >59)
GFR calc non Af Amer: 108 mL/min/{1.73_m2} (ref >59)
Globulin, Total: 3 g/dL (ref 1.5–4.5)
Glucose: 103 mg/dL — ABNORMAL HIGH (ref 65–99)
Potassium: 3.4 mmol/L — ABNORMAL LOW (ref 3.5–5.2)
Sodium: 138 mmol/L (ref 134–144)
Total Protein: 7.6 g/dL (ref 6.0–8.5)

## 2020-02-17 LAB — TSH+FREE T4
Free T4: 1.28 ng/dL (ref 0.82–1.77)
TSH: 1.79 u[IU]/mL (ref 0.450–4.500)

## 2020-02-17 LAB — VITAMIN D 25 HYDROXY (VIT D DEFICIENCY, FRACTURES): Vit D, 25-Hydroxy: 31.7 ng/mL (ref 30.0–100.0)

## 2020-02-17 LAB — HEMOGLOBIN A1C
Est. average glucose Bld gHb Est-mCnc: 100 mg/dL
Hgb A1c MFr Bld: 5.1 % (ref 4.8–5.6)

## 2020-02-19 ENCOUNTER — Encounter: Payer: Medicaid Other | Admitting: Nurse Practitioner

## 2020-02-20 ENCOUNTER — Encounter (HOSPITAL_COMMUNITY): Payer: Self-pay

## 2020-02-20 ENCOUNTER — Telehealth (HOSPITAL_COMMUNITY): Payer: Self-pay

## 2020-02-20 ENCOUNTER — Ambulatory Visit (HOSPITAL_COMMUNITY): Payer: Medicaid Other

## 2020-02-20 DIAGNOSIS — I10 Essential (primary) hypertension: Secondary | ICD-10-CM | POA: Diagnosis not present

## 2020-02-20 DIAGNOSIS — S22030D Wedge compression fracture of third thoracic vertebra, subsequent encounter for fracture with routine healing: Secondary | ICD-10-CM | POA: Diagnosis not present

## 2020-02-20 DIAGNOSIS — Z6827 Body mass index (BMI) 27.0-27.9, adult: Secondary | ICD-10-CM | POA: Diagnosis not present

## 2020-02-20 NOTE — Telephone Encounter (Signed)
pt cancelled appt on the phone tree, tried to contact pt with no answer and no vmb

## 2020-02-22 ENCOUNTER — Other Ambulatory Visit: Payer: Self-pay

## 2020-02-22 ENCOUNTER — Ambulatory Visit (HOSPITAL_COMMUNITY): Payer: Medicaid Other

## 2020-02-22 ENCOUNTER — Encounter (HOSPITAL_COMMUNITY): Payer: Self-pay

## 2020-02-22 DIAGNOSIS — R29898 Other symptoms and signs involving the musculoskeletal system: Secondary | ICD-10-CM | POA: Diagnosis not present

## 2020-02-22 DIAGNOSIS — M25612 Stiffness of left shoulder, not elsewhere classified: Secondary | ICD-10-CM

## 2020-02-22 DIAGNOSIS — M25611 Stiffness of right shoulder, not elsewhere classified: Secondary | ICD-10-CM

## 2020-02-22 DIAGNOSIS — R278 Other lack of coordination: Secondary | ICD-10-CM | POA: Diagnosis not present

## 2020-02-22 DIAGNOSIS — M25512 Pain in left shoulder: Secondary | ICD-10-CM | POA: Diagnosis not present

## 2020-02-22 NOTE — Patient Instructions (Addendum)
General Finger Flexion/Fist  Close the hand into a fist as far as possible.  Use the thumb of the opposite hand to push the fingers into a tighter grip.  Let pain be your guide stretching as aggressively as you can. Hold for 5 seconds before relaxing    Access Code: [email or web address] URL: [email or web address] Date: 02/22/2020 Prepared by: Limmie Patricia  Exercises Wrist Flexion Extension AROM with Fingers Curled and Palm Down - 1 x daily - 7 x weekly - 1 sets - 10-15 reps Finger O - 1 x daily - 7 x weekly - 1 sets - 10-15 reps Finger Spreading - 1 x daily - 7 x weekly - 1 sets - 10-15 reps

## 2020-02-22 NOTE — Therapy (Signed)
[place], [place], [address] Phone: [phone]   Fax:  [phone]  Occupational Therapy Treatment  Patient Details  Name: Shelly Houston MRN: [id number] Date of Birth: March 22, 1970 Referring Provider (OT): -- (send all re-certs to Dr. Ihor Dow per Reesa Chew, PA-C)   Encounter Date: 02/22/2020   OT End of Session - 02/22/20 1600    Visit Number 13    Number of Visits 24    Date for OT Re-Evaluation 03/21/20    Authorization Type [employer]    Authorization Time Period 6 visits approved (02/12/20-03/22/20)    Authorization - Visit Number 1    Authorization - Number of Visits 6    OT Start Time 1430    OT Stop Time 1515    OT Time Calculation (min) 45 min    Activity Tolerance Patient tolerated treatment well    Behavior During Therapy [facility] for tasks assessed/performed           Past Medical History:  Diagnosis Date  . Central cord syndrome ([place])   . Primary hypertension 01/23/2020    Past Surgical History:  Procedure Laterality Date  . ABDOMINAL HYSTERECTOMY    . head surgery  2021    There were no vitals filed for this visit.   Subjective Assessment - 02/22/20 1443    Subjective  S: I have been working on my hand at home.    Currently in Pain? No/denies              [facility] OT Assessment - 02/22/20 1444      Assessment   Medical Diagnosis Central cord syndrome    Referring Provider (OT) --   send all re-certs to Dr. Ihor Dow per Reesa Chew, PA-C     Precautions   Precautions Cervical;Back;Other (comment)    Precaution Comments Minerva brace with C collar on at all times except to shower.     Required Braces or Orthoses Cervical Brace;Spinal Brace;Other Brace/Splint    Cervical Brace Hard collar;At all times                    OT Treatments/Exercises (OP) - 02/22/20 1449      Exercises   Exercises Hand;Wrist      Wrist Exercises   Other wrist exercises A/ROM  wrist flexion against gravity (LUE on tabletop- hand open)10X, A/ROM wrist flexion/extension with LUE extended at 90 degrees of shoulder flexion (therapist provided support to maintain) 10X with hand open. A/ROM wrist flexion/extension with extended arm position at 90 degrees and hand in closed fist 10X      Additional Wrist Exercises   Sponges 13,13      Hand Exercises   Opposition AROM;5 reps    Digit Abduction/Adduction 10X A/ROM    Other Hand Exercises Passive composite digit flexion and extension with prolonged hold in flexion; 10X    Other Hand Exercises Finger taps on table; 5X      Neurological Re-education Exercises   Thumb Opposition P/ROM thumb flexion/extension; 10X    Other Exercises 1 yellow resistive clothespin utilized in left hand with 3 point pinch to pick up and place 25 sponges in container while extending LUE out to reach container.    Other Exercises 2 Focusing on functional pinch strength, patient used left hand to place all yellow and red pins on vertical pole. Started task seated to focus on shoulder functional reaching until unable then stood to finish. While seated,  placed green resistive clothespins on middle horizontal pole.      Manual Therapy   Manual Therapy Soft tissue mobilization    Manual therapy comments manual therapy completed seperately from all other interventions this date    Soft tissue mobilization Soft tissue mobilization and passive stretching completed to left hand to increase joint mobility and decrease restricitons.                  OT Education - 02/22/20 1558    Education Details Added to present HEP. Added wrist extension A/ROM with closed fist, finger opposition, passive stretching of hand into closed fist, and finger abduction/adduction    Person(s) Educated Patient    Methods Explanation;Handout;Verbal cues;Demonstration    Comprehension Verbalized understanding;Returned demonstration            OT Short Term Goals -  02/22/20 1605      OT SHORT TERM GOAL #1   Title Patient will be educated and independent with HEP in order to increase functional use of both arms while completing basic self care tasks.    Time 3    Period Weeks    Target Date 12/19/19      OT SHORT TERM GOAL #2   Title Patient will increase her BUE P/ROM to [facility] in order to increase ability to complete upper body dressing tasks with less difficulty.    Time 3    Period Weeks      OT SHORT TERM GOAL #3   Title Patient will increase bilateral hand grip strength by 5# and pinch strength by 2# in order to increase ability to open jars and containers that are not tight.    Time 3    Period Weeks      OT SHORT TERM GOAL #4   Title Patient will increase her left hand coordination by completing the 9 hole peg test in under 1 minute in order to participate in simple housekeeping tasks with less difficulty.    Time 3    Period Weeks             OT Long Term Goals - 02/22/20 1606      OT LONG TERM GOAL #1   Title Patient will return to using both arms for all daily tasks while using her LUE for 75% of more of daily tasks.    Time 8    Period Weeks    Status On-going      OT LONG TERM GOAL #2   Title Patient will report a decrease in pain in BUE during functional tasks of approximately 3/10 or less.    Time 8    Period Weeks      OT LONG TERM GOAL #3   Title Patient will increase BUE shoulder, wrist, and hand A/ROM to WNL or as close as possible in order to decrease difficulty level during dressing and functional reaching tasks.    Time 8    Period Weeks    Status On-going      OT LONG TERM GOAL #4   Title Patient will increase grip strength in the right hand to 45#, left hand to 10#, and left pinch strength to 5# in order to increase ability to manipulate and open jars and containers without needing assistance.    Baseline Update 12/1:Patient has met goal for grip and pinch. Goal updated to continue progress in the therapy.  See goal #9.    Time 8    Period Weeks  OT LONG TERM GOAL #5   Title Patient will decrease the bilateral UE fascial restrictions to min amount or less in order to increase her functional mobility needed to complete desire housekeeping tasks.    Time 8    Period Weeks      OT LONG TERM GOAL #6   Title Patient will increase her left hand coordination by completing the 9 hole peg test in 45" or better to allow her to manipulate small items at home such as buttons, zippers, etc.    Baseline Update 12/1: Patient has met goal for coordination. Goal updated to continue progress. See goal #8    Time 8    Period Weeks      OT LONG TERM GOAL #7   Title Patient will improve BUE strength to at least a 4-/5 in order to promote patient's ability to complete daily self-care tasks with less difficulty.    Time 8    Period Weeks    Status On-going      OT LONG TERM GOAL #8   Title Patient will increase her left hand coordination by completing the 9 hole peg test in 30" or better to allow her to manipulate small items at home such as buttons, zippers, etc.    Baseline 12/1: 45"    Time 6    Period Weeks    Status On-going      OT LONG TERM GOAL  #9   TITLE Patient will increase grip strength in the left hand to 25#, and left pinch strength to 10# in order to increase ability to manipulate and open jars and containers without needing assistance.    Baseline 12/1: left grip strength: 15#. left pinch ( 3point and lateral): 6#    Time 6    Period Weeks    Status On-going                 Plan - 02/22/20 1600    Clinical Impression Statement A: Focused on hand and wrist strengthening with use of resistive clothespin and sponges. During manual techniques, patient demonstrates mainly joint limitations in the PIP joints of her left hand which creates difficulty when attempting to form a complete fist. Did achieve more ROM with passive ROM stretching. Patient reports no pain in hand during  session. VC for form and technique were provided. No interpreter was present and patient was able to complete all activities as directed with additional visual demonstration if needed.    Body Structure / Function / Physical Skills ADL;Fascial restriction;UE functional use;Pain;FMC;ROM;GMC;Coordination;Decreased knowledge of precautions    Plan P: Continue with hand strengthening and coordination while incorporating shoulder and elbow as able. Suggestion: with forearm and/or elbow resting on table focus on wrist extension and coordination while stacking cubes 3-4 high.    OT Home Exercise Plan eval: table slides (flexion, abduction), 10/4: AA/ROM wrist and elbow (flexion, extension) 11/8: AA/ROM supine and seated 11/15: A/ROM shoulder exercises.12/1: yellow putty (left hand) and red putty (right hand) for grip and pinch. 12/23: passive composite digit flexion, finger opposition, finger adduction/abduction, wrist extension A/ROM    Consulted and Agree with Plan of Care Patient           Patient will benefit from skilled therapeutic intervention in order to improve the following deficits and impairments:   Body Structure / Function / Physical Skills: ADL,Fascial restriction,UE functional use,Pain,FMC,ROM,GMC,Coordination,Decreased knowledge of precautions       Visit Diagnosis: Stiffness of right shoulder, not elsewhere classified  Stiffness of left shoulder, not elsewhere classified  Acute pain of left shoulder  Other lack of coordination  Other symptoms and signs involving the musculoskeletal system    Problem List Patient Active Problem List   Diagnosis Date Noted  . Primary hypertension 01/23/2020  . Need for immunization against influenza 01/23/2020  . Encounter to establish care 01/19/2020  . Acute blood loss anemia 09/04/2019  . Drainage from wound 09/04/2019  . 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 Neuropathy 09/04/2019  . Neurogenic bowel 09/04/2019  . Central  cord syndrome ([place]) 08/07/2019  . Thoracic spine fracture [facility]) 08/03/2019   Ailene Ravel, OTR/L,CBIS  [phone]  02/22/2020, 4:07 PM  [facility] [address] [place], [place], [address] Phone: [phone]   Fax:  [phone]  Name: Shelly Houston MRN: [id number] Date of Birth: Sep 04, 1970

## 2020-02-27 ENCOUNTER — Ambulatory Visit (HOSPITAL_COMMUNITY): Payer: Medicaid Other | Admitting: Occupational Therapy

## 2020-02-27 ENCOUNTER — Other Ambulatory Visit: Payer: Self-pay

## 2020-02-27 ENCOUNTER — Encounter (HOSPITAL_COMMUNITY): Payer: Self-pay | Admitting: Occupational Therapy

## 2020-02-27 DIAGNOSIS — M25612 Stiffness of left shoulder, not elsewhere classified: Secondary | ICD-10-CM | POA: Diagnosis not present

## 2020-02-27 DIAGNOSIS — R29898 Other symptoms and signs involving the musculoskeletal system: Secondary | ICD-10-CM | POA: Diagnosis not present

## 2020-02-27 DIAGNOSIS — R278 Other lack of coordination: Secondary | ICD-10-CM | POA: Diagnosis not present

## 2020-02-27 DIAGNOSIS — M25512 Pain in left shoulder: Secondary | ICD-10-CM | POA: Diagnosis not present

## 2020-02-27 DIAGNOSIS — M25611 Stiffness of right shoulder, not elsewhere classified: Secondary | ICD-10-CM | POA: Diagnosis not present

## 2020-02-27 NOTE — Therapy (Signed)
[place], [place], [address] Phone: [phone]   Fax:  [phone]  Occupational Therapy Treatment  Patient Details  Name: Shelly Houston MRN: [id number] Date of Birth: 08/15/1970 Referring Provider (OT): -- (send all re-certs to Dr. Ihor Dow per Reesa Chew, PA-C)   Encounter Date: 02/27/2020   OT End of Session - 02/27/20 1510    Visit Number 14    Number of Visits 24    Date for OT Re-Evaluation 03/21/20    Authorization Type [employer]    Authorization Time Period 6 visits approved (02/12/20-03/22/20)    Authorization - Visit Number 2    Authorization - Number of Visits 6    OT Start Time 5027   pt arrived late   OT Stop Time 1513    OT Time Calculation (min) 29 min    Activity Tolerance Patient tolerated treatment well    Behavior During Therapy [facility] for tasks assessed/performed           Past Medical History:  Diagnosis Date  . Central cord syndrome ([place])   . Primary hypertension 01/23/2020    Past Surgical History:  Procedure Laterality Date  . ABDOMINAL HYSTERECTOMY    . head surgery  2021    There were no vitals filed for this visit.       [facility] OT Assessment - 02/27/20 1443      Assessment   Medical Diagnosis Central cord syndrome      Precautions   Precautions Cervical;Back;Other (comment)    Precaution Comments Minerva brace with C collar on at all times except to shower.     Required Braces or Orthoses Cervical Brace;Spinal Brace;Other Brace/Splint    Cervical Brace Hard collar;At all times                    OT Treatments/Exercises (OP) - 02/27/20 1443      Exercises   Exercises Hand;Wrist;Theraputty      Additional Elbow Exercises   Sponges 12, 14, 15      Additional Wrist Exercises   Hand Gripper with Large Beads all beads gripper at 20#    Hand Gripper with Medium Beads all beads gripper at 7#      Hand Exercises   Other Hand Exercises Passive  composite digit flexion and extension with prolonged hold in flexion; 10X    Other Hand Exercises Pt using pvc pipe to cut circles into red theraputty working on grip strength and incorporating wrist mobility      Theraputty   Theraputty - Flatten red      Fine Motor Coordination (Hand/Wrist)   Fine Motor Coordination Tendon glides    Tendon Glides 10X A/ROM                    OT Short Term Goals - 02/22/20 1605      OT SHORT TERM GOAL #1   Title Patient will be educated and independent with HEP in order to increase functional use of both arms while completing basic self care tasks.    Time 3    Period Weeks    Target Date 12/19/19      OT SHORT TERM GOAL #2   Title Patient will increase her BUE P/ROM to [facility] in order to increase ability to complete upper body dressing tasks with less difficulty.    Time 3    Period Weeks      OT SHORT TERM  GOAL #3   Title Patient will increase bilateral hand grip strength by 5# and pinch strength by 2# in order to increase ability to open jars and containers that are not tight.    Time 3    Period Weeks      OT SHORT TERM GOAL #4   Title Patient will increase her left hand coordination by completing the 9 hole peg test in under 1 minute in order to participate in simple housekeeping tasks with less difficulty.    Time 3    Period Weeks             OT Long Term Goals - 02/22/20 1606      OT LONG TERM GOAL #1   Title Patient will return to using both arms for all daily tasks while using her LUE for 75% of more of daily tasks.    Time 8    Period Weeks    Status On-going      OT LONG TERM GOAL #2   Title Patient will report a decrease in pain in BUE during functional tasks of approximately 3/10 or less.    Time 8    Period Weeks      OT LONG TERM GOAL #3   Title Patient will increase BUE shoulder, wrist, and hand A/ROM to WNL or as close as possible in order to decrease difficulty level during dressing and functional  reaching tasks.    Time 8    Period Weeks    Status On-going      OT LONG TERM GOAL #4   Title Patient will increase grip strength in the right hand to 45#, left hand to 10#, and left pinch strength to 5# in order to increase ability to manipulate and open jars and containers without needing assistance.    Baseline Update 12/1:Patient has met goal for grip and pinch. Goal updated to continue progress in the therapy. See goal #9.    Time 8    Period Weeks      OT LONG TERM GOAL #5   Title Patient will decrease the bilateral UE fascial restrictions to min amount or less in order to increase her functional mobility needed to complete desire housekeeping tasks.    Time 8    Period Weeks      OT LONG TERM GOAL #6   Title Patient will increase her left hand coordination by completing the 9 hole peg test in 45" or better to allow her to manipulate small items at home such as buttons, zippers, etc.    Baseline Update 12/1: Patient has met goal for coordination. Goal updated to continue progress. See goal #8    Time 8    Period Weeks      OT LONG TERM GOAL #7   Title Patient will improve BUE strength to at least a 4-/5 in order to promote patient's ability to complete daily self-care tasks with less difficulty.    Time 8    Period Weeks    Status On-going      OT LONG TERM GOAL #8   Title Patient will increase her left hand coordination by completing the 9 hole peg test in 30" or better to allow her to manipulate small items at home such as buttons, zippers, etc.    Baseline 12/1: 45"    Time 6    Period Weeks    Status On-going      OT LONG TERM GOAL  #9   TITLE  Patient will increase grip strength in the left hand to 25#, and left pinch strength to 10# in order to increase ability to manipulate and open jars and containers without needing assistance.    Baseline 12/1: left grip strength: 15#. left pinch ( 3point and lateral): 6#    Time 6    Period Weeks    Status On-going                  Plan - 02/27/20 1508    Clinical Impression Statement A: Pt arrived late for session, therefore no manual techniques completed. Pt with wrist A/ROM WFL, difficulty with full fist at DIP joints. Pt completing grip strengthening using hand gripper with mod difficulty, also completed theraputty activity working on grip strength. Verbal cuing for form and technique.    Body Structure / Function / Physical Skills ADL;Fascial restriction;UE functional use;Pain;FMC;ROM;GMC;Coordination;Decreased knowledge of precautions    Plan P: Continue with hand strengthening, incorporate full LUE into a task such as pinch tree with full LUE extension/flexion    OT Home Exercise Plan eval: table slides (flexion, abduction), 10/4: AA/ROM wrist and elbow (flexion, extension) 11/8: AA/ROM supine and seated 11/15: A/ROM shoulder exercises.12/1: yellow putty (left hand) and red putty (right hand) for grip and pinch. 12/23: passive composite digit flexion, finger opposition, finger adduction/abduction, wrist extension A/ROM    Consulted and Agree with Plan of Care Patient           Patient will benefit from skilled therapeutic intervention in order to improve the following deficits and impairments:   Body Structure / Function / Physical Skills: ADL,Fascial restriction,UE functional use,Pain,FMC,ROM,GMC,Coordination,Decreased knowledge of precautions       Visit Diagnosis: Other lack of coordination  Other symptoms and signs involving the musculoskeletal system    Problem List Patient Active Problem List   Diagnosis Date Noted  . Primary hypertension 01/23/2020  . Need for immunization against influenza 01/23/2020  . Encounter to establish care 01/19/2020  . Acute blood loss anemia 09/04/2019  . Drainage from wound 09/04/2019  . 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 Neuropathy 09/04/2019  . Neurogenic bowel 09/04/2019  . Central cord syndrome ([place]) 08/07/2019  . Thoracic spine  fracture [facility]) 08/03/2019   Guadelupe Sabin, OTR/L  [phone] 02/27/2020, 3:13 PM  [place] [address] [place], [place], [address] Phone: [phone]   Fax:  [phone]  Name: KURSTEN KRUK MRN: [id number] Date of Birth: 1970/11/21

## 2020-02-29 ENCOUNTER — Ambulatory Visit (HOSPITAL_COMMUNITY): Payer: Medicaid Other | Admitting: Occupational Therapy

## 2020-03-02 DIAGNOSIS — Z419 Encounter for procedure for purposes other than remedying health state, unspecified: Secondary | ICD-10-CM | POA: Diagnosis not present

## 2020-03-05 ENCOUNTER — Ambulatory Visit (HOSPITAL_COMMUNITY): Payer: Medicaid Other | Attending: Physical Medicine and Rehabilitation

## 2020-03-05 ENCOUNTER — Other Ambulatory Visit: Payer: Self-pay

## 2020-03-05 ENCOUNTER — Encounter (HOSPITAL_COMMUNITY): Payer: Self-pay

## 2020-03-05 DIAGNOSIS — M25512 Pain in left shoulder: Secondary | ICD-10-CM | POA: Insufficient documentation

## 2020-03-05 DIAGNOSIS — R278 Other lack of coordination: Secondary | ICD-10-CM | POA: Diagnosis not present

## 2020-03-05 DIAGNOSIS — R29898 Other symptoms and signs involving the musculoskeletal system: Secondary | ICD-10-CM | POA: Diagnosis not present

## 2020-03-05 DIAGNOSIS — M25611 Stiffness of right shoulder, not elsewhere classified: Secondary | ICD-10-CM | POA: Diagnosis not present

## 2020-03-05 DIAGNOSIS — M25612 Stiffness of left shoulder, not elsewhere classified: Secondary | ICD-10-CM | POA: Insufficient documentation

## 2020-03-05 NOTE — Patient Instructions (Signed)
Access Code: [email or web address] URL: [email or web address] Date: 03/05/2020 Prepared by: Limmie Patricia  Exercises Seated Cervical Rotation AROM - 1 x daily - 7 x weekly - 1 sets - 10 reps - 2 second hold Seated Cervical Extension AROM - 1 x daily - 7 x weekly - 1 sets - 10 reps - 2 second hold Cervical AROM Flexion and Rotation - 1 x daily - 7 x weekly - 1 sets - 10 reps - 2 second hold Seated Cervical Flexion AROM - 1 x daily - 7 x weekly - 1 sets - 10 reps - 2 second hold

## 2020-03-05 NOTE — Therapy (Signed)
[place], [place], [address] Phone: [phone]   Fax:  [phone]  Occupational Therapy Treatment  Patient Details  Name: Shelly Houston MRN: [id number] Date of Birth: 1970/04/20 Referring Provider (OT): -- (send all re-certs to Dr. Ihor Dow per Reesa Chew, PA-C)   Encounter Date: 03/05/2020   OT End of Session - 03/05/20 1604    Visit Number 15    Number of Visits 24    Date for OT Re-Evaluation 03/21/20    Authorization Type [employer]    Authorization Time Period 6 visits approved (02/12/20-03/22/20)    Authorization - Visit Number 3    Authorization - Number of Visits 6    OT Start Time 6967    OT Stop Time 1518    OT Time Calculation (min) 42 min    Activity Tolerance Patient tolerated treatment well    Behavior During Therapy [facility] for tasks assessed/performed           Past Medical History:  Diagnosis Date  . Central cord syndrome ([place])   . Primary hypertension 01/23/2020    Past Surgical History:  Procedure Laterality Date  . ABDOMINAL HYSTERECTOMY    . head surgery  2021    There were no vitals filed for this visit.   Subjective Assessment - 03/05/20 1552    Subjective  S: Last week I was able to get rid of the neck brace and back brace.    Patient is accompanied by: Interpreter   Rollene Fare   Currently in Pain? No/denies              [facility] OT Assessment - 03/05/20 1449      Assessment   Medical Diagnosis Central cord syndrome      Precautions   Precautions Cervical;Back                    OT Treatments/Exercises (OP) - 03/05/20 1444      Exercises   Exercises Hand;Wrist;Theraputty      Hand Exercises   Other Hand Exercises Utilized pvc pipe to push circles into yellow putty focusing on wrist flexion/extension and grip strength.      Theraputty   Theraputty - Roll yellow    Theraputty - Grip yellow - supinated and pronated    Theraputty - Pinch yellow -  pinched circles made from PVC pipe using 3 point pinch      Fine Motor Coordination (Hand/Wrist)   Fine Motor Coordination Grooved pegs    Grooved pegs Completed fine motor coordination while placing all pegs in pegboard using left hand only to manipulate and rotate. Increased time to complete. Moderate difficulty                  OT Education - 03/05/20 1550    Education Details Pt asked about ways to increase her neck strength as she recently has been able to remove the cervical and thoracic collar/brace. Provided cervical ROM exercises.    Person(s) Educated Patient    Methods Explanation;Demonstration;Handout;Verbal cues    Comprehension Verbalized understanding            OT Short Term Goals - 02/22/20 1605      OT SHORT TERM GOAL #1   Title Patient will be educated and independent with HEP in order to increase functional use of both arms while completing basic self care tasks.    Time 3    Period Weeks  Target Date 12/19/19      OT SHORT TERM GOAL #2   Title Patient will increase her BUE P/ROM to [facility] in order to increase ability to complete upper body dressing tasks with less difficulty.    Time 3    Period Weeks      OT SHORT TERM GOAL #3   Title Patient will increase bilateral hand grip strength by 5# and pinch strength by 2# in order to increase ability to open jars and containers that are not tight.    Time 3    Period Weeks      OT SHORT TERM GOAL #4   Title Patient will increase her left hand coordination by completing the 9 hole peg test in under 1 minute in order to participate in simple housekeeping tasks with less difficulty.    Time 3    Period Weeks             OT Long Term Goals - 02/22/20 1606      OT LONG TERM GOAL #1   Title Patient will return to using both arms for all daily tasks while using her LUE for 75% of more of daily tasks.    Time 8    Period Weeks    Status On-going      OT LONG TERM GOAL #2   Title Patient will report  a decrease in pain in BUE during functional tasks of approximately 3/10 or less.    Time 8    Period Weeks      OT LONG TERM GOAL #3   Title Patient will increase BUE shoulder, wrist, and hand A/ROM to WNL or as close as possible in order to decrease difficulty level during dressing and functional reaching tasks.    Time 8    Period Weeks    Status On-going      OT LONG TERM GOAL #4   Title Patient will increase grip strength in the right hand to 45#, left hand to 10#, and left pinch strength to 5# in order to increase ability to manipulate and open jars and containers without needing assistance.    Baseline Update 12/1:Patient has met goal for grip and pinch. Goal updated to continue progress in the therapy. See goal #9.    Time 8    Period Weeks      OT LONG TERM GOAL #5   Title Patient will decrease the bilateral UE fascial restrictions to min amount or less in order to increase her functional mobility needed to complete desire housekeeping tasks.    Time 8    Period Weeks      OT LONG TERM GOAL #6   Title Patient will increase her left hand coordination by completing the 9 hole peg test in 45" or better to allow her to manipulate small items at home such as buttons, zippers, etc.    Baseline Update 12/1: Patient has met goal for coordination. Goal updated to continue progress. See goal #8    Time 8    Period Weeks      OT LONG TERM GOAL #7   Title Patient will improve BUE strength to at least a 4-/5 in order to promote patient's ability to complete daily self-care tasks with less difficulty.    Time 8    Period Weeks    Status On-going      OT LONG TERM GOAL #8   Title Patient will increase her left hand coordination by completing the 9  hole peg test in 30" or better to allow her to manipulate small items at home such as buttons, zippers, etc.    Baseline 12/1: 45"    Time 6    Period Weeks    Status On-going      OT LONG TERM GOAL  #9   TITLE Patient will increase grip  strength in the left hand to 25#, and left pinch strength to 10# in order to increase ability to manipulate and open jars and containers without needing assistance.    Baseline 12/1: left grip strength: 15#. left pinch ( 3point and lateral): 6#    Time 6    Period Weeks    Status On-going                 Plan - 03/05/20 1604    Clinical Impression Statement A: Focused on functional reaching while incorporating pinch and grip strengthening during session. VC were needed throughout session for form, technique and eliminating any compensatory techniques/movement. Completed fine motor coordination task with increased difficulty requiring increased time to complete. Mentioned difficulty with neck strength with fatigue now that she no longer has to wear the cervical collar. Provided HEP for cervical ROM exercises to help.    Body Structure / Function / Physical Skills ADL;Fascial restriction;UE functional use;Pain;FMC;ROM;GMC;Coordination;Decreased knowledge of precautions    Plan P: Continue with coordination task such as in hand manipulation. (remove grooved pegs from board while attempting to hold them in her palm. )    Consulted and Agree with Plan of Care Patient           Patient will benefit from skilled therapeutic intervention in order to improve the following deficits and impairments:   Body Structure / Function / Physical Skills: ADL,Fascial restriction,UE functional use,Pain,FMC,ROM,GMC,Coordination,Decreased knowledge of precautions       Visit Diagnosis: Other lack of coordination  Other symptoms and signs involving the musculoskeletal system  Stiffness of right shoulder, not elsewhere classified  Stiffness of left shoulder, not elsewhere classified  Acute pain of left shoulder    Problem List Patient Active Problem List   Diagnosis Date Noted  . Primary hypertension 01/23/2020  . Need for immunization against influenza 01/23/2020  . Encounter to establish  care 01/19/2020  . Acute blood loss anemia 09/04/2019  . Drainage from wound 09/04/2019  . 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 Neuropathy 09/04/2019  . Neurogenic bowel 09/04/2019  . Central cord syndrome ([place]) 08/07/2019  . Thoracic spine fracture [facility]) 08/03/2019   Ailene Ravel, OTR/L,CBIS  [phone]  03/05/2020, 4:13 PM  [facility] [address] [place], [place], [address] Phone: [phone]   Fax:  [phone]  Name: Shelly Houston MRN: [id number] Date of Birth: 24-Oct-1970

## 2020-03-07 ENCOUNTER — Other Ambulatory Visit: Payer: Self-pay

## 2020-03-07 ENCOUNTER — Encounter (HOSPITAL_COMMUNITY): Payer: Self-pay

## 2020-03-07 ENCOUNTER — Ambulatory Visit (HOSPITAL_COMMUNITY): Payer: Medicaid Other

## 2020-03-07 DIAGNOSIS — M25612 Stiffness of left shoulder, not elsewhere classified: Secondary | ICD-10-CM | POA: Diagnosis not present

## 2020-03-07 DIAGNOSIS — M25611 Stiffness of right shoulder, not elsewhere classified: Secondary | ICD-10-CM

## 2020-03-07 DIAGNOSIS — R29898 Other symptoms and signs involving the musculoskeletal system: Secondary | ICD-10-CM

## 2020-03-07 DIAGNOSIS — M25512 Pain in left shoulder: Secondary | ICD-10-CM | POA: Diagnosis not present

## 2020-03-07 DIAGNOSIS — R278 Other lack of coordination: Secondary | ICD-10-CM | POA: Diagnosis not present

## 2020-03-07 NOTE — Therapy (Signed)
[place], [place], [address] Phone: [phone]   Fax:  [phone]  Occupational Therapy Treatment  Patient Details  Name: Shelly Houston MRN: [id number] Date of Birth: Dec 18, 1970 Referring Provider (OT): -- (send all re-certs to Dr. Ihor Dow per Reesa Chew, PA-C)   Encounter Date: 03/07/2020   OT End of Session - 03/07/20 1545    Visit Number 16    Number of Visits 24    Date for OT Re-Evaluation 03/21/20    Authorization Type [employer]    Authorization Time Period 6 visits approved (02/12/20-03/22/20)    Authorization - Visit Number 4    Authorization - Number of Visits 6    OT Start Time 1430    OT Stop Time 1510    OT Time Calculation (min) 40 min    Activity Tolerance Patient tolerated treatment well    Behavior During Therapy [facility] for tasks assessed/performed           Past Medical History:  Diagnosis Date  . Central cord syndrome ([place])   . Primary hypertension 01/23/2020    Past Surgical History:  Procedure Laterality Date  . ABDOMINAL HYSTERECTOMY    . head surgery  2021    There were no vitals filed for this visit.   Subjective Assessment - 03/07/20 1435    Currently in Pain? No/denies              [facility] OT Assessment - 03/07/20 1437      Assessment   Medical Diagnosis Central cord syndrome      Precautions   Precautions Cervical;Back                    OT Treatments/Exercises (OP) - 03/07/20 1437      Exercises   Exercises Hand;Wrist;Theraputty      Wrist Exercises   Other wrist exercises Wrist extension strengthening completed with forearm resting on blue bolster to support. A/ROM 10X. Unable to achieve any movement holding 1# hand weight. 1/2lb wrist weight used with weight strapped around extended fingers; 10X. Completed wrist extension using 1/2 wrist weight this time with hand in a fist with moderate difficulty demonstrated; 10X      Additional  Wrist Exercises   Hand Gripper with Large Beads All beads with hand gripper at 7#   horizontal although max difficulty due to difficulty faciliating wrist extension when hand is gripping an items or making a fist.   Hand Gripper with Medium Beads all beads with gripper at 7#   vertical     Fine Motor Coordination (Hand/Wrist)   Fine Motor Coordination Grooved pegs;Picking up coins;Manipulating coins;Stacking coins;Dealing card with thumb    Dealing card with thumb Focused on left thumb functional mobility while dealing cards with thumb. Therapist provided stability to hand to remain in a supinated position without using gravity to help cards fall and focusing on thumb completing task.    Picking up coins Pt able to pick up 5 coins one at a time transferring from finger tip to palm then palm to finger tip to make a stack of 5.    Grooved pegs Removed grooved pegs from pegboard while attempting to hold them all in palm of hand focusing on in hand manipulation. Only dropped 4 pegs.                  OT Education - 03/07/20 1545    Education Details Discussed her difficulty with  wrist extension when her hand is in a fist versus fingers extended. Verbal education provided to complete wrist extension strengthening by holding a lightweight item at home.    Person(s) Educated Patient    Methods Explanation;Demonstration    Comprehension Verbalized understanding;Returned demonstration            OT Short Term Goals - 02/22/20 1605      OT SHORT TERM GOAL #1   Title Patient will be educated and independent with HEP in order to increase functional use of both arms while completing basic self care tasks.    Time 3    Period Weeks    Target Date 12/19/19      OT SHORT TERM GOAL #2   Title Patient will increase her BUE P/ROM to [facility] in order to increase ability to complete upper body dressing tasks with less difficulty.    Time 3    Period Weeks      OT SHORT TERM GOAL #3   Title Patient  will increase bilateral hand grip strength by 5# and pinch strength by 2# in order to increase ability to open jars and containers that are not tight.    Time 3    Period Weeks      OT SHORT TERM GOAL #4   Title Patient will increase her left hand coordination by completing the 9 hole peg test in under 1 minute in order to participate in simple housekeeping tasks with less difficulty.    Time 3    Period Weeks             OT Long Term Goals - 02/22/20 1606      OT LONG TERM GOAL #1   Title Patient will return to using both arms for all daily tasks while using her LUE for 75% of more of daily tasks.    Time 8    Period Weeks    Status On-going      OT LONG TERM GOAL #2   Title Patient will report a decrease in pain in BUE during functional tasks of approximately 3/10 or less.    Time 8    Period Weeks      OT LONG TERM GOAL #3   Title Patient will increase BUE shoulder, wrist, and hand A/ROM to WNL or as close as possible in order to decrease difficulty level during dressing and functional reaching tasks.    Time 8    Period Weeks    Status On-going      OT LONG TERM GOAL #4   Title Patient will increase grip strength in the right hand to 45#, left hand to 10#, and left pinch strength to 5# in order to increase ability to manipulate and open jars and containers without needing assistance.    Baseline Update 12/1:Patient has met goal for grip and pinch. Goal updated to continue progress in the therapy. See goal #9.    Time 8    Period Weeks      OT LONG TERM GOAL #5   Title Patient will decrease the bilateral UE fascial restrictions to min amount or less in order to increase her functional mobility needed to complete desire housekeeping tasks.    Time 8    Period Weeks      OT LONG TERM GOAL #6   Title Patient will increase her left hand coordination by completing the 9 hole peg test in 45" or better to allow her to manipulate small items at  home such as buttons, zippers,  etc.    Baseline Update 12/1: Patient has met goal for coordination. Goal updated to continue progress. See goal #8    Time 8    Period Weeks      OT LONG TERM GOAL #7   Title Patient will improve BUE strength to at least a 4-/5 in order to promote patient's ability to complete daily self-care tasks with less difficulty.    Time 8    Period Weeks    Status On-going      OT LONG TERM GOAL #8   Title Patient will increase her left hand coordination by completing the 9 hole peg test in 30" or better to allow her to manipulate small items at home such as buttons, zippers, etc.    Baseline 12/1: 45"    Time 6    Period Weeks    Status On-going      OT LONG TERM GOAL  #9   TITLE Patient will increase grip strength in the left hand to 25#, and left pinch strength to 10# in order to increase ability to manipulate and open jars and containers without needing assistance.    Baseline 12/1: left grip strength: 15#. left pinch ( 3point and lateral): 6#    Time 6    Period Weeks    Status On-going                 Plan - 03/07/20 1546    Clinical Impression Statement A: Focused on hand strengthening, coordination, and wrist strengthening. Patient completed all activities with VC for form and technique. Required therapist to stabilize UE as needed to eliminate any compensatory movement. Demonstrated inability to perform wrist extension while holding 1# handweight. Difficulty decreases when fingers are extended with lower weight used versus when gripping the lightweight.    Body Structure / Function / Physical Skills ADL;Fascial restriction;UE functional use;Pain;FMC;ROM;GMC;Coordination;Decreased knowledge of precautions    Plan P: Complete activity focusing on wrist extension strengthening (hole punch with golf tee and fruit stand avalanche.    Consulted and Agree with Plan of Care Patient           Patient will benefit from skilled therapeutic intervention in order to improve the  following deficits and impairments:   Body Structure / Function / Physical Skills: ADL,Fascial restriction,UE functional use,Pain,FMC,ROM,GMC,Coordination,Decreased knowledge of precautions       Visit Diagnosis: Other symptoms and signs involving the musculoskeletal system  Other lack of coordination  Stiffness of right shoulder, not elsewhere classified  Stiffness of left shoulder, not elsewhere classified  Acute pain of left shoulder    Problem List Patient Active Problem List   Diagnosis Date Noted  . Primary hypertension 01/23/2020  . Need for immunization against influenza 01/23/2020  . Encounter to establish care 01/19/2020  . Acute blood loss anemia 09/04/2019  . Drainage from wound 09/04/2019  . 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 Neuropathy 09/04/2019  . Neurogenic bowel 09/04/2019  . Central cord syndrome ([place]) 08/07/2019  . Thoracic spine fracture [facility]) 08/03/2019   Ailene Ravel, OTR/L,CBIS  [phone]  03/07/2020, 3:49 PM  [facility] [address] [place], [place], [address] Phone: [phone]   Fax:  [phone]  Name: Shelly Houston MRN: [id number] Date of Birth: 1971-02-21

## 2020-03-12 ENCOUNTER — Ambulatory Visit (HOSPITAL_COMMUNITY): Payer: Medicaid Other

## 2020-03-12 ENCOUNTER — Encounter (HOSPITAL_COMMUNITY): Payer: Self-pay

## 2020-03-12 ENCOUNTER — Other Ambulatory Visit: Payer: Self-pay

## 2020-03-12 DIAGNOSIS — M25512 Pain in left shoulder: Secondary | ICD-10-CM

## 2020-03-12 DIAGNOSIS — R278 Other lack of coordination: Secondary | ICD-10-CM | POA: Diagnosis not present

## 2020-03-12 DIAGNOSIS — M25612 Stiffness of left shoulder, not elsewhere classified: Secondary | ICD-10-CM

## 2020-03-12 DIAGNOSIS — R29898 Other symptoms and signs involving the musculoskeletal system: Secondary | ICD-10-CM

## 2020-03-12 DIAGNOSIS — M25611 Stiffness of right shoulder, not elsewhere classified: Secondary | ICD-10-CM | POA: Diagnosis not present

## 2020-03-12 NOTE — Therapy (Signed)
[place], [place], [address] Phone: [phone]   Fax:  [phone]  Occupational Therapy Treatment  Patient Details  Name: Shelly Houston MRN: [id number] Date of Birth: 06-27-1970 Referring Provider (OT): -- (send all re-certs to Dr. Ihor Dow per Reesa Chew, PA-C)   Encounter Date: 03/12/2020   OT End of Session - 03/12/20 1637    Visit Number 17    Number of Visits 24    Date for OT Re-Evaluation 03/21/20    Authorization Type [employer]    Authorization Time Period 6 visits approved (02/12/20-03/22/20)    Authorization - Visit Number 5    Authorization - Number of Visits 6    OT Start Time 1435    OT Stop Time 1513    OT Time Calculation (min) 38 min    Activity Tolerance Patient tolerated treatment well    Behavior During Therapy [facility] for tasks assessed/performed           Past Medical History:  Diagnosis Date  . Central cord syndrome ([place])   . Primary hypertension 01/23/2020    Past Surgical History:  Procedure Laterality Date  . ABDOMINAL HYSTERECTOMY    . head surgery  2021    There were no vitals filed for this visit.   Subjective Assessment - 03/12/20 1450    Subjective  S: My wrist is sore when I was doing the wrist exercises with weight at home. It actually was swollen so I stopped.    Patient is accompanied by: Interpreter    Currently in Pain? Yes    Pain Score 8     Pain Location Wrist    Pain Orientation Left    Pain Descriptors / Indicators Sore    Pain Type Acute pain    Pain Onset In the past 7 days    Pain Frequency Constant    Aggravating Factors  Wrist strengthening    Pain Relieving Factors cold    Effect of Pain on Daily Activities Min-mod effect    Multiple Pain Sites No              [facility] OT Assessment - 03/12/20 1503      Assessment   Medical Diagnosis Central cord syndrome      Precautions   Precautions Cervical;Back                     OT Treatments/Exercises (OP) - 03/12/20 0001      Exercises   Exercises Hand;Wrist      Wrist Exercises   Other wrist exercises Wrist extension with forearm placed on table top; A/ROM 10X with fingers extended and again with hand in a closed fist.    Other wrist exercises P/ROM wrist flexion/extension, ulnar and radial deviation; 5X      Hand Exercises   Other Hand Exercises Hole punch completed using golf tee and foam piece underneith while focusing on wrist extension. Completed once with paper flat on table then moved to slant board for increased wrist and forearm support.      Manual Therapy   Manual Therapy Soft tissue mobilization    Manual therapy comments manual therapy completed seperately from all other interventions this date    Soft tissue mobilization Soft tissue mobilization and passive stretching completed to left wrist to increase joint mobility and decrease restricitons.      Fine Motor Coordination (Hand/Wrist)   Fine Motor Coordination Small Pegboard  Small Pegboard Utilizing tweezers, patient completed colored pegboard board pattern while placing all pegs in board. Unable to complete last portion of task due to hand fatigue.                    OT Short Term Goals - 02/22/20 1605      OT SHORT TERM GOAL #1   Title Patient will be educated and independent with HEP in order to increase functional use of both arms while completing basic self care tasks.    Time 3    Period Weeks    Target Date 12/19/19      OT SHORT TERM GOAL #2   Title Patient will increase her BUE P/ROM to [facility] in order to increase ability to complete upper body dressing tasks with less difficulty.    Time 3    Period Weeks      OT SHORT TERM GOAL #3   Title Patient will increase bilateral hand grip strength by 5# and pinch strength by 2# in order to increase ability to open jars and containers that are not tight.    Time 3    Period Weeks      OT SHORT TERM  GOAL #4   Title Patient will increase her left hand coordination by completing the 9 hole peg test in under 1 minute in order to participate in simple housekeeping tasks with less difficulty.    Time 3    Period Weeks             OT Long Term Goals - 02/22/20 1606      OT LONG TERM GOAL #1   Title Patient will return to using both arms for all daily tasks while using her LUE for 75% of more of daily tasks.    Time 8    Period Weeks    Status On-going      OT LONG TERM GOAL #2   Title Patient will report a decrease in pain in BUE during functional tasks of approximately 3/10 or less.    Time 8    Period Weeks      OT LONG TERM GOAL #3   Title Patient will increase BUE shoulder, wrist, and hand A/ROM to WNL or as close as possible in order to decrease difficulty level during dressing and functional reaching tasks.    Time 8    Period Weeks    Status On-going      OT LONG TERM GOAL #4   Title Patient will increase grip strength in the right hand to 45#, left hand to 10#, and left pinch strength to 5# in order to increase ability to manipulate and open jars and containers without needing assistance.    Baseline Update 12/1:Patient has met goal for grip and pinch. Goal updated to continue progress in the therapy. See goal #9.    Time 8    Period Weeks      OT LONG TERM GOAL #5   Title Patient will decrease the bilateral UE fascial restrictions to min amount or less in order to increase her functional mobility needed to complete desire housekeeping tasks.    Time 8    Period Weeks      OT LONG TERM GOAL #6   Title Patient will increase her left hand coordination by completing the 9 hole peg test in 45" or better to allow her to manipulate small items at home such as buttons, zippers, etc.    Baseline  Update 12/1: Patient has met goal for coordination. Goal updated to continue progress. See goal #8    Time 8    Period Weeks      OT LONG TERM GOAL #7   Title Patient will  improve BUE strength to at least a 4-/5 in order to promote patient's ability to complete daily self-care tasks with less difficulty.    Time 8    Period Weeks    Status On-going      OT LONG TERM GOAL #8   Title Patient will increase her left hand coordination by completing the 9 hole peg test in 30" or better to allow her to manipulate small items at home such as buttons, zippers, etc.    Baseline 12/1: 45"    Time 6    Period Weeks    Status On-going      OT LONG TERM GOAL  #9   TITLE Patient will increase grip strength in the left hand to 25#, and left pinch strength to 10# in order to increase ability to manipulate and open jars and containers without needing assistance.    Baseline 12/1: left grip strength: 15#. left pinch ( 3point and lateral): 6#    Time 6    Period Weeks    Status On-going                 Plan - 03/12/20 1638    Clinical Impression Statement A: Focused on activating and strengthening wrist extension during session with use of hole punch worksheet flat on floor then also on slant board. Unable to complete entire pattern of colored pegboard due to wrist and hand fatigue. VC were provided during session for form and technique.    Body Structure / Function / Physical Skills ADL;Fascial restriction;UE functional use;Pain;FMC;ROM;GMC;Coordination;Decreased knowledge of precautions    Plan P: request more visits from [employer]. Next visit is visit 6 out of 6. Reassessment if needed for request. Complete colored pegboard with tweezers first due to hand fatigue noted at last session.           Patient will benefit from skilled therapeutic intervention in order to improve the following deficits and impairments:   Body Structure / Function / Physical Skills: ADL,Fascial restriction,UE functional use,Pain,FMC,ROM,GMC,Coordination,Decreased knowledge of precautions       Visit Diagnosis: Other lack of coordination  Other symptoms and signs involving the  musculoskeletal system  Stiffness of left shoulder, not elsewhere classified  Acute pain of left shoulder    Problem List Patient Active Problem List   Diagnosis Date Noted  . Primary hypertension 01/23/2020  . Need for immunization against influenza 01/23/2020  . Encounter to establish care 01/19/2020  . Acute blood loss anemia 09/04/2019  . Drainage from wound 09/04/2019  . 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 Neuropathy 09/04/2019  . Neurogenic bowel 09/04/2019  . Central cord syndrome ([place]) 08/07/2019  . Thoracic spine fracture [facility]) 08/03/2019   Ailene Ravel, OTR/L,CBIS  [phone]  03/12/2020, 6:15 PM  [place] [address] [place], [place], [address] Phone: [phone]   Fax:  [phone]  Name: Shelly Houston MRN: [id number] Date of Birth: [birth year]

## 2020-03-14 ENCOUNTER — Ambulatory Visit (HOSPITAL_COMMUNITY): Payer: Medicaid Other

## 2020-03-14 ENCOUNTER — Other Ambulatory Visit: Payer: Self-pay

## 2020-03-14 ENCOUNTER — Encounter (HOSPITAL_COMMUNITY): Payer: Self-pay

## 2020-03-14 DIAGNOSIS — M25612 Stiffness of left shoulder, not elsewhere classified: Secondary | ICD-10-CM

## 2020-03-14 DIAGNOSIS — M25611 Stiffness of right shoulder, not elsewhere classified: Secondary | ICD-10-CM

## 2020-03-14 DIAGNOSIS — R29898 Other symptoms and signs involving the musculoskeletal system: Secondary | ICD-10-CM

## 2020-03-14 DIAGNOSIS — R278 Other lack of coordination: Secondary | ICD-10-CM | POA: Diagnosis not present

## 2020-03-14 DIAGNOSIS — M25512 Pain in left shoulder: Secondary | ICD-10-CM | POA: Diagnosis not present

## 2020-03-14 NOTE — Therapy (Signed)
[place], [place], [address] Phone: [phone]   Fax:  [phone]  Occupational Therapy Treatment Reassessment/re-cert Patient Details  Name: Shelly Houston MRN: [id number] Date of Birth: 1970/06/16 Referring Provider (OT): -- (Sending all re-certs and progress notes to Dr. Ihor Dow - PCP)   Encounter Date: 03/14/2020   OT End of Session - 03/14/20 1517    Visit Number 18    Number of Visits 30    Date for OT Re-Evaluation 04/25/20    Authorization Type [employer]    Authorization Time Period 6 visits approved (02/12/20-03/22/20)    Authorization - Visit Number 6    Authorization - Number of Visits 6    OT Start Time 1430   reassessment   OT Stop Time 1520    OT Time Calculation (min) 50 min    Activity Tolerance Patient tolerated treatment well    Behavior During Therapy [facility] for tasks assessed/performed           Past Medical History:  Diagnosis Date  . Central cord syndrome ([place])   . Primary hypertension 01/23/2020    Past Surgical History:  Procedure Laterality Date  . ABDOMINAL HYSTERECTOMY    . head surgery  2021    There were no vitals filed for this visit.   Subjective Assessment - 03/14/20 1436    Subjective  S: I have been working on the entire arm at home. I used a 3lb weight with my wrist at home and it was too heavy, it made it sore.    Patient is accompanied by: Interpreter    Currently in Pain? No/denies              [facility] OT Assessment - 03/14/20 1437      Assessment   Medical Diagnosis Central cord syndrome    Referring Provider (OT) --   Sending all re-certs and progress notes to Dr. Ihor Dow - PCP   Onset Date/Surgical Date 08/03/19      Precautions   Precautions Cervical;Back      Prior Function   Level of Independence Independent      Coordination   9 Hole Peg Test Left    Left 9 Hole Peg Test 31.7"   previous: 42.4"     AROM   Right Shoulder  Flexion 140 Degrees   previous: 137   Right Shoulder ABduction 138 Degrees   previous: 140   Left Shoulder Flexion 82 Degrees   previous: 102   Left Shoulder ABduction 90 Degrees   previous: same   Left Shoulder Internal Rotation 90 Degrees   previous: same   Left Shoulder External Rotation 66 Degrees   previous: 66   Left Wrist Extension 68 Degrees   previous: 70   Left Wrist Flexion 76 Degrees   previous: 78     Strength   Overall Strength Comments Assessed seated. IR/er adducted    Strength Assessment Site Hand;Shoulder;Elbow;Forearm;Wrist    Right/Left Shoulder Left;Right    Right Shoulder Flexion 4/5   previous: 3+/5   Right Shoulder ABduction 4/5   previous: 3+/5   Right Shoulder Internal Rotation 4+/5   previous: 4-/5   Right Shoulder External Rotation 5/5   previous: 4-/5   Left Shoulder Flexion 4/5   previous: 3/5   Left Shoulder ABduction 4/5   previous: 3/5   Left Shoulder Internal Rotation 4/5   previous: 3+/5   Left Shoulder External Rotation 5/5   previous:  3+/5   Right Forearm Pronation 5/5   previous: 4-/5   Right Forearm Supination 5/5   previous: 4-/5   Left Forearm Pronation 5/5    Left Forearm Supination 4+/5    Right Wrist Flexion 5/5   previous: 4-/5   Right Wrist Extension 5/5   previous: 4-/5   Right Wrist Radial Deviation 5/5   previous: 4-/5   Right Wrist Ulnar Deviation 5/5   previous: 4-/5   Left Wrist Flexion 5/5   previous: 3/5   Left Wrist Extension 3+/5   previous: 3/5   Left Wrist Radial Deviation 3+/5   previous: 3/5   Left Wrist Ulnar Deviation 4-/5   previous 3/5   Right/Left hand Left    Left Hand Grip (lbs) 20   previous: 15   Left Hand Lateral Pinch 6 lbs   previous: same   Left Hand 3 Point Pinch 7 lbs   previous: 6                           OT Education - 03/14/20 1539    Education Details Reviewed therapy goals, progress in therapy, reasons that ROM measurements and/or strength may fluctuate. Discussed frequency of  completing HEP.    Person(s) Educated Patient    Methods Explanation    Comprehension Verbalized understanding            OT Short Term Goals - 02/22/20 1605      OT SHORT TERM GOAL #1   Title Patient will be educated and independent with HEP in order to increase functional use of both arms while completing basic self care tasks.    Time 3    Period Weeks    Target Date 12/19/19      OT SHORT TERM GOAL #2   Title Patient will increase her BUE P/ROM to [facility] in order to increase ability to complete upper body dressing tasks with less difficulty.    Time 3    Period Weeks      OT SHORT TERM GOAL #3   Title Patient will increase bilateral hand grip strength by 5# and pinch strength by 2# in order to increase ability to open jars and containers that are not tight.    Time 3    Period Weeks      OT SHORT TERM GOAL #4   Title Patient will increase her left hand coordination by completing the 9 hole peg test in under 1 minute in order to participate in simple housekeeping tasks with less difficulty.    Time 3    Period Weeks             OT Long Term Goals - 03/14/20 1447      OT LONG TERM GOAL #1   Title Patient will return to using both arms for all daily tasks while using her LUE for 75% of more of daily tasks.    Baseline 1/13: Pt reports using LUE for 50%    Time 8    Period Weeks    Status On-going      OT LONG TERM GOAL #2   Title Patient will report a decrease in pain in BUE during functional tasks of approximately 3/10 or less.    Time 8    Period Weeks      OT LONG TERM GOAL #3   Title Patient will increase BUE shoulder, wrist, and hand A/ROM to WNL or as close as  possible in order to decrease difficulty level during dressing and functional reaching tasks.    Time 8    Period Weeks    Status On-going      OT LONG TERM GOAL #4   Title Patient will increase grip strength in the right hand to 45#, left hand to 10#, and left pinch strength to 5# in order to  increase ability to manipulate and open jars and containers without needing assistance.    Baseline Update 12/1:Patient has met goal for grip and pinch. Goal updated to continue progress in the therapy. See goal #9.    Time 8    Period Weeks      OT LONG TERM GOAL #5   Title Patient will decrease the bilateral UE fascial restrictions to min amount or less in order to increase her functional mobility needed to complete desire housekeeping tasks.    Time 8    Period Weeks      OT LONG TERM GOAL #6   Title Patient will increase her left hand coordination by completing the 9 hole peg test in 45" or better to allow her to manipulate small items at home such as buttons, zippers, etc.    Baseline Update 12/1: Patient has met goal for coordination. Goal updated to continue progress. See goal #8    Time 8    Period Weeks      OT LONG TERM GOAL #7   Title Patient will improve BUE strength to at least a 4-/5 in order to promote patient's ability to complete daily self-care tasks with less difficulty.    Time 8    Period Weeks    Status Achieved      OT LONG TERM GOAL #8   Title Patient will increase her left hand coordination by completing the 9 hole peg test in 30" or better to allow her to manipulate small items at home such as buttons, zippers, etc.    Baseline 1/13: 31.7"    Time 6    Period Weeks    Status On-going      OT LONG TERM GOAL  #9   TITLE Patient will increase grip strength in the left hand to 25#, and left pinch strength to 10# in order to increase ability to manipulate and open jars and containers without needing assistance.    Baseline 1/13: left grip strength: 20#. left pinch lateral: 6# 3 point: 7#    Time 6    Period Weeks    Status On-going                 Plan - 03/14/20 1553    Clinical Impression Statement A: Reassessment completed this date as authorized insurance visits have been used. Overall, patient has met all short term goals and 5/9 LTGs. One new  long term goal has been met related to LUE strength (shoulder, elbow, wrist). Pt is not demonstrates a 4-/5 or better strength score in all joints with the exception of wrist extension and radial deviation which are demonstrating lower. Pinch strength has not increased for lateral pinch and 3 point pinch as increased by 1#. Grip is progressing as patient demonstrates 5# less than goal at this point. LUE shoulder A/ROM measurements appear to have fluctuated although not significantly. This could be attributed to weather temperature or focusing the past 6 visits strictly on hand and wrist use. Due to continued progress in therapy, continuing therapy services is recommended to focus on strength, ROM, coordination, hand strength  of the LUE primarily.    Body Structure / Function / Physical Skills ADL;Fascial restriction;UE functional use;Pain;FMC;ROM;GMC;Coordination;Decreased knowledge of precautions    OT Frequency 2x / week    OT Duration 6 weeks    Plan P: Continue skilled OT services 2x a week for 6 weeks focusing on mentioned deficits in order to continue to increase functional use of primarily her left UE for daily tasks. Next session: complete DASH. Complete colored pegboard with tweezers first due to hand fatigue. Focus on pinch strength. Functional reaching task for left shoulder.    Consulted and Agree with Plan of Care Patient           Patient will benefit from skilled therapeutic intervention in order to improve the following deficits and impairments:   Body Structure / Function / Physical Skills: ADL,Fascial restriction,UE functional use,Pain,FMC,ROM,GMC,Coordination,Decreased knowledge of precautions       Visit Diagnosis: Other lack of coordination - Plan: Ot plan of care cert/re-cert  Other symptoms and signs involving the musculoskeletal system - Plan: Ot plan of care cert/re-cert  Stiffness of left shoulder, not elsewhere classified - Plan: Ot plan of care cert/re-cert  Acute  pain of left shoulder - Plan: Ot plan of care cert/re-cert  Stiffness of right shoulder, not elsewhere classified - Plan: Ot plan of care cert/re-cert    Problem List Patient Active Problem List   Diagnosis Date Noted  . Primary hypertension 01/23/2020  . Need for immunization against influenza 01/23/2020  . Encounter to establish care 01/19/2020  . Acute blood loss anemia 09/04/2019  . Drainage from wound 09/04/2019  . 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 Neuropathy 09/04/2019  . Neurogenic bowel 09/04/2019  . Central cord syndrome ([place]) 08/07/2019  . Thoracic spine fracture [facility]) 08/03/2019   Ailene Ravel, OTR/L,CBIS  [phone]  03/14/2020, 4:04 PM  [facility] [address] [place], [place], [address] Phone: [phone]   Fax:  [phone]  Name: Shelly Houston MRN: [id number] Date of Birth: 09-26-1970

## 2020-03-19 ENCOUNTER — Encounter (HOSPITAL_COMMUNITY): Payer: Self-pay

## 2020-03-19 ENCOUNTER — Other Ambulatory Visit: Payer: Self-pay

## 2020-03-19 ENCOUNTER — Ambulatory Visit (HOSPITAL_COMMUNITY): Payer: Medicaid Other

## 2020-03-19 DIAGNOSIS — R278 Other lack of coordination: Secondary | ICD-10-CM

## 2020-03-19 DIAGNOSIS — M25512 Pain in left shoulder: Secondary | ICD-10-CM

## 2020-03-19 DIAGNOSIS — R29898 Other symptoms and signs involving the musculoskeletal system: Secondary | ICD-10-CM

## 2020-03-19 DIAGNOSIS — M25612 Stiffness of left shoulder, not elsewhere classified: Secondary | ICD-10-CM

## 2020-03-19 DIAGNOSIS — M25611 Stiffness of right shoulder, not elsewhere classified: Secondary | ICD-10-CM | POA: Diagnosis not present

## 2020-03-19 NOTE — Therapy (Addendum)
[place], [place], [address] Phone: [phone]   Fax:  [phone]  Occupational Therapy Treatment  Patient Details  Name: Shelly Houston MRN: [id number] Date of Birth: [birth year] Referring Provider (OT): -- (Sending all re-certs and progress notes to Dr. Ihor Dow - PCP)   Encounter Date: 03/19/2020   OT End of Session - 03/19/20 1534    Visit Number 19    Number of Visits 30    Date for OT Re-Evaluation 04/25/20    Authorization Type [employer]    Authorization Time Period 12 visits approved 02/12/20-07/27/20   Authorization - Visit Number 1   Authorization - Number of Visits  12   OT Start Time 9357    OT Stop Time 1510    OT Time Calculation (min) 38 min    Activity Tolerance Patient tolerated treatment well    Behavior During Therapy [facility] for tasks assessed/performed           Past Medical History:  Diagnosis Date  . Central cord syndrome ([place])   . Primary hypertension 01/23/2020    Past Surgical History:  Procedure Laterality Date  . ABDOMINAL HYSTERECTOMY    . head surgery  2021    There were no vitals filed for this visit.   Subjective Assessment - 03/19/20 1444    Subjective  S: I'm starting to get pain in my fingertips when I'm using my hand more.    Patient is accompanied by: Interpreter    Currently in Pain? Yes    Pain Score 5     Pain Location Hand    Pain Orientation Left    Pain Descriptors / Indicators Tingling    Pain Type Acute pain    Pain Onset 1 to 4 weeks ago    Pain Frequency Occasional    Aggravating Factors  Increased use of left hand    Pain Relieving Factors rest/stopping activity    Effect of Pain on Daily Activities min-mod effect    Multiple Pain Sites No              [facility] OT Assessment - 03/19/20 1447      Assessment   Medical Diagnosis Central cord syndrome      Precautions   Precautions Cervical;Back      Observation/Other Assessments    Quick DASH  31.82   previous: same             Katina Dung - 03/19/20 1436    Open a tight or new jar Moderate difficulty    Do heavy household chores (wash walls, wash floors) Moderate difficulty    Carry a shopping bag or briefcase No difficulty    Wash your back Moderate difficulty    Use a knife to cut food Moderate difficulty    Recreational activities in which you take some force or impact through your arm, shoulder, or hand (golf, hammering, tennis) Moderate difficulty    During the past week, to what extent has your arm, shoulder or hand problem interfered with your normal social activities with family, friends, neighbors, or groups? Not at all    During the past week, to what extent has your arm, shoulder or hand problem limited your work or other regular daily activities Not at all    Arm, shoulder, or hand pain. Moderate    Tingling (pins and needles) in your arm, shoulder, or hand Moderate    Difficulty Sleeping No difficulty  DASH Score 31.82 %                OT Treatments/Exercises (OP) - 03/19/20 1447      Exercises   Exercises Hand;Wrist;Shoulder      Shoulder Exercises: Therapy Diona Foley   Other Therapy [facility] Exercises Basketball strengthening; standing using mirror as visual feedback. Flexion, chest press 10X each      Shoulder Exercises: ROM/Strengthening   UBE (Upper Arm Bike) Level 1 2' forward 2' reverse   pace: 5.0-6.0   Over Head Lace 2' seated      Functional Reaching Activities   High Level High reaching completed with left arm while placing [place] on door at highest level without leaning or compensatory movement. Removed in same fashion.      Fine Motor Coordination (Hand/Wrist)   Fine Motor Coordination Small Pegboard    Small Pegboard Utilizing tweezers, patient completed colored pegboard board pattern while placing all pegs in board.                    OT Short Term Goals - 03/19/20 1448      OT SHORT TERM GOAL #1   Title  Patient will be educated and independent with HEP in order to increase functional use of both arms while completing basic self care tasks.    Time 3    Period Weeks    Target Date 12/19/19      OT SHORT TERM GOAL #2   Title Patient will increase her BUE P/ROM to [facility] in order to increase ability to complete upper body dressing tasks with less difficulty.    Time 3    Period Weeks      OT SHORT TERM GOAL #3   Title Patient will increase bilateral hand grip strength by 5# and pinch strength by 2# in order to increase ability to open jars and containers that are not tight.    Time 3    Period Weeks      OT SHORT TERM GOAL #4   Title Patient will increase her left hand coordination by completing the 9 hole peg test in under 1 minute in order to participate in simple housekeeping tasks with less difficulty.    Time 3    Period Weeks             OT Long Term Goals - 03/19/20 1448      OT LONG TERM GOAL #1   Title Patient will return to using both arms for all daily tasks while using her LUE for 75% of more of daily tasks.    Baseline 1/13: Pt reports using LUE for 50%    Time 8    Period Weeks    Status On-going      OT LONG TERM GOAL #2   Title Patient will report a decrease in pain in BUE during functional tasks of approximately 3/10 or less.    Time 8    Period Weeks      OT LONG TERM GOAL #3   Title Patient will increase BUE shoulder, wrist, and hand A/ROM to WNL or as close as possible in order to decrease difficulty level during dressing and functional reaching tasks.    Time 8    Period Weeks    Status On-going      OT LONG TERM GOAL #4   Title Patient will increase grip strength in the right hand to 45#, left hand to 10#, and left pinch strength to  5# in order to increase ability to manipulate and open jars and containers without needing assistance.    Baseline Update 12/1:Patient has met goal for grip and pinch. Goal updated to continue progress in the therapy. See  goal #9.    Time 8    Period Weeks      OT LONG TERM GOAL #5   Title Patient will decrease the bilateral UE fascial restrictions to min amount or less in order to increase her functional mobility needed to complete desire housekeeping tasks.    Time 8    Period Weeks      OT LONG TERM GOAL #6   Title Patient will increase her left hand coordination by completing the 9 hole peg test in 45" or better to allow her to manipulate small items at home such as buttons, zippers, etc.    Baseline Update 12/1: Patient has met goal for coordination. Goal updated to continue progress. See goal #8    Time 8    Period Weeks      OT LONG TERM GOAL #7   Title Patient will improve BUE strength to at least a 4-/5 in order to promote patient's ability to complete daily self-care tasks with less difficulty.    Time 8    Period Weeks      OT LONG TERM GOAL #8   Title Patient will increase her left hand coordination by completing the 9 hole peg test in 30" or better to allow her to manipulate small items at home such as buttons, zippers, etc.    Baseline 1/13: 31.7"    Time 6    Period Weeks    Status On-going      OT LONG TERM GOAL  #9   TITLE Patient will increase grip strength in the left hand to 25#, and left pinch strength to 10# in order to increase ability to manipulate and open jars and containers without needing assistance.    Baseline 1/13: left grip strength: 20#. left pinch lateral: 6# 3 point: 7#    Time 6    Period Weeks    Status On-going                 Plan - 03/19/20 1538    Clinical Impression Statement A: focused on coordination with pinch strengthening while using tweezers and pegboard at start of session as patient attempted to complete this activity at a previous date and could not due to hand fatigue. Incorporated shoulder strengthening and stability with functional reaching tasks. patient demonstrates limited left shoulder A/ROM flexion which causes raising her up above  shoulder level to be very difficult and attempts to use compensatory movements to improve reach. VC were provided for form and technique during session. patient was able to finish pegboard task with only one rest break for her neck from using a flexed position.    Body Structure / Function / Physical Skills ADL;Fascial restriction;UE functional use;Pain;FMC;ROM;GMC;Coordination;Decreased knowledge of precautions    Plan P: Finger ladder to increase functional reach/shoulder flexion. May trial flexion glove to increase digit flexion of the left hand.    Consulted and Agree with Plan of Care Patient           Patient will benefit from skilled therapeutic intervention in order to improve the following deficits and impairments:   Body Structure / Function / Physical Skills: ADL,Fascial restriction,UE functional use,Pain,FMC,ROM,GMC,Coordination,Decreased knowledge of precautions       Visit Diagnosis: Other lack of coordination  Other  symptoms and signs involving the musculoskeletal system  Stiffness of left shoulder, not elsewhere classified  Acute pain of left shoulder    Problem List Patient Active Problem List   Diagnosis Date Noted  . Primary hypertension 01/23/2020  . Need for immunization against influenza 01/23/2020  . Encounter to establish care 01/19/2020  . Acute blood loss anemia 09/04/2019  . Drainage from wound 09/04/2019  . 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 Neuropathy 09/04/2019  . Neurogenic bowel 09/04/2019  . Central cord syndrome ([place]) 08/07/2019  . Thoracic spine fracture [facility]) 08/03/2019   Ailene Ravel, OTR/L,CBIS  [phone]  03/19/2020, 3:45 PM  [place] [address] [place], [place], [address] Phone: [phone]   Fax:  [phone]  Name: Shelly Houston MRN: [id number] Date of Birth: [birth year]

## 2020-03-21 ENCOUNTER — Ambulatory Visit (HOSPITAL_COMMUNITY): Payer: Medicaid Other

## 2020-03-26 ENCOUNTER — Other Ambulatory Visit: Payer: Self-pay

## 2020-03-26 ENCOUNTER — Ambulatory Visit (HOSPITAL_COMMUNITY): Payer: Medicaid Other

## 2020-03-26 DIAGNOSIS — R29898 Other symptoms and signs involving the musculoskeletal system: Secondary | ICD-10-CM

## 2020-03-26 DIAGNOSIS — M25512 Pain in left shoulder: Secondary | ICD-10-CM | POA: Diagnosis not present

## 2020-03-26 DIAGNOSIS — M25612 Stiffness of left shoulder, not elsewhere classified: Secondary | ICD-10-CM

## 2020-03-26 DIAGNOSIS — R278 Other lack of coordination: Secondary | ICD-10-CM

## 2020-03-26 DIAGNOSIS — M25611 Stiffness of right shoulder, not elsewhere classified: Secondary | ICD-10-CM | POA: Diagnosis not present

## 2020-03-26 NOTE — Therapy (Signed)
[place], [place], [address] Phone: [phone]   Fax:  [phone]  Occupational Therapy Treatment  Patient Details  Name: Shelly Houston MRN: [id number] Date of Birth: 08/14/1970 Referring Provider (OT): -- (Sending all re-certs and progress notes to Dr. Ihor Dow - PCP)   Encounter Date: 03/26/2020   OT End of Session - 03/26/20 1454    Visit Number 20    Number of Visits 30    Date for OT Re-Evaluation 04/25/20    Authorization Type [employer]    Authorization Time Period 12 visist approved (02/12/20-07/27/20)    Authorization - Visit Number 2    Authorization - Number of Visits 12    OT Start Time 5093    OT Stop Time 1426    OT Time Calculation (min) 41 min    Activity Tolerance Patient tolerated treatment well    Behavior During Therapy [facility] for tasks assessed/performed           Past Medical History:  Diagnosis Date  . Central cord syndrome ([place])   . Primary hypertension 01/23/2020    Past Surgical History:  Procedure Laterality Date  . ABDOMINAL HYSTERECTOMY    . head surgery  2021    There were no vitals filed for this visit.   Subjective Assessment - 03/26/20 1439    Subjective  S: Nothing new to report.    Patient is accompanied by: Interpreter    Currently in Pain? No/denies              [place] OT Assessment - 03/26/20 1439      Assessment   Medical Diagnosis Central cord syndrome      Precautions   Precautions Cervical;Back                    OT Treatments/Exercises (OP) - 03/26/20 1348      Exercises   Exercises Hand;Wrist;Shoulder      Shoulder Exercises: ROM/Strengthening   UBE (Upper Arm Bike) Level 2 2' forward 2' reverse   pace: 7.5-8.5   Over Head Lace seated. Laced from top to bottom then removed from chain.    Other ROM/Strengthening Exercises Finger ladder completed standing using left UE. Able to reach #10. Completed 5 times up/down       Neurological Re-education Exercises   Other Grasp and Release Exercises  Standing using yellow clothespin and high resistance cubes. Used a 3 point pinch in left hand to pick cobes up from above and create a stack of 4 cubes. Focused on LUE shoulder stability and endurance, hand strength, and wrist strength.      Functional Reaching Activities   High Level Resistive clothespins utilized standing while focusing on functional reach and hand strengthening. Pins placed on vertical pole. Pins then removed. Able to place all yellow, red, and half of green                    OT Short Term Goals - 03/19/20 1448      OT SHORT TERM GOAL #1   Title Patient will be educated and independent with HEP in order to increase functional use of both arms while completing basic self care tasks.    Time 3    Period Weeks    Target Date 12/19/19      OT SHORT TERM GOAL #2   Title Patient will increase her BUE P/ROM to [facility] in order to increase ability to  complete upper body dressing tasks with less difficulty.    Time 3    Period Weeks      OT SHORT TERM GOAL #3   Title Patient will increase bilateral hand grip strength by 5# and pinch strength by 2# in order to increase ability to open jars and containers that are not tight.    Time 3    Period Weeks      OT SHORT TERM GOAL #4   Title Patient will increase her left hand coordination by completing the 9 hole peg test in under 1 minute in order to participate in simple housekeeping tasks with less difficulty.    Time 3    Period Weeks             OT Long Term Goals - 03/19/20 1448      OT LONG TERM GOAL #1   Title Patient will return to using both arms for all daily tasks while using her LUE for 75% of more of daily tasks.    Baseline 1/13: Pt reports using LUE for 50%    Time 8    Period Weeks    Status On-going      OT LONG TERM GOAL #2   Title Patient will report a decrease in pain in BUE during functional tasks of approximately  3/10 or less.    Time 8    Period Weeks      OT LONG TERM GOAL #3   Title Patient will increase BUE shoulder, wrist, and hand A/ROM to WNL or as close as possible in order to decrease difficulty level during dressing and functional reaching tasks.    Time 8    Period Weeks    Status On-going      OT LONG TERM GOAL #4   Title Patient will increase grip strength in the right hand to 45#, left hand to 10#, and left pinch strength to 5# in order to increase ability to manipulate and open jars and containers without needing assistance.    Baseline Update 12/1:Patient has met goal for grip and pinch. Goal updated to continue progress in the therapy. See goal #9.    Time 8    Period Weeks      OT LONG TERM GOAL #5   Title Patient will decrease the bilateral UE fascial restrictions to min amount or less in order to increase her functional mobility needed to complete desire housekeeping tasks.    Time 8    Period Weeks      OT LONG TERM GOAL #6   Title Patient will increase her left hand coordination by completing the 9 hole peg test in 45" or better to allow her to manipulate small items at home such as buttons, zippers, etc.    Baseline Update 12/1: Patient has met goal for coordination. Goal updated to continue progress. See goal #8    Time 8    Period Weeks      OT LONG TERM GOAL #7   Title Patient will improve BUE strength to at least a 4-/5 in order to promote patient's ability to complete daily self-care tasks with less difficulty.    Time 8    Period Weeks      OT LONG TERM GOAL #8   Title Patient will increase her left hand coordination by completing the 9 hole peg test in 30" or better to allow her to manipulate small items at home such as buttons, zippers, etc.  Baseline 1/13: 31.7"    Time 6    Period Weeks    Status On-going      OT LONG TERM GOAL  #9   TITLE Patient will increase grip strength in the left hand to 25#, and left pinch strength to 10# in order to increase  ability to manipulate and open jars and containers without needing assistance.    Baseline 1/13: left grip strength: 20#. left pinch lateral: 6# 3 point: 7#    Time 6    Period Weeks    Status On-going                 Plan - 03/26/20 1455    Clinical Impression Statement A: Focused on functional reaching with LUE while incorporating hand strength and wrist strength/stability. Continued to focus on decreasing compensatory movement to compensate for lack jont mobility and strength. VC for form and technique were provided. Patient was able to demonstrate carry over of cueing and education regarding form although required reminders intermittently.    Body Structure / Function / Physical Skills ADL;Fascial restriction;UE functional use;Pain;FMC;ROM;GMC;Coordination;Decreased knowledge of precautions    Plan P: Continue with functional reaching activities of left shoulder. May trial flexion glove to increase digit flexion of left hand.    Consulted and Agree with Plan of Care Patient           Patient will benefit from skilled therapeutic intervention in order to improve the following deficits and impairments:   Body Structure / Function / Physical Skills: ADL,Fascial restriction,UE functional use,Pain,FMC,ROM,GMC,Coordination,Decreased knowledge of precautions       Visit Diagnosis: Other symptoms and signs involving the musculoskeletal system  Other lack of coordination  Stiffness of left shoulder, not elsewhere classified  Acute pain of left shoulder    Problem List Patient Active Problem List   Diagnosis Date Noted  . Primary hypertension 01/23/2020  . Need for immunization against influenza 01/23/2020  . Encounter to establish care 01/19/2020  . Acute blood loss anemia 09/04/2019  . Drainage from wound 09/04/2019  . 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 Neuropathy 09/04/2019  . Neurogenic bowel 09/04/2019  . Central cord syndrome ([place]) 08/07/2019  .  Thoracic spine fracture [facility]) 08/03/2019   Ailene Ravel, OTR/L,CBIS  [phone]  03/26/2020, 3:19 PM  [place] [address] [place], [place], [address] Phone: [phone]   Fax:  [phone]  Name: Shelly Houston MRN: [id number] Date of Birth: 01/25/1971

## 2020-03-29 ENCOUNTER — Encounter (HOSPITAL_COMMUNITY): Payer: Self-pay

## 2020-03-29 ENCOUNTER — Other Ambulatory Visit: Payer: Self-pay

## 2020-03-29 ENCOUNTER — Ambulatory Visit (HOSPITAL_COMMUNITY): Payer: Medicaid Other

## 2020-03-29 DIAGNOSIS — M25512 Pain in left shoulder: Secondary | ICD-10-CM | POA: Diagnosis not present

## 2020-03-29 DIAGNOSIS — R278 Other lack of coordination: Secondary | ICD-10-CM

## 2020-03-29 DIAGNOSIS — M25612 Stiffness of left shoulder, not elsewhere classified: Secondary | ICD-10-CM

## 2020-03-29 DIAGNOSIS — M25611 Stiffness of right shoulder, not elsewhere classified: Secondary | ICD-10-CM | POA: Diagnosis not present

## 2020-03-29 DIAGNOSIS — R29898 Other symptoms and signs involving the musculoskeletal system: Secondary | ICD-10-CM | POA: Diagnosis not present

## 2020-03-29 NOTE — Therapy (Signed)
[place], [place], [address] Phone: [phone]   Fax:  [phone]  Occupational Therapy Treatment  Patient Details  Name: Shelly Houston MRN: [id number] Date of Birth: 12/19/1970 Referring Provider (OT): -- (Sending all re-certs and progress notes to Dr. Ihor Dow - PCP)   Encounter Date: 03/29/2020   OT End of Session - 03/29/20 1231    Visit Number 21    Number of Visits 30    Date for OT Re-Evaluation 04/25/20    Authorization Type [employer]    Authorization Time Period 12 visist approved (02/12/20-07/27/20)    Authorization - Visit Number 3    Authorization - Number of Visits 12    OT Start Time 1115    OT Stop Time 1157    OT Time Calculation (min) 42 min    Activity Tolerance Patient tolerated treatment well    Behavior During Therapy [facility] for tasks assessed/performed           Past Medical History:  Diagnosis Date  . Central cord syndrome ([place])   . Primary hypertension 01/23/2020    Past Surgical History:  Procedure Laterality Date  . ABDOMINAL HYSTERECTOMY    . head surgery  2021    There were no vitals filed for this visit.   Subjective Assessment - 03/29/20 1122    Subjective  S: Do you have any exercises for my neck?    Patient is accompanied by: Interpreter    Currently in Pain? No/denies              [facility] OT Assessment - 03/29/20 0001      Assessment   Medical Diagnosis Central cord syndrome      Precautions   Precautions Cervical;Back                    OT Treatments/Exercises (OP) - 03/29/20 1122      Exercises   Exercises Hand;Wrist;Shoulder      Shoulder Exercises: ROM/Strengthening   UBE (Upper Arm Bike) Level 2 2' forward 2' reverse   pace: 6.5     Wrist Exercises   Wrist Extension AROM;Left;15 reps;Seated   using half blue wedge on table     Additional Wrist Exercises   Hand Gripper with Large Beads All beads with hand gripper at 7#    required mod-max VC for technique with occassional stabilization at end of gripper due to decreased wrist strength. Held horizontally.   Hand Gripper with Medium Beads all beads with gripper at 7#   required mod-max VC for technique with occassional stabilization at end of gripper due to decreased wrist strength. Held horizontally.   Hand Gripper with Small Beads all beads with gripper set at 7#   held horizontally. Required set-up     Hand Exercises   Other Hand Exercises Composite P/ROM finger flexion into closed fist. Therapist released hold and Pt held closed fist for 5 seconds before completing full finger extension actively/                  OT Education - 03/29/20 1223    Education Details Fabricated peanut shape trigger point release device with 2 tennis balls as patient requested education on how to decrease neck soreness. Education provided to continue cervical A/ROM exercises that were provided at an earlier session, take rest breaks from prolonged cervical flexion due to weakness from prolonged cervical brace worn. Discussed gentle massage or use of trigger point  ball to complete either standing against wall/door or laying down.    Person(s) Educated Patient    Methods Explanation;Demonstration    Comprehension Verbalized understanding            OT Short Term Goals - 03/19/20 1448      OT SHORT TERM GOAL #1   Title Patient will be educated and independent with HEP in order to increase functional use of both arms while completing basic self care tasks.    Time 3    Period Weeks    Target Date 12/19/19      OT SHORT TERM GOAL #2   Title Patient will increase her BUE P/ROM to [facility] in order to increase ability to complete upper body dressing tasks with less difficulty.    Time 3    Period Weeks      OT SHORT TERM GOAL #3   Title Patient will increase bilateral hand grip strength by 5# and pinch strength by 2# in order to increase ability to open jars and containers  that are not tight.    Time 3    Period Weeks      OT SHORT TERM GOAL #4   Title Patient will increase her left hand coordination by completing the 9 hole peg test in under 1 minute in order to participate in simple housekeeping tasks with less difficulty.    Time 3    Period Weeks             OT Long Term Goals - 03/19/20 1448      OT LONG TERM GOAL #1   Title Patient will return to using both arms for all daily tasks while using her LUE for 75% of more of daily tasks.    Baseline 1/13: Pt reports using LUE for 50%    Time 8    Period Weeks    Status On-going      OT LONG TERM GOAL #2   Title Patient will report a decrease in pain in BUE during functional tasks of approximately 3/10 or less.    Time 8    Period Weeks      OT LONG TERM GOAL #3   Title Patient will increase BUE shoulder, wrist, and hand A/ROM to WNL or as close as possible in order to decrease difficulty level during dressing and functional reaching tasks.    Time 8    Period Weeks    Status On-going      OT LONG TERM GOAL #4   Title Patient will increase grip strength in the right hand to 45#, left hand to 10#, and left pinch strength to 5# in order to increase ability to manipulate and open jars and containers without needing assistance.    Baseline Update 12/1:Patient has met goal for grip and pinch. Goal updated to continue progress in the therapy. See goal #9.    Time 8    Period Weeks      OT LONG TERM GOAL #5   Title Patient will decrease the bilateral UE fascial restrictions to min amount or less in order to increase her functional mobility needed to complete desire housekeeping tasks.    Time 8    Period Weeks      OT LONG TERM GOAL #6   Title Patient will increase her left hand coordination by completing the 9 hole peg test in 45" or better to allow her to manipulate small items at home such as buttons, zippers, etc.  Baseline Update 12/1: Patient has met goal for coordination. Goal updated  to continue progress. See goal #8    Time 8    Period Weeks      OT LONG TERM GOAL #7   Title Patient will improve BUE strength to at least a 4-/5 in order to promote patient's ability to complete daily self-care tasks with less difficulty.    Time 8    Period Weeks      OT LONG TERM GOAL #8   Title Patient will increase her left hand coordination by completing the 9 hole peg test in 30" or better to allow her to manipulate small items at home such as buttons, zippers, etc.    Baseline 1/13: 31.7"    Time 6    Period Weeks    Status On-going      OT LONG TERM GOAL  #9   TITLE Patient will increase grip strength in the left hand to 25#, and left pinch strength to 10# in order to increase ability to manipulate and open jars and containers without needing assistance.    Baseline 1/13: left grip strength: 20#. left pinch lateral: 6# 3 point: 7#    Time 6    Period Weeks    Status On-going                 Plan - 03/29/20 1232    Clinical Impression Statement A: Focused on increasing functional use of left hand in order to form and hold a closed fist. Pt is able to achieve full fist after passive stretch and hold although sustained strength to maintain fist is difficult. Attempted to hold 1# weight to complete wrist extension although continues to demonstrate inability to complete movement when hand is forming a fist. Is able to complete movement easily with fingers extended.    Body Structure / Function / Physical Skills ADL;Fascial restriction;UE functional use;Pain;FMC;ROM;GMC;Coordination;Decreased knowledge of precautions    Plan P: Follow up on use of trigger point bal provided at last session. Continue to work on wrist extension strengthening. Wonder if patient would be able to complete wrist extension while using NMES unit to assist with wrist extension. (I could also try this when I see her next.) She definitely seems to have neuromuscular weakness from her SCI.    Consulted  and Agree with Plan of Care Patient           Patient will benefit from skilled therapeutic intervention in order to improve the following deficits and impairments:   Body Structure / Function / Physical Skills: ADL,Fascial restriction,UE functional use,Pain,FMC,ROM,GMC,Coordination,Decreased knowledge of precautions       Visit Diagnosis: Stiffness of left shoulder, not elsewhere classified  Other lack of coordination  Other symptoms and signs involving the musculoskeletal system  Acute pain of left shoulder    Problem List Patient Active Problem List   Diagnosis Date Noted  . Primary hypertension 01/23/2020  . Need for immunization against influenza 01/23/2020  . Encounter to establish care 01/19/2020  . Acute blood loss anemia 09/04/2019  . Drainage from wound 09/04/2019  . 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 Neuropathy 09/04/2019  . Neurogenic bowel 09/04/2019  . Central cord syndrome ([place]) 08/07/2019  . Thoracic spine fracture [facility]) 08/03/2019    Ailene Ravel, OTR/L,CBIS  [phone]  03/29/2020, 12:37 PM  [place] [address] [place], [place], [address] Phone: [phone]   Fax:  [phone]  Name: Shelly Houston MRN:  [id number] Date of Birth: [birth year]

## 2020-04-02 ENCOUNTER — Other Ambulatory Visit: Payer: Self-pay

## 2020-04-02 ENCOUNTER — Encounter (HOSPITAL_COMMUNITY): Payer: Self-pay | Admitting: Occupational Therapy

## 2020-04-02 ENCOUNTER — Ambulatory Visit (HOSPITAL_COMMUNITY): Payer: Medicaid Other | Attending: Physical Medicine and Rehabilitation | Admitting: Occupational Therapy

## 2020-04-02 DIAGNOSIS — M25512 Pain in left shoulder: Secondary | ICD-10-CM | POA: Diagnosis not present

## 2020-04-02 DIAGNOSIS — R278 Other lack of coordination: Secondary | ICD-10-CM | POA: Insufficient documentation

## 2020-04-02 DIAGNOSIS — R29898 Other symptoms and signs involving the musculoskeletal system: Secondary | ICD-10-CM | POA: Insufficient documentation

## 2020-04-02 DIAGNOSIS — M25611 Stiffness of right shoulder, not elsewhere classified: Secondary | ICD-10-CM | POA: Diagnosis not present

## 2020-04-02 DIAGNOSIS — Z419 Encounter for procedure for purposes other than remedying health state, unspecified: Secondary | ICD-10-CM | POA: Diagnosis not present

## 2020-04-02 DIAGNOSIS — M25612 Stiffness of left shoulder, not elsewhere classified: Secondary | ICD-10-CM | POA: Insufficient documentation

## 2020-04-02 NOTE — Therapy (Signed)
[place], [place], [address] Phone: [phone]   Fax:  [phone]  Occupational Therapy Treatment  Patient Details  Name: Shelly Houston MRN: [id number] Date of Birth: 1970/05/04 Referring Provider (OT): -- (Sending all re-certs and progress notes to Dr. Ihor Dow - PCP)   Encounter Date: 04/02/2020   OT End of Session - 04/02/20 1215    Visit Number 22    Number of Visits 30    Date for OT Re-Evaluation 04/25/20    Authorization Type [employer]    Authorization Time Period 12 visist approved (02/12/20-07/27/20)    Authorization - Visit Number 4    Authorization - Number of Visits 12    OT Start Time 1130   arrived late   OT Stop Time 1203    OT Time Calculation (min) 33 min    Activity Tolerance Patient tolerated treatment well    Behavior During Therapy [facility] for tasks assessed/performed           Past Medical History:  Diagnosis Date  . Central cord syndrome ([place])   . Primary hypertension 01/23/2020    Past Surgical History:  Procedure Laterality Date  . ABDOMINAL HYSTERECTOMY    . head surgery  2021    There were no vitals filed for this visit.   Subjective Assessment - 04/02/20 1129    Subjective  No pain today.    Patient is accompanied by: --   alone today   Currently in Pain? No/denies              [facility] OT Assessment - 04/02/20 1208      Assessment   Medical Diagnosis Central cord syndrome      Precautions   Precautions Cervical;Back                    OT Treatments/Exercises (OP) - 04/02/20 1135      Exercises   Exercises Hand;Wrist;Shoulder;Elbow      Additional Elbow Exercises   UBE (Upper Arm Bike) level 2 for 2' forward and reverse    Hand Gripper with Large Beads All beads with hand gripper at 7#   Significant difficulty if in neutral; graded to pronated grip with improved performance.   Hand Gripper with Medium Beads all beads with gripper at 7#    cant difficulty if in neutral; graded to pronated grip with improved performance.   Hand Gripper with Small Beads #7 gripper ; maximal difficulty with discontinuation after ~2 attempts.      Wrist Exercises   Wrist Extension AROM;10 reps;AAROM;Other reps (comment);Left   3x10 reps of a/arom with 1# dowel rod     Additional Wrist Exercises   Sponges stacking sponges initially with w/ red clip grading to yellow ; max difficulty three point  pinch; graded to lateral pinch with mod to max difficulty and c/o fatigue. Able to stack 2 to 3 on 2 towers.      Hand Exercises   Other Hand Exercises red clips in three point pinch w/ cues for wrist extension and physical assist to block compensation w/ elbow flexion. Worked clips up 3 bars.    Other Hand Exercises --                  OT Education - 04/02/20 1205    Education Details Pt instructed on how to complete A/AROM with a broom stick or other rod at home. Picture drawn with instructions for 3x10  reps twice a day.    Person(s) Educated Patient    Methods Explanation;Demonstration    Comprehension Verbalized understanding            OT Short Term Goals - 03/19/20 1448      OT SHORT TERM GOAL #1   Title Patient will be educated and independent with HEP in order to increase functional use of both arms while completing basic self care tasks.    Time 3    Period Weeks    Target Date 12/19/19      OT SHORT TERM GOAL #2   Title Patient will increase her BUE P/ROM to [facility] in order to increase ability to complete upper body dressing tasks with less difficulty.    Time 3    Period Weeks      OT SHORT TERM GOAL #3   Title Patient will increase bilateral hand grip strength by 5# and pinch strength by 2# in order to increase ability to open jars and containers that are not tight.    Time 3    Period Weeks      OT SHORT TERM GOAL #4   Title Patient will increase her left hand coordination by completing the 9 hole peg test in under 1  minute in order to participate in simple housekeeping tasks with less difficulty.    Time 3    Period Weeks             OT Long Term Goals - 03/19/20 1448      OT LONG TERM GOAL #1   Title Patient will return to using both arms for all daily tasks while using her LUE for 75% of more of daily tasks.    Baseline 1/13: Pt reports using LUE for 50%    Time 8    Period Weeks    Status On-going      OT LONG TERM GOAL #2   Title Patient will report a decrease in pain in BUE during functional tasks of approximately 3/10 or less.    Time 8    Period Weeks      OT LONG TERM GOAL #3   Title Patient will increase BUE shoulder, wrist, and hand A/ROM to WNL or as close as possible in order to decrease difficulty level during dressing and functional reaching tasks.    Time 8    Period Weeks    Status On-going      OT LONG TERM GOAL #4   Title Patient will increase grip strength in the right hand to 45#, left hand to 10#, and left pinch strength to 5# in order to increase ability to manipulate and open jars and containers without needing assistance.    Baseline Update 12/1:Patient has met goal for grip and pinch. Goal updated to continue progress in the therapy. See goal #9.    Time 8    Period Weeks      OT LONG TERM GOAL #5   Title Patient will decrease the bilateral UE fascial restrictions to min amount or less in order to increase her functional mobility needed to complete desire housekeeping tasks.    Time 8    Period Weeks      OT LONG TERM GOAL #6   Title Patient will increase her left hand coordination by completing the 9 hole peg test in 45" or better to allow her to manipulate small items at home such as buttons, zippers, etc.    Baseline Update 12/1: Patient  has met goal for coordination. Goal updated to continue progress. See goal #8    Time 8    Period Weeks      OT LONG TERM GOAL #7   Title Patient will improve BUE strength to at least a 4-/5 in order to promote patient's  ability to complete daily self-care tasks with less difficulty.    Time 8    Period Weeks      OT LONG TERM GOAL #8   Title Patient will increase her left hand coordination by completing the 9 hole peg test in 30" or better to allow her to manipulate small items at home such as buttons, zippers, etc.    Baseline 1/13: 31.7"    Time 6    Period Weeks    Status On-going      OT LONG TERM GOAL  #9   TITLE Patient will increase grip strength in the left hand to 25#, and left pinch strength to 10# in order to increase ability to manipulate and open jars and containers without needing assistance.    Baseline 1/13: left grip strength: 20#. left pinch lateral: 6# 3 point: 7#    Time 6    Period Weeks    Status On-going                 Plan - 04/02/20 1216    Clinical Impression Statement A: Focused on grip strengthening and wrist extension this date. Pt demonstrated ability to use red clips to place and remove on horizontal bars, but later had difficulty with red and even yellow clips for sponge stacking task.Maximal dificulty to use gripper at 7# to pick up small beads with wrist in neutral or pronated. Unable to complete wrist extesnion with 1# weight. Educated and able to complete 3x10 reps of A/AROM for wrist extension. Pt appeared to fatigue and have more difficulty with pinch strength and coordination tasks towards the end of session.    Body Structure / Function / Physical Skills ADL;Fascial restriction;UE functional use;Pain;FMC;ROM;GMC;Coordination;Decreased knowledge of precautions    Plan P: Follow up with use of A/AROM for wrist extension. Continue to work on wrist extension and pinch/grip strength. Possibly use NMES to assist with wrist extension per previous plan.    OT Home Exercise Plan eval: table slides (flexion, abduction), 10/4: AA/ROM wrist and elbow (flexion, extension) 11/8: AA/ROM supine and seated 11/15: A/ROM shoulder exercises.12/1: yellow putty (left hand) and red  putty (right hand) for grip and pinch. 12/23: passive composite digit flexion, finger opposition, finger adduction/abduction, wrist extension A/ROM; 2/1 :drawn out instructions for A/AROM wrist extension    Consulted and Agree with Plan of Care Patient           Patient will benefit from skilled therapeutic intervention in order to improve the following deficits and impairments:   Body Structure / Function / Physical Skills: ADL,Fascial restriction,UE functional use,Pain,FMC,ROM,GMC,Coordination,Decreased knowledge of precautions       Visit Diagnosis: Other lack of coordination  Stiffness of left shoulder, not elsewhere classified  Other symptoms and signs involving the musculoskeletal system    Problem List Patient Active Problem List   Diagnosis Date Noted  . Primary hypertension 01/23/2020  . Need for immunization against influenza 01/23/2020  . Encounter to establish care 01/19/2020  . Acute blood loss anemia 09/04/2019  . Drainage from wound 09/04/2019  . 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 Neuropathy 09/04/2019  . Neurogenic bowel 09/04/2019  . Central cord syndrome ([place]) 08/07/2019  .  Thoracic spine fracture [facility]) 08/03/2019   Larey Seat OT, MOT   Larey Seat 04/02/2020, 12:25 PM  [place], [place], [address] Phone: [phone]   Fax:  [phone]  Name: SMRITI BARKOW MRN: [id number] Date of Birth: 1971-01-12

## 2020-04-02 NOTE — Patient Instructions (Signed)
See education section.

## 2020-04-05 ENCOUNTER — Other Ambulatory Visit: Payer: Self-pay

## 2020-04-05 ENCOUNTER — Encounter (HOSPITAL_COMMUNITY): Payer: Self-pay

## 2020-04-05 ENCOUNTER — Ambulatory Visit (HOSPITAL_COMMUNITY): Payer: Medicaid Other

## 2020-04-05 DIAGNOSIS — M25512 Pain in left shoulder: Secondary | ICD-10-CM

## 2020-04-05 DIAGNOSIS — M25612 Stiffness of left shoulder, not elsewhere classified: Secondary | ICD-10-CM | POA: Diagnosis not present

## 2020-04-05 DIAGNOSIS — R278 Other lack of coordination: Secondary | ICD-10-CM | POA: Diagnosis not present

## 2020-04-05 DIAGNOSIS — M25611 Stiffness of right shoulder, not elsewhere classified: Secondary | ICD-10-CM | POA: Diagnosis not present

## 2020-04-05 DIAGNOSIS — R29898 Other symptoms and signs involving the musculoskeletal system: Secondary | ICD-10-CM

## 2020-04-05 NOTE — Therapy (Signed)
[place], [place], [address] Phone: [phone]   Fax:  [phone]  Occupational Therapy Treatment  Patient Details  Name: Shelly Houston MRN: [id number] Date of Birth: [birth year] Referring Provider (OT): -- (Sending all re-certs and progress notes to Dr. Ihor Dow - PCP)   Encounter Date: 04/05/2020   OT End of Session - 04/05/20 1226    Visit Number 23    Number of Visits 30    Date for OT Re-Evaluation 04/25/20    Authorization Type [employer]    Authorization Time Period 12 visist approved (02/12/20-07/27/20)    Authorization - Visit Number 5    Authorization - Number of Visits 12    OT Start Time 0737   pt arrived late   OT Stop Time 1157    OT Time Calculation (min) 34 min    Activity Tolerance Patient tolerated treatment well    Behavior During Therapy [facility] for tasks assessed/performed           Past Medical History:  Diagnosis Date  . Central cord syndrome ([place])   . Primary hypertension 01/23/2020    Past Surgical History:  Procedure Laterality Date  . ABDOMINAL HYSTERECTOMY    . head surgery  2021    There were no vitals filed for this visit.   Subjective Assessment - 04/05/20 1222    Subjective  S: I feel like I'm weaker than I was with my wrist.    Patient is accompanied by: Interpreter    Currently in Pain? No/denies              [facility] OT Assessment - 04/05/20 1222      Assessment   Medical Diagnosis Central cord syndrome      Precautions   Precautions Cervical;Back                    OT Treatments/Exercises (OP) - 04/05/20 1222      Exercises   Exercises Wrist      Wrist Exercises   Other wrist exercises Seated with forearm positioned on blue half roll, utilized NMES to faciliate left wrist extension. Complete action as active assisted with unit. Completed wrist extension with fingers extended then transitioned to a closed fist then finished time while  holding a 1# hand weight. Once NMES treatment completed, patient was able to complete A/ROM wrist extension with a closed fist 10-12X. While holding 1# hand weight, patient then completed wrist extension 5X, then 9#      Modalities   Modalities Electrical Stimulation      Electrical Stimulation   Electrical Stimulation Location left wrist extensors    Electrical Stimulation Action Engineer, petroleum Parameters 26 mA CC    Electrical Stimulation Goals Strength;Neuromuscular facilitation                    OT Short Term Goals - 03/19/20 1448      OT SHORT TERM GOAL #1   Title Patient will be educated and independent with HEP in order to increase functional use of both arms while completing basic self care tasks.    Time 3    Period Weeks    Target Date 12/19/19      OT SHORT TERM GOAL #2   Title Patient will increase her BUE P/ROM to [facility] in order to increase ability to complete upper body dressing tasks with less difficulty.    Time 3  Period Weeks      OT SHORT TERM GOAL #3   Title Patient will increase bilateral hand grip strength by 5# and pinch strength by 2# in order to increase ability to open jars and containers that are not tight.    Time 3    Period Weeks      OT SHORT TERM GOAL #4   Title Patient will increase her left hand coordination by completing the 9 hole peg test in under 1 minute in order to participate in simple housekeeping tasks with less difficulty.    Time 3    Period Weeks             OT Long Term Goals - 03/19/20 1448      OT LONG TERM GOAL #1   Title Patient will return to using both arms for all daily tasks while using her LUE for 75% of more of daily tasks.    Baseline 1/13: Pt reports using LUE for 50%    Time 8    Period Weeks    Status On-going      OT LONG TERM GOAL #2   Title Patient will report a decrease in pain in BUE during functional tasks of approximately 3/10 or less.    Time 8    Period Weeks       OT LONG TERM GOAL #3   Title Patient will increase BUE shoulder, wrist, and hand A/ROM to WNL or as close as possible in order to decrease difficulty level during dressing and functional reaching tasks.    Time 8    Period Weeks    Status On-going      OT LONG TERM GOAL #4   Title Patient will increase grip strength in the right hand to 45#, left hand to 10#, and left pinch strength to 5# in order to increase ability to manipulate and open jars and containers without needing assistance.    Baseline Update 12/1:Patient has met goal for grip and pinch. Goal updated to continue progress in the therapy. See goal #9.    Time 8    Period Weeks      OT LONG TERM GOAL #5   Title Patient will decrease the bilateral UE fascial restrictions to min amount or less in order to increase her functional mobility needed to complete desire housekeeping tasks.    Time 8    Period Weeks      OT LONG TERM GOAL #6   Title Patient will increase her left hand coordination by completing the 9 hole peg test in 45" or better to allow her to manipulate small items at home such as buttons, zippers, etc.    Baseline Update 12/1: Patient has met goal for coordination. Goal updated to continue progress. See goal #8    Time 8    Period Weeks      OT LONG TERM GOAL #7   Title Patient will improve BUE strength to at least a 4-/5 in order to promote patient's ability to complete daily self-care tasks with less difficulty.    Time 8    Period Weeks      OT LONG TERM GOAL #8   Title Patient will increase her left hand coordination by completing the 9 hole peg test in 30" or better to allow her to manipulate small items at home such as buttons, zippers, etc.    Baseline 1/13: 31.7"    Time 6    Period Weeks  Status On-going      OT LONG TERM GOAL  #9   TITLE Patient will increase grip strength in the left hand to 25#, and left pinch strength to 10# in order to increase ability to manipulate and open jars and  containers without needing assistance.    Baseline 1/13: left grip strength: 20#. left pinch lateral: 6# 3 point: 7#    Time 6    Period Weeks    Status On-going                 Plan - 04/05/20 1234    Clinical Impression Statement A: NMES utilized this session to increase strength and provide nueromuscular stimulation to left wrist extensors. Patient was able to experience full wrist extension with assist from ES. With fingers extended and with hand in a fist then transitioning to holding a 1# hand weight, patient was able to complete wrist extension.When treatment was finished using ES as an active assist, patient also demonstrated active wrist extension with hand in a fist. She then was able to hold a 1# weight and complete movement for the first time as well. Muscle fatigue noted and is present for wrist extension, as patient quickly fatigued and range gradually became less and less. Education provided on using NMES to assist with strength and nueromuscular facilitation. Pt reports pain in the finger pads of digits 2-5 whnen forming a fist which presents similar to nerve pain.    Body Structure / Function / Physical Skills ADL;Fascial restriction;UE functional use;Pain;FMC;ROM;GMC;Coordination;Decreased knowledge of precautions    Plan P: Continue with use of NMES to wrist extensors at start of session followed by active ROM activities and strenghtening activities involving the wrist extensors.    Consulted and Agree with Plan of Care Patient           Patient will benefit from skilled therapeutic intervention in order to improve the following deficits and impairments:   Body Structure / Function / Physical Skills: ADL,Fascial restriction,UE functional use,Pain,FMC,ROM,GMC,Coordination,Decreased knowledge of precautions       Visit Diagnosis: Other lack of coordination  Stiffness of left shoulder, not elsewhere classified  Other symptoms and signs involving the musculoskeletal  system  Acute pain of left shoulder    Problem List Patient Active Problem List   Diagnosis Date Noted  . Primary hypertension 01/23/2020  . Need for immunization against influenza 01/23/2020  . Encounter to establish care 01/19/2020  . Acute blood loss anemia 09/04/2019  . Drainage from wound 09/04/2019  . 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 Neuropathy 09/04/2019  . Neurogenic bowel 09/04/2019  . Central cord syndrome ([place]) 08/07/2019  . Thoracic spine fracture [facility]) 08/03/2019   Ailene Ravel, OTR/L,CBIS  [phone]  04/05/2020, 12:39 PM  [place] [address] [place], [place], [address] Phone: [phone]   Fax:  [phone]  Name: Shelly Houston MRN: [id number] Date of Birth: 03/20/1970

## 2020-04-09 ENCOUNTER — Ambulatory Visit (HOSPITAL_COMMUNITY): Payer: Medicaid Other

## 2020-04-11 ENCOUNTER — Encounter (HOSPITAL_COMMUNITY): Payer: Self-pay | Admitting: Occupational Therapy

## 2020-04-11 ENCOUNTER — Other Ambulatory Visit: Payer: Self-pay

## 2020-04-11 ENCOUNTER — Ambulatory Visit (HOSPITAL_COMMUNITY): Payer: Medicaid Other | Admitting: Occupational Therapy

## 2020-04-11 DIAGNOSIS — R29898 Other symptoms and signs involving the musculoskeletal system: Secondary | ICD-10-CM | POA: Diagnosis not present

## 2020-04-11 DIAGNOSIS — M25612 Stiffness of left shoulder, not elsewhere classified: Secondary | ICD-10-CM | POA: Diagnosis not present

## 2020-04-11 DIAGNOSIS — R278 Other lack of coordination: Secondary | ICD-10-CM | POA: Diagnosis not present

## 2020-04-11 DIAGNOSIS — M25611 Stiffness of right shoulder, not elsewhere classified: Secondary | ICD-10-CM | POA: Diagnosis not present

## 2020-04-11 DIAGNOSIS — M25512 Pain in left shoulder: Secondary | ICD-10-CM | POA: Diagnosis not present

## 2020-04-11 NOTE — Therapy (Signed)
[place], [place], [address] Phone: [phone]   Fax:  [phone]  Occupational Therapy Treatment  Patient Details  Name: Shelly Houston MRN: [id number] Date of Birth: Sep 08, 1970 Referring Provider (OT): -- (Sending all re-certs and progress notes to Dr. Ihor Dow - PCP)   Encounter Date: 04/11/2020   OT End of Session - 04/11/20 1209    Visit Number 24    Number of Visits 30    Date for OT Re-Evaluation 04/25/20    Authorization Type [employer]    Authorization Time Period 12 visist approved (02/12/20-07/27/20)    Authorization - Visit Number 6    Authorization - Number of Visits 12    OT Start Time 5427    OT Stop Time 1205    OT Time Calculation (min) 41 min    Activity Tolerance Patient tolerated treatment well    Behavior During Therapy [facility] for tasks assessed/performed           Past Medical History:  Diagnosis Date  . Central cord syndrome ([place])   . Primary hypertension 01/23/2020    Past Surgical History:  Procedure Laterality Date  . ABDOMINAL HYSTERECTOMY    . head surgery  2021    There were no vitals filed for this visit.   Subjective Assessment - 04/11/20 1128    Subjective  S: I have a little weird sensation in my fingertips sometimes.    Currently in Pain? No/denies                        OT Treatments/Exercises (OP) - 04/11/20 1128      Exercises   Exercises Wrist;Hand      Wrist Exercises   Wrist Extension AROM;Strengthening;10 reps    Bar Weights/Barbell (Wrist Extension) 1 lb    Wrist Radial Deviation Strengthening;10 reps    Bar Weights/Barbell (Radial Deviation) 1 lb    Wrist Ulnar Deviation Strengthening;10 reps    Bar Weights/Barbell (Ulnar Deviation) 1 lb    Other wrist exercises Pt pulling squigz from tabletop keeping forearm on table and focusing on resisted wrist extension. Mod difficulty, 10X      Additional Wrist Exercises   Hand Gripper  with Large Beads all beads with gripper at 20#, vertical    Hand Gripper with Medium Beads all beads with gripper at 15#, vertical    Theraputty - Flatten red      Hand Exercises   Other Hand Exercises Pt using red clothespin to grasp and stack 4 piles of 3 blocks. Pt keeping forearm on table to promote wrist extension, 1 rest break during task    Other Hand Exercises pt using red pvc pipe to cut circles into red theraputty working on sustained grasp and wrist flexion/extension                    OT Short Term Goals - 03/19/20 1448      OT SHORT TERM GOAL #1   Title Patient will be educated and independent with HEP in order to increase functional use of both arms while completing basic self care tasks.    Time 3    Period Weeks    Target Date 12/19/19      OT SHORT TERM GOAL #2   Title Patient will increase her BUE P/ROM to [facility] in order to increase ability to complete upper body dressing tasks with less difficulty.    Time  3    Period Weeks      OT SHORT TERM GOAL #3   Title Patient will increase bilateral hand grip strength by 5# and pinch strength by 2# in order to increase ability to open jars and containers that are not tight.    Time 3    Period Weeks      OT SHORT TERM GOAL #4   Title Patient will increase her left hand coordination by completing the 9 hole peg test in under 1 minute in order to participate in simple housekeeping tasks with less difficulty.    Time 3    Period Weeks             OT Long Term Goals - 03/19/20 1448      OT LONG TERM GOAL #1   Title Patient will return to using both arms for all daily tasks while using her LUE for 75% of more of daily tasks.    Baseline 1/13: Pt reports using LUE for 50%    Time 8    Period Weeks    Status On-going      OT LONG TERM GOAL #2   Title Patient will report a decrease in pain in BUE during functional tasks of approximately 3/10 or less.    Time 8    Period Weeks      OT LONG TERM GOAL #3    Title Patient will increase BUE shoulder, wrist, and hand A/ROM to WNL or as close as possible in order to decrease difficulty level during dressing and functional reaching tasks.    Time 8    Period Weeks    Status On-going      OT LONG TERM GOAL #4   Title Patient will increase grip strength in the right hand to 45#, left hand to 10#, and left pinch strength to 5# in order to increase ability to manipulate and open jars and containers without needing assistance.    Baseline Update 12/1:Patient has met goal for grip and pinch. Goal updated to continue progress in the therapy. See goal #9.    Time 8    Period Weeks      OT LONG TERM GOAL #5   Title Patient will decrease the bilateral UE fascial restrictions to min amount or less in order to increase her functional mobility needed to complete desire housekeeping tasks.    Time 8    Period Weeks      OT LONG TERM GOAL #6   Title Patient will increase her left hand coordination by completing the 9 hole peg test in 45" or better to allow her to manipulate small items at home such as buttons, zippers, etc.    Baseline Update 12/1: Patient has met goal for coordination. Goal updated to continue progress. See goal #8    Time 8    Period Weeks      OT LONG TERM GOAL #7   Title Patient will improve BUE strength to at least a 4-/5 in order to promote patient's ability to complete daily self-care tasks with less difficulty.    Time 8    Period Weeks      OT LONG TERM GOAL #8   Title Patient will increase her left hand coordination by completing the 9 hole peg test in 30" or better to allow her to manipulate small items at home such as buttons, zippers, etc.    Baseline 1/13: 31.7"    Time 6    Period  Weeks    Status On-going      OT LONG TERM GOAL  #9   TITLE Patient will increase grip strength in the left hand to 25#, and left pinch strength to 10# in order to increase ability to manipulate and open jars and containers without needing  assistance.    Baseline 1/13: left grip strength: 20#. left pinch lateral: 6# 3 point: 7#    Time 6    Period Weeks    Status On-going                 Plan - 04/11/20 1157    Clinical Impression Statement A: Pt demonstrating wrist extension with fingers open and closed at beginning of session today, therefore no NMES utilized. Pt working on wrist strengthening using 1# weight as well as resisted wrist extension with squigz activity. Completed hand gripper activity with pt able to complete in vertical position with min difficulty keeping wrist in neutral position. Added red theraputty activity with pvc pipe for continued wrist extension/flexion strengthening and grip work.    Body Structure / Function / Physical Skills ADL;Fascial restriction;UE functional use;Pain;FMC;ROM;GMC;Coordination;Decreased knowledge of precautions    Plan P: Continue with wrist extensor strengthening and grip strengthening    Consulted and Agree with Plan of Care Patient           Patient will benefit from skilled therapeutic intervention in order to improve the following deficits and impairments:   Body Structure / Function / Physical Skills: ADL,Fascial restriction,UE functional use,Pain,FMC,ROM,GMC,Coordination,Decreased knowledge of precautions       Visit Diagnosis: Other symptoms and signs involving the musculoskeletal system    Problem List Patient Active Problem List   Diagnosis Date Noted  . Primary hypertension 01/23/2020  . Need for immunization against influenza 01/23/2020  . Encounter to establish care 01/19/2020  . Acute blood loss anemia 09/04/2019  . Drainage from wound 09/04/2019  . 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 Neuropathy 09/04/2019  . Neurogenic bowel 09/04/2019  . Central cord syndrome ([place]) 08/07/2019  . Thoracic spine fracture [facility]) 08/03/2019   Guadelupe Sabin, OTR/L  [phone] 04/11/2020, 12:10 PM  [place], [place], [address] Phone: [phone]   Fax:  [phone]  Name: Shelly Houston MRN: [id number] Date of Birth: 08-04-1970

## 2020-04-16 ENCOUNTER — Ambulatory Visit (HOSPITAL_COMMUNITY): Payer: Medicaid Other | Admitting: Occupational Therapy

## 2020-04-16 ENCOUNTER — Encounter (HOSPITAL_COMMUNITY): Payer: Self-pay | Admitting: Occupational Therapy

## 2020-04-16 ENCOUNTER — Other Ambulatory Visit: Payer: Self-pay

## 2020-04-16 DIAGNOSIS — R278 Other lack of coordination: Secondary | ICD-10-CM

## 2020-04-16 DIAGNOSIS — R29898 Other symptoms and signs involving the musculoskeletal system: Secondary | ICD-10-CM

## 2020-04-16 DIAGNOSIS — M25612 Stiffness of left shoulder, not elsewhere classified: Secondary | ICD-10-CM | POA: Diagnosis not present

## 2020-04-16 DIAGNOSIS — M25512 Pain in left shoulder: Secondary | ICD-10-CM | POA: Diagnosis not present

## 2020-04-16 DIAGNOSIS — M25611 Stiffness of right shoulder, not elsewhere classified: Secondary | ICD-10-CM | POA: Diagnosis not present

## 2020-04-16 NOTE — Therapy (Signed)
[place], [place], [address] Phone: [phone]   Fax:  [phone]  Occupational Therapy Treatment  Patient Details  Name: Shelly Houston MRN: [id number] Date of Birth: 1970/11/16 Referring Provider (OT): -- (Sending all re-certs and progress notes to Dr. Ihor Dow - PCP)   Encounter Date: 04/16/2020   OT End of Session - 04/16/20 1156    Visit Number 25    Number of Visits 30    Date for OT Re-Evaluation 04/25/20    Authorization Type [employer]    Authorization Time Period 12 visist approved (02/12/20-07/27/20)    Authorization - Visit Number 7    Authorization - Number of Visits 12    OT Start Time 1856    OT Stop Time 1155    OT Time Calculation (min) 39 min    Activity Tolerance Patient tolerated treatment well    Behavior During Therapy [facility] for tasks assessed/performed           Past Medical History:  Diagnosis Date  . Central cord syndrome ([place])   . Primary hypertension 01/23/2020    Past Surgical History:  Procedure Laterality Date  . ABDOMINAL HYSTERECTOMY    . head surgery  2021    There were no vitals filed for this visit.   Subjective Assessment - 04/16/20 1116    Subjective  S: It's doing better.    Patient is accompanied by: Interpreter    Currently in Pain? No/denies              [facility] OT Assessment - 04/16/20 1115      Assessment   Medical Diagnosis Central cord syndrome      Precautions   Precautions Cervical;Back                    OT Treatments/Exercises (OP) - 04/16/20 1119      Exercises   Exercises Wrist;Hand      Wrist Exercises   Wrist Flexion AROM;10 reps   arm in neutral for A/ROM with elbow propped on tabletop   Wrist Extension Strengthening;15 reps;AROM;10 reps   arm in neutral for A/ROM with elbow propped on tabletop   Bar Weights/Barbell (Wrist Extension) 1 lb    Wrist Radial Deviation Strengthening;15 reps   1#   Wrist Ulnar  Deviation Strengthening;15 reps   1#   Bar Weights/Barbell (Ulnar Deviation) 1 lb    Other wrist exercises Pt pulling squigz from tabletop keeping forearm on table and focusing on resisted wrist extension. Mod difficulty, 10X      Additional Wrist Exercises   Hand Gripper with Large Beads all beads with gripper at 20#, vertical    Hand Gripper with Medium Beads all beads with gripper at 20#, vertical    Hand Gripper with Small Beads all beads gripper at 15#, vertical      Hand Exercises   Other Hand Exercises Pt using red clothespin to grasp and stack 3 piles of 3 blocks. Pt keeping forearm on table to promote wrist extension,      Fine Motor Coordination (Hand/Wrist)   Fine Motor Coordination Flipping cards    Flipping cards Pt rotating card 2x then flipping 2x, mod difficulty with rotating as pt trying to use only index finger to rotate. Occasionally dropping cards on flip.                  OT Education - 04/16/20 1136    Education Details wrist  strengthening    Person(s) Educated Patient    Methods Explanation;Demonstration;Handout    Comprehension Verbalized understanding;Returned demonstration            OT Short Term Goals - 03/19/20 1448      OT SHORT TERM GOAL #1   Title Patient will be educated and independent with HEP in order to increase functional use of both arms while completing basic self care tasks.    Time 3    Period Weeks    Target Date 12/19/19      OT SHORT TERM GOAL #2   Title Patient will increase her BUE P/ROM to [facility] in order to increase ability to complete upper body dressing tasks with less difficulty.    Time 3    Period Weeks      OT SHORT TERM GOAL #3   Title Patient will increase bilateral hand grip strength by 5# and pinch strength by 2# in order to increase ability to open jars and containers that are not tight.    Time 3    Period Weeks      OT SHORT TERM GOAL #4   Title Patient will increase her left hand coordination by  completing the 9 hole peg test in under 1 minute in order to participate in simple housekeeping tasks with less difficulty.    Time 3    Period Weeks             OT Long Term Goals - 03/19/20 1448      OT LONG TERM GOAL #1   Title Patient will return to using both arms for all daily tasks while using her LUE for 75% of more of daily tasks.    Baseline 1/13: Pt reports using LUE for 50%    Time 8    Period Weeks    Status On-going      OT LONG TERM GOAL #2   Title Patient will report a decrease in pain in BUE during functional tasks of approximately 3/10 or less.    Time 8    Period Weeks      OT LONG TERM GOAL #3   Title Patient will increase BUE shoulder, wrist, and hand A/ROM to WNL or as close as possible in order to decrease difficulty level during dressing and functional reaching tasks.    Time 8    Period Weeks    Status On-going      OT LONG TERM GOAL #4   Title Patient will increase grip strength in the right hand to 45#, left hand to 10#, and left pinch strength to 5# in order to increase ability to manipulate and open jars and containers without needing assistance.    Baseline Update 12/1:Patient has met goal for grip and pinch. Goal updated to continue progress in the therapy. See goal #9.    Time 8    Period Weeks      OT LONG TERM GOAL #5   Title Patient will decrease the bilateral UE fascial restrictions to min amount or less in order to increase her functional mobility needed to complete desire housekeeping tasks.    Time 8    Period Weeks      OT LONG TERM GOAL #6   Title Patient will increase her left hand coordination by completing the 9 hole peg test in 45" or better to allow her to manipulate small items at home such as buttons, zippers, etc.    Baseline Update 12/1: Patient has met  goal for coordination. Goal updated to continue progress. See goal #8    Time 8    Period Weeks      OT LONG TERM GOAL #7   Title Patient will improve BUE strength to at  least a 4-/5 in order to promote patient's ability to complete daily self-care tasks with less difficulty.    Time 8    Period Weeks      OT LONG TERM GOAL #8   Title Patient will increase her left hand coordination by completing the 9 hole peg test in 30" or better to allow her to manipulate small items at home such as buttons, zippers, etc.    Baseline 1/13: 31.7"    Time 6    Period Weeks    Status On-going      OT LONG TERM GOAL  #9   TITLE Patient will increase grip strength in the left hand to 25#, and left pinch strength to 10# in order to increase ability to manipulate and open jars and containers without needing assistance.    Baseline 1/13: left grip strength: 20#. left pinch lateral: 6# 3 point: 7#    Time 6    Period Weeks    Status On-going                 Plan - 04/16/20 1148    Clinical Impression Statement A: Continued with wrist strengthening and extension work, mod difficulty with squigz today after wrist strengthening. Continued with hand gripper, pt able to complete all small beads with occasional rest breaks. Added fine motor task of card rotating/flipping, pt requiring increased time and verbal cuing for technique. Pt demonstrates improvement in wrist, grip, and pinch strength compared to prior sessions.    Body Structure / Function / Physical Skills ADL;Fascial restriction;UE functional use;Pain;FMC;ROM;GMC;Coordination;Decreased knowledge of precautions    Plan P: Continue with wrist extensor strengthening and grip strengthening, attempt wrist strengthening using yellow tubing or red theraband    OT Home Exercise Plan eval: table slides (flexion, abduction), 10/4: AA/ROM wrist and elbow (flexion, extension) 11/8: AA/ROM supine and seated 11/15: A/ROM shoulder exercises.12/1: yellow putty (left hand) and red putty (right hand) for grip and pinch. 12/23: passive composite digit flexion, finger opposition, finger adduction/abduction, wrist extension A/ROM; 2/1  :drawn out instructions for A/AROM wrist extension    Consulted and Agree with Plan of Care Patient           Patient will benefit from skilled therapeutic intervention in order to improve the following deficits and impairments:   Body Structure / Function / Physical Skills: ADL,Fascial restriction,UE functional use,Pain,FMC,ROM,GMC,Coordination,Decreased knowledge of precautions       Visit Diagnosis: Other symptoms and signs involving the musculoskeletal system  Other lack of coordination    Problem List Patient Active Problem List   Diagnosis Date Noted  . Primary hypertension 01/23/2020  . Need for immunization against influenza 01/23/2020  . Encounter to establish care 01/19/2020  . Acute blood loss anemia 09/04/2019  . Drainage from wound 09/04/2019  . 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 Neuropathy 09/04/2019  . Neurogenic bowel 09/04/2019  . Central cord syndrome ([place]) 08/07/2019  . Thoracic spine fracture [facility]) 08/03/2019   Guadelupe Sabin, OTR/L  [phone] 04/16/2020, 11:58 AM  [place] [address] [place], [place], [address] Phone: [phone]   Fax:  [phone]  Name: Shelly Houston MRN: [id number] Date of Birth: 22-Jan-1971

## 2020-04-16 NOTE — Patient Instructions (Signed)
Strengthening Exercises:  *Complete exercises using __1__ pound weight, __10__times each, __2__times per day*  1) WRIST EXTENSION CURLS - TABLE  Hold a small free weight, rest your forearm on a table and bend your wrist up and down with your palm face down as shown.      2) WRIST FLEXION CURLS - TABLE  Hold a small free weight, rest your forearm on a table and bend your wrist up and down with your palm face up as shown.     3) FREE WEIGHT RADIAL/ULNAR DEVIATION - TABLE  Hold a small free weight, rest your forearm on a table and bend your wrist up and down with your palm facing towards the side as shown.     4) Pronation  Forearm supported on table with wrist in neutral position. Using a weight, roll wrist so that palm faces downward. Hold for 2 seconds and return to starting position.     5) Supination  Forearm supported on table with wrist in neutral position. Using a weight, roll wrist so that palm is now facing upward. Hold for 2 seconds and return to starting position.

## 2020-04-18 ENCOUNTER — Other Ambulatory Visit: Payer: Self-pay

## 2020-04-18 ENCOUNTER — Ambulatory Visit (HOSPITAL_COMMUNITY): Payer: Medicaid Other

## 2020-04-18 ENCOUNTER — Encounter (HOSPITAL_COMMUNITY): Payer: Self-pay

## 2020-04-18 DIAGNOSIS — R278 Other lack of coordination: Secondary | ICD-10-CM | POA: Diagnosis not present

## 2020-04-18 DIAGNOSIS — M25512 Pain in left shoulder: Secondary | ICD-10-CM | POA: Diagnosis not present

## 2020-04-18 DIAGNOSIS — R29898 Other symptoms and signs involving the musculoskeletal system: Secondary | ICD-10-CM

## 2020-04-18 DIAGNOSIS — M25611 Stiffness of right shoulder, not elsewhere classified: Secondary | ICD-10-CM | POA: Diagnosis not present

## 2020-04-18 DIAGNOSIS — M25612 Stiffness of left shoulder, not elsewhere classified: Secondary | ICD-10-CM | POA: Diagnosis not present

## 2020-04-18 NOTE — Therapy (Signed)
[place], [place], [address] Phone: [phone]   Fax:  [phone]  Occupational Therapy Treatment  Patient Details  Name: Shelly Houston MRN: [id number] Date of Birth: [birth year] Referring Provider (OT): -- (Sending all re-certs and progress notes to Dr. Ihor Dow - PCP)   Encounter Date: 04/18/2020   OT End of Session - 04/18/20 1234    Visit Number 26    Number of Visits 30    Date for OT Re-Evaluation 04/25/20    Authorization Type [employer]    Authorization Time Period 12 visist approved (02/12/20-07/27/20)    Authorization - Visit Number 8    Authorization - Number of Visits 12    OT Start Time 1125   Pt arrived late   OT Stop Time 1158    OT Time Calculation (min) 33 min    Activity Tolerance Patient tolerated treatment well    Behavior During Therapy [facility] for tasks assessed/performed           Past Medical History:  Diagnosis Date  . Central cord syndrome ([place])   . Primary hypertension 01/23/2020    Past Surgical History:  Procedure Laterality Date  . ABDOMINAL HYSTERECTOMY    . head surgery  2021    There were no vitals filed for this visit.   Subjective Assessment - 04/18/20 1133    Subjective  S: Do you have any neck exercises that I can do?    Patient is accompanied by: Interpreter    Currently in Pain? No/denies              [facility] OT Assessment - 04/18/20 1133      Assessment   Medical Diagnosis Central cord syndrome      Precautions   Precautions Cervical;Back                    OT Treatments/Exercises (OP) - 04/18/20 1133      Exercises   Exercises Wrist;Hand;Elbow      Elbow Exercises   Bar Weights/Barbell (Forearm Supination) 2 lbs   15X   Bar Weights/Barbell (Forearm Pronation) 2 lbs   15X     Additional Elbow Exercises   Theraputty - Roll red      Wrist Exercises   Other wrist exercises Focusing on resisted wrist extension, patient placed  10 Squigz on tabletop then removed with forearm resting on table.    Other wrist exercises Wrist flexion, extension, radial and ulnar deviation; 15X; 1# (2-3 second through movement/slow and controlled)                    OT Short Term Goals - 03/19/20 1448      OT SHORT TERM GOAL #1   Title Patient will be educated and independent with HEP in order to increase functional use of both arms while completing basic self care tasks.    Time 3    Period Weeks    Target Date 12/19/19      OT SHORT TERM GOAL #2   Title Patient will increase her BUE P/ROM to [facility] in order to increase ability to complete upper body dressing tasks with less difficulty.    Time 3    Period Weeks      OT SHORT TERM GOAL #3   Title Patient will increase bilateral hand grip strength by 5# and pinch strength by 2# in order to increase ability to open jars and containers that  are not tight.    Time 3    Period Weeks      OT SHORT TERM GOAL #4   Title Patient will increase her left hand coordination by completing the 9 hole peg test in under 1 minute in order to participate in simple housekeeping tasks with less difficulty.    Time 3    Period Weeks             OT Long Term Goals - 03/19/20 1448      OT LONG TERM GOAL #1   Title Patient will return to using both arms for all daily tasks while using her LUE for 75% of more of daily tasks.    Baseline 1/13: Pt reports using LUE for 50%    Time 8    Period Weeks    Status On-going      OT LONG TERM GOAL #2   Title Patient will report a decrease in pain in BUE during functional tasks of approximately 3/10 or less.    Time 8    Period Weeks      OT LONG TERM GOAL #3   Title Patient will increase BUE shoulder, wrist, and hand A/ROM to WNL or as close as possible in order to decrease difficulty level during dressing and functional reaching tasks.    Time 8    Period Weeks    Status On-going      OT LONG TERM GOAL #4   Title Patient will increase  grip strength in the right hand to 45#, left hand to 10#, and left pinch strength to 5# in order to increase ability to manipulate and open jars and containers without needing assistance.    Baseline Update 12/1:Patient has met goal for grip and pinch. Goal updated to continue progress in the therapy. See goal #9.    Time 8    Period Weeks      OT LONG TERM GOAL #5   Title Patient will decrease the bilateral UE fascial restrictions to min amount or less in order to increase her functional mobility needed to complete desire housekeeping tasks.    Time 8    Period Weeks      OT LONG TERM GOAL #6   Title Patient will increase her left hand coordination by completing the 9 hole peg test in 45" or better to allow her to manipulate small items at home such as buttons, zippers, etc.    Baseline Update 12/1: Patient has met goal for coordination. Goal updated to continue progress. See goal #8    Time 8    Period Weeks      OT LONG TERM GOAL #7   Title Patient will improve BUE strength to at least a 4-/5 in order to promote patient's ability to complete daily self-care tasks with less difficulty.    Time 8    Period Weeks      OT LONG TERM GOAL #8   Title Patient will increase her left hand coordination by completing the 9 hole peg test in 30" or better to allow her to manipulate small items at home such as buttons, zippers, etc.    Baseline 1/13: 31.7"    Time 6    Period Weeks    Status On-going      OT LONG TERM GOAL  #9   TITLE Patient will increase grip strength in the left hand to 25#, and left pinch strength to 10# in order to increase ability to manipulate  and open jars and containers without needing assistance.    Baseline 1/13: left grip strength: 20#. left pinch lateral: 6# 3 point: 7#    Time 6    Period Weeks    Status On-going                 Plan - 04/18/20 1234    Clinical Impression Statement A: Focused on wrist strengthening and grip strengthening during session.  Attempted to increase to 2# hand weight for strengthening although unable to tolerate due to pain and remained with 1#. Was able to tolerate increased weight to perform supination and pronation. Half circle wedge used for all strengthening exercises on table with elbow remaining on table. VC and occasial tactile cues provided for form and technique. Pt requested at end of session for neck exercises to increase her posture. Pt reports that she notices her posture isn't good when she standing for a long time. Will place in plan for next session.    Body Structure / Function / Physical Skills ADL;Fascial restriction;UE functional use;Pain;FMC;ROM;GMC;Coordination;Decreased knowledge of precautions    Plan P: At start of session attempt scapular strengthening using red band for postural strength.    Consulted and Agree with Plan of Care Patient           Patient will benefit from skilled therapeutic intervention in order to improve the following deficits and impairments:   Body Structure / Function / Physical Skills: ADL,Fascial restriction,UE functional use,Pain,FMC,ROM,GMC,Coordination,Decreased knowledge of precautions       Visit Diagnosis: Other lack of coordination  Other symptoms and signs involving the musculoskeletal system    Problem List Patient Active Problem List   Diagnosis Date Noted  . Primary hypertension 01/23/2020  . Need for immunization against influenza 01/23/2020  . Encounter to establish care 01/19/2020  . Acute blood loss anemia 09/04/2019  . Drainage from wound 09/04/2019  . 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 Neuropathy 09/04/2019  . Neurogenic bowel 09/04/2019  . Central cord syndrome ([place]) 08/07/2019  . Thoracic spine fracture [facility]) 08/03/2019     Ailene Ravel, OTR/L,CBIS  [phone]  04/18/2020, 12:49 PM  [place] [address] [place], [place], [address] Phone: [phone]   Fax:   [phone]  Name: Shelly Houston MRN: [id number] Date of Birth: 12/31/1970

## 2020-04-23 ENCOUNTER — Other Ambulatory Visit: Payer: Self-pay

## 2020-04-23 ENCOUNTER — Encounter (HOSPITAL_COMMUNITY): Payer: Self-pay | Admitting: Occupational Therapy

## 2020-04-23 ENCOUNTER — Ambulatory Visit (HOSPITAL_COMMUNITY): Payer: Medicaid Other | Admitting: Occupational Therapy

## 2020-04-23 DIAGNOSIS — M25612 Stiffness of left shoulder, not elsewhere classified: Secondary | ICD-10-CM | POA: Diagnosis not present

## 2020-04-23 DIAGNOSIS — R278 Other lack of coordination: Secondary | ICD-10-CM | POA: Diagnosis not present

## 2020-04-23 DIAGNOSIS — M25512 Pain in left shoulder: Secondary | ICD-10-CM | POA: Diagnosis not present

## 2020-04-23 DIAGNOSIS — M25611 Stiffness of right shoulder, not elsewhere classified: Secondary | ICD-10-CM | POA: Diagnosis not present

## 2020-04-23 DIAGNOSIS — R29898 Other symptoms and signs involving the musculoskeletal system: Secondary | ICD-10-CM

## 2020-04-23 NOTE — Therapy (Signed)
[place], [place], [address] Phone: [phone]   Fax:  [phone]  Occupational Therapy Treatment  Patient Details  Name: Shelly Houston MRN: [id number] Date of Birth: 1971-01-16 Referring Provider (OT): -- (Sending all re-certs and progress notes to Dr. Ihor Dow - PCP)   Encounter Date: 04/23/2020   OT End of Session - 04/23/20 1200    Visit Number 27    Number of Visits 30    Date for OT Re-Evaluation 04/25/20    Authorization Type [employer]    Authorization Time Period 12 visist approved (02/12/20-07/27/20)    Authorization - Visit Number 9    Authorization - Number of Visits 12    OT Start Time 1119    OT Stop Time 1158    OT Time Calculation (min) 39 min    Activity Tolerance Patient tolerated treatment well    Behavior During Therapy [facility] for tasks assessed/performed           Past Medical History:  Diagnosis Date  . Central cord syndrome ([place])   . Primary hypertension 01/23/2020    Past Surgical History:  Procedure Laterality Date  . ABDOMINAL HYSTERECTOMY    . head surgery  2021    There were no vitals filed for this visit.   Subjective Assessment - 04/23/20 1120    Subjective  S: Two times per day. (when asked how often she is completing exercises)    Currently in Pain? No/denies              [facility] OT Assessment - 04/23/20 1120      Assessment   Medical Diagnosis Central cord syndrome      Precautions   Precautions Cervical;Back                    OT Treatments/Exercises (OP) - 04/23/20 1122      Exercises   Exercises Wrist;Hand;Elbow      Shoulder Exercises: Standing   Extension Theraband;12 reps    Theraband Level (Shoulder Extension) Level 2 (Red)    Row Theraband;12 reps    Theraband Level (Shoulder Row) Level 2 (Red)    Retraction Theraband;12 reps    Theraband Level (Shoulder Retraction) Level 2 (Red)      Additional Elbow Exercises   Hand  Gripper with Large Beads all beads with gripper at 20#, vertical    Hand Gripper with Medium Beads all beads with gripper at 20#, vertical    Hand Gripper with Small Beads all beads gripper at 20#, horizontal      Wrist Exercises   Other wrist exercises velcro board: supination/pronation 2x down and back    Other wrist exercises Wrist flexion, extension, radial and ulnar deviation; 15X; 1# (2-3 second through movement/slow and controlled)      Hand Exercises   Other Hand Exercises Pt using red clothespin to grasp 8 sponge and place into bucket. Pt unable to open all the way to grasp sponge completely, activity focusing on opening clip as far as she could using 3 point pinch to grasp sponge    Other Hand Exercises Pt using pvc pipe in yellow putty to push into putty and then pull pipe towards her. Wroking on wrist stability and wrist/grip strength during task                  OT Education - 04/23/20 1123    Education Details red scapular theraband    Person(s)  Educated Patient    Methods Explanation;Demonstration;Handout    Comprehension Verbalized understanding;Returned demonstration            OT Short Term Goals - 03/19/20 1448      OT SHORT TERM GOAL #1   Title Patient will be educated and independent with HEP in order to increase functional use of both arms while completing basic self care tasks.    Time 3    Period Weeks    Target Date 12/19/19      OT SHORT TERM GOAL #2   Title Patient will increase her BUE P/ROM to [facility] in order to increase ability to complete upper body dressing tasks with less difficulty.    Time 3    Period Weeks      OT SHORT TERM GOAL #3   Title Patient will increase bilateral hand grip strength by 5# and pinch strength by 2# in order to increase ability to open jars and containers that are not tight.    Time 3    Period Weeks      OT SHORT TERM GOAL #4   Title Patient will increase her left hand coordination by completing the 9 hole peg  test in under 1 minute in order to participate in simple housekeeping tasks with less difficulty.    Time 3    Period Weeks             OT Long Term Goals - 03/19/20 1448      OT LONG TERM GOAL #1   Title Patient will return to using both arms for all daily tasks while using her LUE for 75% of more of daily tasks.    Baseline 1/13: Pt reports using LUE for 50%    Time 8    Period Weeks    Status On-going      OT LONG TERM GOAL #2   Title Patient will report a decrease in pain in BUE during functional tasks of approximately 3/10 or less.    Time 8    Period Weeks      OT LONG TERM GOAL #3   Title Patient will increase BUE shoulder, wrist, and hand A/ROM to WNL or as close as possible in order to decrease difficulty level during dressing and functional reaching tasks.    Time 8    Period Weeks    Status On-going      OT LONG TERM GOAL #4   Title Patient will increase grip strength in the right hand to 45#, left hand to 10#, and left pinch strength to 5# in order to increase ability to manipulate and open jars and containers without needing assistance.    Baseline Update 12/1:Patient has met goal for grip and pinch. Goal updated to continue progress in the therapy. See goal #9.    Time 8    Period Weeks      OT LONG TERM GOAL #5   Title Patient will decrease the bilateral UE fascial restrictions to min amount or less in order to increase her functional mobility needed to complete desire housekeeping tasks.    Time 8    Period Weeks      OT LONG TERM GOAL #6   Title Patient will increase her left hand coordination by completing the 9 hole peg test in 45" or better to allow her to manipulate small items at home such as buttons, zippers, etc.    Baseline Update 12/1: Patient has met goal for coordination. Goal updated  to continue progress. See goal #8    Time 8    Period Weeks      OT LONG TERM GOAL #7   Title Patient will improve BUE strength to at least a 4-/5 in order to  promote patient's ability to complete daily self-care tasks with less difficulty.    Time 8    Period Weeks      OT LONG TERM GOAL #8   Title Patient will increase her left hand coordination by completing the 9 hole peg test in 30" or better to allow her to manipulate small items at home such as buttons, zippers, etc.    Baseline 1/13: 31.7"    Time 6    Period Weeks    Status On-going      OT LONG TERM GOAL  #9   TITLE Patient will increase grip strength in the left hand to 25#, and left pinch strength to 10# in order to increase ability to manipulate and open jars and containers without needing assistance.    Baseline 1/13: left grip strength: 20#. left pinch lateral: 6# 3 point: 7#    Time 6    Period Weeks    Status On-going                 Plan - 04/23/20 1157    Clinical Impression Statement A: Completed scapular theraband at beginning of session, verbal cuing, visual demonstration provided for correct form and technique during completion. Pt with increased weakness when attempting extension. Continued with wrist strengthening and stability tasks, added theraputty pull with pvc pipe. Also added velcro board task using small cylindrical tool for supination/prontation. Pt with increased fatigue at end of session when attempting clothespin activity. Verbal cuing for form and technique during tasks.    Body Structure / Function / Physical Skills ADL;Fascial restriction;UE functional use;Pain;FMC;ROM;GMC;Coordination;Decreased knowledge of precautions    Plan P: Reassessment, determine if ready for discharge with HEP. Follow up on scapular theraband    OT Home Exercise Plan eval: table slides (flexion, abduction), 10/4: AA/ROM wrist and elbow (flexion, extension) 11/8: AA/ROM supine and seated 11/15: A/ROM shoulder exercises.12/1: yellow putty (left hand) and red putty (right hand) for grip and pinch. 12/23: passive composite digit flexion, finger opposition, finger  adduction/abduction, wrist extension A/ROM; 2/1 :drawn out instructions for A/AROM wrist extension    Consulted and Agree with Plan of Care Patient           Patient will benefit from skilled therapeutic intervention in order to improve the following deficits and impairments:   Body Structure / Function / Physical Skills: ADL,Fascial restriction,UE functional use,Pain,FMC,ROM,GMC,Coordination,Decreased knowledge of precautions       Visit Diagnosis: Other lack of coordination  Other symptoms and signs involving the musculoskeletal system    Problem List Patient Active Problem List   Diagnosis Date Noted  . Primary hypertension 01/23/2020  . Need for immunization against influenza 01/23/2020  . Encounter to establish care 01/19/2020  . Acute blood loss anemia 09/04/2019  . Drainage from wound 09/04/2019  . 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 Neuropathy 09/04/2019  . Neurogenic bowel 09/04/2019  . Central cord syndrome ([place]) 08/07/2019  . Thoracic spine fracture [facility]) 08/03/2019   Guadelupe Sabin, OTR/L  [phone] 04/23/2020, 12:00 PM  [place], [place], [address] Phone: [phone]   Fax:  [phone]  Name: BERNETTE SEEMAN MRN: [id number] Date of Birth: [birth year]

## 2020-04-23 NOTE — Patient Instructions (Signed)

## 2020-04-25 ENCOUNTER — Encounter (HOSPITAL_COMMUNITY): Payer: Self-pay

## 2020-04-25 ENCOUNTER — Ambulatory Visit (HOSPITAL_COMMUNITY): Payer: Medicaid Other

## 2020-04-25 ENCOUNTER — Other Ambulatory Visit: Payer: Self-pay

## 2020-04-25 DIAGNOSIS — M25512 Pain in left shoulder: Secondary | ICD-10-CM | POA: Diagnosis not present

## 2020-04-25 DIAGNOSIS — M25611 Stiffness of right shoulder, not elsewhere classified: Secondary | ICD-10-CM

## 2020-04-25 DIAGNOSIS — R278 Other lack of coordination: Secondary | ICD-10-CM | POA: Diagnosis not present

## 2020-04-25 DIAGNOSIS — R29898 Other symptoms and signs involving the musculoskeletal system: Secondary | ICD-10-CM

## 2020-04-25 DIAGNOSIS — M25612 Stiffness of left shoulder, not elsewhere classified: Secondary | ICD-10-CM | POA: Diagnosis not present

## 2020-04-25 NOTE — Patient Instructions (Signed)
Access Code: [email or web address] URL: [email or web address] Date: 04/25/2020 Prepared by: Limmie Patricia  Exercises Estiramiento con flexi&oacute;n de hombros, de pie, en pared - 2 x daily - 7 x weekly - 1 sets - 2 reps - 20-30 hold Abducci&oacute;n de hombro, de pie, con deslizamiento en la pared y pulgares hacia afuera - 2 x daily - 7 x weekly - 1 sets - 2 reps - 20-30 hold

## 2020-04-25 NOTE — Therapy (Signed)
[place], [place], [address] Phone: [phone]   Fax:  [phone]  Occupational Therapy Treatment Reassessment/re-cert Patient Details  Name: Shelly Houston MRN: [id number] Date of Birth: 04-24-1970 Referring Provider (OT): Dr. Posey Pronto   Encounter Date: 04/25/2020   OT End of Session - 04/25/20 1142    Visit Number 28    Number of Visits 30    Date for OT Re-Evaluation 05/23/20    Authorization Type [employer]    Authorization Time Period 12 visist approved (02/12/20-07/27/20)    Authorization - Visit Number 10    Authorization - Number of Visits 12    OT Start Time 1120    OT Stop Time 1200    OT Time Calculation (min) 40 min    Activity Tolerance Patient tolerated treatment well    Behavior During Therapy [facility] for tasks assessed/performed           Past Medical History:  Diagnosis Date  . Central cord syndrome ([place])   . Primary hypertension 01/23/2020    Past Surgical History:  Procedure Laterality Date  . ABDOMINAL HYSTERECTOMY    . head surgery  2021    There were no vitals filed for this visit.   Subjective Assessment - 04/25/20 1123    Subjective  S: I would like you to be able to check on me. (when discussing possible discharge.)    Patient is accompanied by: Interpreter    Currently in Pain? No/denies              [facility] OT Assessment - 04/25/20 1123      Assessment   Medical Diagnosis Central cord syndrome    Referring Provider (OT) Dr. Posey Pronto    Onset Date/Surgical Date 08/03/19      Precautions   Precautions Cervical;Back      Prior Function   Level of Independence Independent      Coordination   9 Hole Peg Test Left    Left 9 Hole Peg Test 30.7"   previous: 31.7"     ROM / Strength   AROM / PROM / Strength Strength;AROM      AROM   Right Shoulder Flexion 146 Degrees   previous: 140   Right Shoulder ABduction 141 Degrees   previous: 138   Left Shoulder Flexion 100  Degrees   previous: 82   Left Shoulder ABduction 100 Degrees   previous: 90   Left Shoulder External Rotation 80 Degrees   previous: 66   Left Wrist Extension 70 Degrees   previous: 68   Left Wrist Flexion 80 Degrees   previous: 76     Strength   Strength Assessment Site Hand    Left Hand Grip (lbs) 25   previous: 20   Left Hand Lateral Pinch 8 lbs   previous: 6   Left Hand 3 Point Pinch 8 lbs   previous: 7                   OT Treatments/Exercises (OP) - 04/25/20 1251      Exercises   Exercises Shoulder      Shoulder Exercises: Stretch   Wall Stretch - Flexion 2 reps;20 seconds    Wall Stretch - ABduction 2 reps;20 seconds                  OT Education - 04/25/20 1203    Education Details Reviewed progress in therapy. Recommended continuing with red theraputty at  home, red theraband for scapular strengthening. Added shoulder flexion and abduction stretch    Person(s) Educated Patient    Methods Explanation;Demonstration;Verbal cues;Handout    Comprehension Returned demonstration;Verbalized understanding            OT Short Term Goals - 03/19/20 1448      OT SHORT TERM GOAL #1   Title Patient will be educated and independent with HEP in order to increase functional use of both arms while completing basic self care tasks.    Time 3    Period Weeks    Target Date 12/19/19      OT SHORT TERM GOAL #2   Title Patient will increase her BUE P/ROM to [facility] in order to increase ability to complete upper body dressing tasks with less difficulty.    Time 3    Period Weeks      OT SHORT TERM GOAL #3   Title Patient will increase bilateral hand grip strength by 5# and pinch strength by 2# in order to increase ability to open jars and containers that are not tight.    Time 3    Period Weeks      OT SHORT TERM GOAL #4   Title Patient will increase her left hand coordination by completing the 9 hole peg test in under 1 minute in order to participate in simple  housekeeping tasks with less difficulty.    Time 3    Period Weeks             OT Long Term Goals - 04/25/20 1136      OT LONG TERM GOAL #1   Title Patient will return to using both arms for all daily tasks while using her LUE for 75% of more of daily tasks.    Baseline 2/24: Patient report using LUE for 60%    Time 8    Period Weeks    Status On-going      OT LONG TERM GOAL #2   Title Patient will report a decrease in pain in BUE during functional tasks of approximately 3/10 or less.    Time 8    Period Weeks      OT LONG TERM GOAL #3   Title Patient will increase BUE shoulder, wrist, and hand A/ROM to WNL or as close as possible in order to decrease difficulty level during dressing and functional reaching tasks.    Time 8    Period Weeks    Status Partially Met      OT LONG TERM GOAL #4   Title Patient will increase grip strength in the right hand to 45#, left hand to 10#, and left pinch strength to 5# in order to increase ability to manipulate and open jars and containers without needing assistance.    Baseline Update 12/1:Patient has met goal for grip and pinch. Goal updated to continue progress in the therapy. See goal #9.    Time 8    Period Weeks      OT LONG TERM GOAL #5   Title Patient will decrease the bilateral UE fascial restrictions to min amount or less in order to increase her functional mobility needed to complete desire housekeeping tasks.    Time 8    Period Weeks      OT LONG TERM GOAL #6   Title Patient will increase her left hand coordination by completing the 9 hole peg test in 45" or better to allow her to manipulate small items at home such  as buttons, zippers, etc.    Baseline Update 12/1: Patient has met goal for coordination. Goal updated to continue progress. See goal #8    Time 8    Period Weeks      OT LONG TERM GOAL #7   Title Patient will improve BUE strength to at least a 4-/5 in order to promote patient's ability to complete daily  self-care tasks with less difficulty.    Time 8    Period Weeks      OT LONG TERM GOAL #8   Title Patient will increase her left hand coordination by completing the 9 hole peg test in 30" or better to allow her to manipulate small items at home such as buttons, zippers, etc.    Baseline 2/24: 30.7"    Time 6    Period Weeks    Status Achieved      OT LONG TERM GOAL  #9   TITLE Patient will increase grip strength in the left hand to 25#, and left pinch strength to 10# in order to increase ability to manipulate and open jars and containers without needing assistance.    Baseline 2/24: left grip strength: 25# left pinch lateral: 8#, lateral pinch: 8#    Time 6    Period Weeks    Status Partially Met                 Plan - 04/25/20 1324    Clinical Impression Statement A: Reassessment completed this date. patient has met all short term goals at this point and 6/9 long term goals with 2 additional goals met. Patient is able to demonstrate functional right shoulder A/ROM, left wrist A/ROM and fine motor coordination. She has met the grip strength portion of her long term goal and is 2lbs away from meeting her pinch strength long term goal. She reports is is using her LUE as her nondominant for daily tasks about 60% of the time versus the goal of 75%. Discharge was discussed with patient as she has been doing well over all during therapy sessions. Education has been provided regarding appropriate HEP and progressions have been provided. Therapist did recommend discharge although patient would like OT to check on her one more time before discharging completely. Pt agreed to completing HEP at home for the next 4 weeks then return for a reassessment.    Body Structure / Function / Physical Skills ADL;Fascial restriction;UE functional use;Pain;FMC;ROM;GMC;Coordination;Decreased knowledge of precautions    OT Frequency Other (comment)   1 more treatment session at the end of 4 weeks.   OT Duration  Other (comment)   see above   Plan P: Reassess at next visit. Discharge if appropriate with HEP.    OT Home Exercise Plan eval: table slides (flexion, abduction), 10/4: AA/ROM wrist and elbow (flexion, extension) 11/8: AA/ROM supine and seated 11/15: A/ROM shoulder exercises.12/1: yellow putty (left hand) and red putty (right hand) for grip and pinch. 12/23: passive composite digit flexion, finger opposition, finger adduction/abduction, wrist extension A/ROM; 2/1 :drawn out instructions for A/AROM wrist extension 2/24: Shoulder stretch: flexion and abduction    Consulted and Agree with Plan of Care Patient           Patient will benefit from skilled therapeutic intervention in order to improve the following deficits and impairments:   Body Structure / Function / Physical Skills: ADL,Fascial restriction,UE functional use,Pain,FMC,ROM,GMC,Coordination,Decreased knowledge of precautions       Visit Diagnosis: Other lack of coordination - Plan: Ot plan  of care cert/re-cert  Stiffness of left shoulder, not elsewhere classified - Plan: Ot plan of care cert/re-cert  Other symptoms and signs involving the musculoskeletal system - Plan: Ot plan of care cert/re-cert  Acute pain of left shoulder - Plan: Ot plan of care cert/re-cert  Stiffness of right shoulder, not elsewhere classified - Plan: Ot plan of care cert/re-cert    Problem List Patient Active Problem List   Diagnosis Date Noted  . Primary hypertension 01/23/2020  . Need for immunization against influenza 01/23/2020  . Encounter to establish care 01/19/2020  . Acute blood loss anemia 09/04/2019  . Drainage from wound 09/04/2019  . 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 Neuropathy 09/04/2019  . Neurogenic bowel 09/04/2019  . Central cord syndrome ([place]) 08/07/2019  . Thoracic spine fracture [facility]) 08/03/2019    Ailene Ravel, OTR/L,CBIS  [phone]  04/25/2020, 2:28 PM  [place] [address] [place], [place], [address] Phone: [phone]   Fax:  [phone]  Name: Shelly Houston MRN: [id number] Date of Birth: 19-Oct-1970

## 2020-04-30 DIAGNOSIS — Z419 Encounter for procedure for purposes other than remedying health state, unspecified: Secondary | ICD-10-CM | POA: Diagnosis not present

## 2020-05-17 ENCOUNTER — Encounter: Payer: Medicaid Other | Admitting: Internal Medicine

## 2020-05-23 ENCOUNTER — Encounter (HOSPITAL_COMMUNITY): Payer: Self-pay

## 2020-05-23 ENCOUNTER — Other Ambulatory Visit: Payer: Self-pay

## 2020-05-23 ENCOUNTER — Ambulatory Visit (HOSPITAL_COMMUNITY): Payer: Medicaid Other | Attending: Physical Medicine and Rehabilitation

## 2020-05-23 DIAGNOSIS — R29898 Other symptoms and signs involving the musculoskeletal system: Secondary | ICD-10-CM | POA: Diagnosis not present

## 2020-05-23 DIAGNOSIS — R278 Other lack of coordination: Secondary | ICD-10-CM

## 2020-05-23 DIAGNOSIS — M25612 Stiffness of left shoulder, not elsewhere classified: Secondary | ICD-10-CM | POA: Diagnosis not present

## 2020-05-23 DIAGNOSIS — M25611 Stiffness of right shoulder, not elsewhere classified: Secondary | ICD-10-CM | POA: Diagnosis not present

## 2020-05-23 DIAGNOSIS — M25512 Pain in left shoulder: Secondary | ICD-10-CM | POA: Insufficient documentation

## 2020-05-23 NOTE — Therapy (Signed)
[place], [place], [address] Phone: [phone]   Fax:  [phone]  Occupational Therapy Treatment Reassessment and discharge   Patient Details  Name: LARIA GRIMMETT MRN: [id number] Date of Birth: 1970-10-18 Referring Provider (OT): Dr. Posey Pronto   Encounter Date: 05/23/2020   OT End of Session - 05/23/20 0932    Visit Number 29    Number of Visits 30    Authorization Type [employer]    Authorization Time Period 12 visist approved (02/12/20-07/27/20)    Authorization - Visit Number 11    Authorization - Number of Visits 12    OT Start Time 0825   reassess and discharge   OT Stop Time 0903    OT Time Calculation (min) 38 min    Activity Tolerance Patient tolerated treatment well    Behavior During Therapy [facility] for tasks assessed/performed           Past Medical History:  Diagnosis Date  . Central cord syndrome ([place])   . Primary hypertension 01/23/2020    Past Surgical History:  Procedure Laterality Date  . ABDOMINAL HYSTERECTOMY    . head surgery  2021    There were no vitals filed for this visit.   Subjective Assessment - 05/23/20 0847    Subjective  S: I have been working on everything.    Patient is accompanied by: Interpreter    Currently in Pain? No/denies              [facility] OT Assessment - 05/23/20 0830      Assessment   Medical Diagnosis Central cord syndrome      Precautions   Precautions None      ROM / Strength   AROM / PROM / Strength AROM;Strength      AROM   Right Shoulder Flexion 145 Degrees   previous: 146   Right Shoulder ABduction 150 Degrees   previous: 141   Left Shoulder Flexion 101 Degrees   previous: 100   Left Shoulder ABduction 108 Degrees   preivous: 100     Strength   Strength Assessment Site Hand    Left Hand Grip (lbs) 30   previous: 25   Left Hand Lateral Pinch 8 lbs   previous; same   Left Hand 3 Point Pinch 8 lbs   previous: same                    OT Treatments/Exercises (OP) - 05/23/20 0848      Exercises   Exercises Hand;Theraputty;Neck      Neck Exercises: Stretches   Upper Trapezius Stretch Right;Left;1 rep;20 seconds    Neck Stretch 1 rep;20 seconds   right, left rotation   Lower Cervical/Upper Thoracic Stretch 1 rep;20 seconds   right, left     Theraputty   Theraputty - Roll green    Theraputty - Pinch green - lateral and 3 point                  OT Education - 05/23/20 0850    Education Details provided green theraputty and green theraband to upgrade HEP at home for grip and pinch strengthening and scapular strengthening. Continue with shoulder flexion and abduction stretch. Added cervical rotation right and left and lateral flexion right and left stretches    Person(s) Educated Patient    Methods Demonstration;Explanation;Verbal cues    Comprehension Returned demonstration;Verbalized understanding  OT Short Term Goals - 05/23/20 0829      OT SHORT TERM GOAL #1   Title Patient will be educated and independent with HEP in order to increase functional use of both arms while completing basic self care tasks.    Time 3    Period Weeks    Target Date 12/19/19      OT SHORT TERM GOAL #2   Title Patient will increase her BUE P/ROM to WFL in order to increase ability to complete upper body dressing tasks with less difficulty.    Time 3    Period Weeks      OT SHORT TERM GOAL #3   Title Patient will increase bilateral hand grip strength by 5# and pinch strength by 2# in order to increase ability to open jars and containers that are not tight.    Time 3    Period Weeks      OT SHORT TERM GOAL #4   Title Patient will increase her left hand coordination by completing the 9 hole peg test in under 1 minute in order to participate in simple housekeeping tasks with less difficulty.    Time 3    Period Weeks             OT Long Term Goals - 05/23/20 0829      OT LONG TERM  GOAL #1   Title Patient will return to using both arms for all daily tasks while using her LUE for 75% of more of daily tasks.    Time 8    Period Weeks    Status Achieved      OT LONG TERM GOAL #2   Title Patient will report a decrease in pain in BUE during functional tasks of approximately 3/10 or less.    Time 8    Period Weeks      OT LONG TERM GOAL #3   Title Patient will increase BUE shoulder, wrist, and hand A/ROM to WNL or as close as possible in order to decrease difficulty level during dressing and functional reaching tasks.    Time 8    Period Weeks    Status Partially Met      OT LONG TERM GOAL #4   Title Patient will increase grip strength in the right hand to 45#, left hand to 10#, and left pinch strength to 5# in order to increase ability to manipulate and open jars and containers without needing assistance.    Baseline Update 12/1:Patient has met goal for grip and pinch. Goal updated to continue progress in the therapy. See goal #9.    Time 8    Period Weeks      OT LONG TERM GOAL #5   Title Patient will decrease the bilateral UE fascial restrictions to min amount or less in order to increase her functional mobility needed to complete desire housekeeping tasks.    Time 8    Period Weeks      OT LONG TERM GOAL #6   Title Patient will increase her left hand coordination by completing the 9 hole peg test in 45" or better to allow her to manipulate small items at home such as buttons, zippers, etc.    Baseline Update 12/1: Patient has met goal for coordination. Goal updated to continue progress. See goal #8    Time 8    Period Weeks      OT LONG TERM GOAL #7   Title Patient will improve BUE strength to   at least a 4-/5 in order to promote patient's ability to complete daily self-care tasks with less difficulty.    Time 8    Period Weeks      OT LONG TERM GOAL #8   Title Patient will increase her left hand coordination by completing the 9 hole peg test in 30" or  better to allow her to manipulate small items at home such as buttons, zippers, etc.    Baseline 2/24: 30.7"    Time 6    Period Weeks    Status --      OT LONG TERM GOAL  #9   TITLE Patient will increase grip strength in the left hand to 25#, and left pinch strength to 10# in order to increase ability to manipulate and open jars and containers without needing assistance.    Baseline 2/24: left grip strength: 25# left pinch lateral: 8#, lateral pinch: 8#    Time 6    Period Weeks    Status Partially Met                 Plan - 05/23/20 0934    Clinical Impression Statement A: Reassessment completed this date. Assessed A/ROM of BUE shoulder flexion and abduction, left hand grip and pinch strength. Patient remained the same with all shoulder measurements except right shoulder abduction which she increased. Left hand grip strength did increase by 5lbs. Pinch strength for lateral and 3 point remained the same. Overall, patient has met all short term goals and 7/9 LTGs with remaining 2 goals partially met. patient does demonstrated limited LUE A/ROM although it is functional. She was provided with cervical stretches per her request. Theraputty was upgraded to green for HEP as well as a green theraband for scapular strengthening. All education has been completed. Patient agrees with discharge.    Body Structure / Function / Physical Skills ADL;Fascial restriction;UE functional use;Pain;FMC;ROM;GMC;Coordination;Decreased knowledge of precautions    Plan P: discharge from OT services with HEP.    OT Home Exercise Plan eval: table slides (flexion, abduction), 10/4: AA/ROM wrist and elbow (flexion, extension) 11/8: AA/ROM supine and seated 11/15: A/ROM shoulder exercises.12/1: yellow putty (left hand) and red putty (right hand) for grip and pinch. 12/23: passive composite digit flexion, finger opposition, finger adduction/abduction, wrist extension A/ROM; 2/1 :drawn out instructions for A/AROM wrist  extension 2/24: Shoulder stretch: flexion and abduction 3/24: cervical stretches. green theraband and green putty upgrade.    Consulted and Agree with Plan of Care Patient           Patient will benefit from skilled therapeutic intervention in order to improve the following deficits and impairments:   Body Structure / Function / Physical Skills: ADL,Fascial restriction,UE functional use,Pain,FMC,ROM,GMC,Coordination,Decreased knowledge of precautions       Visit Diagnosis: Other lack of coordination  Stiffness of left shoulder, not elsewhere classified  Other symptoms and signs involving the musculoskeletal system  Acute pain of left shoulder  Stiffness of right shoulder, not elsewhere classified    Problem List Patient Active Problem List   Diagnosis Date Noted  . Primary hypertension 01/23/2020  . Need for immunization against influenza 01/23/2020  . Encounter to establish care 01/19/2020  . Acute blood loss anemia 09/04/2019  . Drainage from wound 09/04/2019  . 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 Neuropathy 09/04/2019  . Neurogenic bowel 09/04/2019  . Central cord syndrome (HCC) 08/07/2019  . Thoracic spine fracture (HCC) 08/03/2019   OCCUPATIONAL THERAPY DISCHARGE SUMMARY  Visits from Start   of Care: 29  Current functional level related to goals / functional outcomes: See above   Remaining deficits: See above   Education / Equipment: Cervical, shoulder ROM HEP, shoulder stretches, hand strengthening HEP (green putty), scapular strengthening (green band) Plan: Patient agrees to discharge.  Patient goals were met. Patient is being discharged due to meeting the stated rehab goals.  ?????          Ailene Ravel, OTR/L,CBIS  [phone]  05/23/2020, 10:57 AM  [place], [place], [address] Phone: [phone]   Fax:  [phone]  Name: GRISEL BLUMENSTOCK MRN:  [id number] Date of Birth: 1970/06/02

## 2020-05-23 NOTE — Patient Instructions (Signed)
Access Code: [email or web address] URL: [email or web address] Date: 05/23/2020 Prepared by: Limmie Patricia  Exercises Seated Cervical Sidebending Stretch - 1-2 x daily - 7 x weekly - 1 sets - 20-30 hold Seated Levator Scapulae Stretch - 1-2 x daily - 7 x weekly - 1 sets - 20-30 hold Standing Cervical Rotation Stretch - 1-2 x daily - 7 x weekly - 2 sets - 20-30 hold

## 2020-05-31 DIAGNOSIS — Z419 Encounter for procedure for purposes other than remedying health state, unspecified: Secondary | ICD-10-CM | POA: Diagnosis not present

## 2020-06-05 ENCOUNTER — Ambulatory Visit (INDEPENDENT_AMBULATORY_CARE_PROVIDER_SITE_OTHER): Payer: Medicaid Other | Admitting: Internal Medicine

## 2020-06-05 ENCOUNTER — Other Ambulatory Visit: Payer: Self-pay

## 2020-06-05 ENCOUNTER — Encounter: Payer: Self-pay | Admitting: Internal Medicine

## 2020-06-05 VITALS — BP 142/93 | HR 66 | Resp 18 | Ht 62.0 in | Wt 151.8 lb

## 2020-06-05 DIAGNOSIS — I10 Essential (primary) hypertension: Secondary | ICD-10-CM | POA: Diagnosis not present

## 2020-06-05 DIAGNOSIS — Z0000 Encounter for general adult medical examination without abnormal findings: Secondary | ICD-10-CM | POA: Insufficient documentation

## 2020-06-05 DIAGNOSIS — G629 Polyneuropathy, unspecified: Secondary | ICD-10-CM

## 2020-06-05 DIAGNOSIS — Z1159 Encounter for screening for other viral diseases: Secondary | ICD-10-CM

## 2020-06-05 DIAGNOSIS — Z0001 Encounter for general adult medical examination with abnormal findings: Secondary | ICD-10-CM | POA: Diagnosis not present

## 2020-06-05 DIAGNOSIS — Z1231 Encounter for screening mammogram for malignant neoplasm of breast: Secondary | ICD-10-CM | POA: Diagnosis not present

## 2020-06-05 DIAGNOSIS — S22030D Wedge compression fracture of third thoracic vertebra, subsequent encounter for fracture with routine healing: Secondary | ICD-10-CM

## 2020-06-05 DIAGNOSIS — Z1211 Encounter for screening for malignant neoplasm of colon: Secondary | ICD-10-CM

## 2020-06-05 MED ORDER — GABAPENTIN 100 MG PO CAPS: 100.0000 mg | ORAL_CAPSULE | Freq: Every day | ORAL | 1 refills | Status: DC

## 2020-06-05 MED ORDER — LISINOPRIL-HYDROCHLOROTHIAZIDE 20-12.5 MG PO TABS: 1.0000 | ORAL_TABLET | Freq: Every day | ORAL | 1 refills | Status: DC
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
## 2020-06-05 NOTE — Assessment & Plan Note (Signed)
BP Readings from Last 1 Encounters:  06/05/20 (!) 142/93   Has not taken medication today Overall well-controlled with Losartan-HCTZ Counseled for compliance with the medications Advised DASH diet and moderate exercise/walking, at least 150 mins/week

## 2020-06-05 NOTE — Patient Instructions (Addendum)
Please continue taking BP medication as prescribed.  Start taking Gabapentin for neuropathic pain.  Please follow DASH diet and perform moderate exercise/walking at least 150 mins/week.   [email or web address]">  DASH Eating Plan DASH stands for Dietary Approaches to Stop Hypertension. The DASH eating plan is a healthy eating plan that has been shown to:  Reduce high blood pressure (hypertension).  Reduce your risk for type 2 diabetes, heart disease, and stroke.  Help with weight loss. What are tips for following this plan? Reading food labels  Check food labels for the amount of salt (sodium) per serving. Choose foods with less than 5 percent of the Daily Value of sodium. Generally, foods with less than 300 milligrams (mg) of sodium per serving fit into this eating plan.  To find whole grains, look for the word "whole" as the first word in the ingredient list. Shopping  Buy products labeled as "low-sodium" or "no salt added."  Buy fresh foods. Avoid canned foods and pre-made or frozen meals. Cooking  Avoid adding salt when cooking. Use salt-free seasonings or herbs instead of table salt or sea salt. Check with your health care provider or pharmacist before using salt substitutes.  Do not fry foods. Cook foods using healthy methods such as baking, boiling, grilling, roasting, and broiling instead.  Cook with heart-healthy oils, such as olive, canola, avocado, soybean, or sunflower oil. Meal planning  Eat a balanced diet that includes: ? 4 or more servings of fruits and 4 or more servings of vegetables each day. Try to fill one-half of your plate with fruits and vegetables. ? 6-8 servings of whole grains each day. ? Less than 6 oz (170 g) of lean meat, poultry, or fish each day. A 3-oz (85-g) serving of meat is about the same size as a deck of cards. One egg equals 1 oz (28 g). ? 2-3 servings of low-fat dairy each day. One serving is 1  cup (237 mL). ? 1 serving of nuts, seeds, or beans 5 times each week. ? 2-3 servings of heart-healthy fats. Healthy fats called omega-3 fatty acids are found in foods such as walnuts, flaxseeds, fortified milks, and eggs. These fats are also found in cold-water fish, such as sardines, salmon, and mackerel.  Limit how much you eat of: ? Canned or prepackaged foods. ? Food that is high in trans fat, such as some fried foods. ? Food that is high in saturated fat, such as fatty meat. ? Desserts and other sweets, sugary drinks, and other foods with added sugar. ? Full-fat dairy products.  Do not salt foods before eating.  Do not eat more than 4 egg yolks a week.  Try to eat at least 2 vegetarian meals a week.  Eat more home-cooked food and less restaurant, buffet, and fast food.   Lifestyle  When eating at a restaurant, ask that your food be prepared with less salt or no salt, if possible.  If you drink alcohol: ? Limit how much you use to:  0-1 drink a day for women who are not pregnant.  0-2 drinks a day for men. ? Be aware of how much alcohol is in your drink. In the [place] one drink equals one 12 oz bottle of beer (355 mL), one 5 oz glass of wine (148 mL), or one 1 oz glass of hard liquor (44 mL). General information  Avoid eating more than 2,300 mg of salt a day. If you have hypertension, you may need to reduce your sodium intake  to 1,500 mg a day.  Work with your health care provider to maintain a healthy body weight or to lose weight. Ask what an ideal weight is for you.  Get at least 30 minutes of exercise that causes your heart to beat faster (aerobic exercise) most days of the week. Activities may include walking, swimming, or biking.  Work with your health care provider or dietitian to adjust your eating plan to your individual calorie needs. What foods should I eat? Fruits All fresh, dried, or frozen fruit. Canned fruit in natural juice (without added  sugar). Vegetables Fresh or frozen vegetables (raw, steamed, roasted, or grilled). Low-sodium or reduced-sodium tomato and vegetable juice. Low-sodium or reduced-sodium tomato sauce and tomato paste. Low-sodium or reduced-sodium canned vegetables. Grains Whole-grain or whole-wheat bread. Whole-grain or whole-wheat pasta. Brown rice. Orpah Cobb. Bulgur. Whole-grain and low-sodium cereals. Pita bread. Low-fat, low-sodium crackers. Whole-wheat flour tortillas. Meats and other proteins Skinless chicken or [place]. Ground chicken or [place]. Pork with fat trimmed off. Fish and seafood. Egg whites. Dried beans, peas, or lentils. Unsalted nuts, nut butters, and seeds. Unsalted canned beans. Lean cuts of beef with fat trimmed off. Low-sodium, lean precooked or cured meat, such as sausages or meat loaves. Dairy Low-fat (1%) or fat-free (skim) milk. Reduced-fat, low-fat, or fat-free cheeses. Nonfat, low-sodium ricotta or cottage cheese. Low-fat or nonfat yogurt. Low-fat, low-sodium cheese. Fats and oils Soft margarine without trans fats. Vegetable oil. Reduced-fat, low-fat, or light mayonnaise and salad dressings (reduced-sodium). Canola, safflower, olive, avocado, soybean, and sunflower oils. Avocado. Seasonings and condiments Herbs. Spices. Seasoning mixes without salt. Other foods Unsalted popcorn and pretzels. Fat-free sweets. The items listed above may not be a complete list of foods and beverages you can eat. Contact a dietitian for more information. What foods should I avoid? Fruits Canned fruit in a light or heavy syrup. Fried fruit. Fruit in cream or butter sauce. Vegetables Creamed or fried vegetables. Vegetables in a cheese sauce. Regular canned vegetables (not low-sodium or reduced-sodium). Regular canned tomato sauce and paste (not low-sodium or reduced-sodium). Regular tomato and vegetable juice (not low-sodium or reduced-sodium). Rosita Fire. Olives. Grains Baked goods made with fat, such  as croissants, muffins, or some breads. Dry pasta or rice meal packs. Meats and other proteins Fatty cuts of meat. Ribs. Fried meat. Tomasa Blase. Bologna, salami, and other precooked or cured meats, such as sausages or meat loaves. Fat from the back of a pig (fatback). Bratwurst. Salted nuts and seeds. Canned beans with added salt. Canned or smoked fish. Whole eggs or egg yolks. Chicken or [place] with skin. Dairy Whole or 2% milk, cream, and half-and-half. Whole or full-fat cream cheese. Whole-fat or sweetened yogurt. Full-fat cheese. Nondairy creamers. Whipped toppings. Processed cheese and cheese spreads. Fats and oils Butter. Stick margarine. Lard. Shortening. Ghee. Bacon fat. Tropical oils, such as coconut, palm kernel, or palm oil. Seasonings and condiments Onion salt, garlic salt, seasoned salt, table salt, and sea salt. Worcestershire sauce. Tartar sauce. Barbecue sauce. Teriyaki sauce. Soy sauce, including reduced-sodium. Steak sauce. Canned and packaged gravies. Fish sauce. Oyster sauce. Cocktail sauce. Store-bought horseradish. Ketchup. Mustard. Meat flavorings and tenderizers. Bouillon cubes. Hot sauces. Pre-made or packaged marinades. Pre-made or packaged taco seasonings. Relishes. Regular salad dressings. Other foods Salted popcorn and pretzels. The items listed above may not be a complete list of foods and beverages you should avoid. Contact a dietitian for more information. Where to find more information  National Heart, Lung, and Blood Institute: PopSteam.is  American Heart Association:  www.heart.org  Academy of Nutrition and Dietetics: www.eatright.org  National Kidney Foundation: www.kidney.org Summary  The DASH eating plan is a healthy eating plan that has been shown to reduce high blood pressure (hypertension). It may also reduce your risk for type 2 diabetes, heart disease, and stroke.  When on the DASH eating plan, aim to eat more fresh fruits and vegetables, whole  grains, lean proteins, low-fat dairy, and heart-healthy fats.  With the DASH eating plan, you should limit salt (sodium) intake to 2,300 mg a day. If you have hypertension, you may need to reduce your sodium intake to 1,500 mg a day.  Work with your health care provider or dietitian to adjust your eating plan to your individual calorie needs. This information is not intended to replace advice given to you by your health care provider. Make sure you discuss any questions you have with your health care provider. Document Revised: 01/20/2019 Document Reviewed: 01/20/2019 Elsevier Patient Education  2021 [employer].
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
## 2020-06-05 NOTE — Assessment & Plan Note (Signed)
Annual exam as documented. Counseling done  re healthy lifestyle involving commitment to 150 minutes exercise per week, heart healthy diet, and attaining healthy weight.The importance of adequate sleep also discussed. Changes in health habits are decided on by the patient with goals and time frames  set for achieving them. Immunization and cancer screening needs are specifically addressed at this visit. 

## 2020-06-05 NOTE — Assessment & Plan Note (Signed)
Cervical neck pain with tingling intermittently Started Gabapentin

## 2020-06-06 LAB — CBC
Hematocrit: 46 % (ref 34.0–46.6)
Hemoglobin: 16.1 g/dL — ABNORMAL HIGH (ref 11.1–15.9)
MCH: 31 pg (ref 26.6–33.0)
MCHC: 35 g/dL (ref 31.5–35.7)
MCV: 89 fL (ref 79–97)
Platelets: 237 10*3/uL (ref 150–450)
RBC: 5.2 x10E6/uL (ref 3.77–5.28)
RDW: 12.7 % (ref 11.7–15.4)
WBC: 5.5 10*3/uL (ref 3.4–10.8)

## 2020-06-06 LAB — CMP14+EGFR
ALT: 48 IU/L — ABNORMAL HIGH (ref 0–32)
AST: 33 IU/L (ref 0–40)
Albumin/Globulin Ratio: 1.5 (ref 1.2–2.2)
Albumin: 4.5 g/dL (ref 3.8–4.9)
Alkaline Phosphatase: 119 IU/L (ref 44–121)
BUN/Creatinine Ratio: 18 (ref 9–23)
BUN: 10 mg/dL (ref 6–24)
Bilirubin Total: 0.3 mg/dL (ref 0.0–1.2)
CO2: 20 mmol/L (ref 20–29)
Calcium: 9.5 mg/dL (ref 8.7–10.2)
Chloride: 104 mmol/L (ref 96–106)
Creatinine, Ser: 0.56 mg/dL — ABNORMAL LOW (ref 0.57–1.00)
Globulin, Total: 3.1 g/dL (ref 1.5–4.5)
Glucose: 110 mg/dL — ABNORMAL HIGH (ref 65–99)
Potassium: 4 mmol/L (ref 3.5–5.2)
Sodium: 142 mmol/L (ref 134–144)
Total Protein: 7.6 g/dL (ref 6.0–8.5)
eGFR: 110 mL/min/{1.73_m2} (ref >59)

## 2020-06-06 LAB — LIPID PANEL
Chol/HDL Ratio: 4.5 ratio — ABNORMAL HIGH (ref 0.0–4.4)
Cholesterol, Total: 211 mg/dL — ABNORMAL HIGH (ref 100–199)
HDL: 47 mg/dL (ref >39)
LDL Chol Calc (NIH): 142 mg/dL — ABNORMAL HIGH (ref 0–99)
Triglycerides: 121 mg/dL (ref 0–149)
VLDL Cholesterol Cal: 22 mg/dL (ref 5–40)

## 2020-06-06 LAB — VITAMIN D 25 HYDROXY (VIT D DEFICIENCY, FRACTURES): Vit D, 25-Hydroxy: 31.2 ng/mL (ref 30.0–100.0)

## 2020-06-06 LAB — HEPATITIS C ANTIBODY: Hep C Virus Ab: <0.1 s/co ratio (ref 0.0–0.9)

## 2020-06-06 LAB — TSH: TSH: 1.76 u[IU]/mL (ref 0.450–4.500)

## 2020-06-12 LAB — COLOGUARD: Cologuard: NEGATIVE

## 2020-06-19 ENCOUNTER — Telehealth: Payer: Self-pay

## 2020-06-19 NOTE — Telephone Encounter (Signed)
Tammy from [employer]. Tammy received patient sample but the patient did not leave any demographics or date of birth. Needs to know time and date when sample was taken also. Please give Tammy a call at [phone].
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
## 2020-06-20 NOTE — Telephone Encounter (Signed)
Cologuard notified sample collected 06-10-20 at 8am

## 2020-06-26 ENCOUNTER — Encounter: Payer: Self-pay | Admitting: *Deleted

## 2020-06-26 LAB — COLOGUARD: COLOGUARD: NEGATIVE

## 2020-06-26 LAB — EXTERNAL GENERIC LAB PROCEDURE: COLOGUARD: NEGATIVE

## 2020-06-30 DIAGNOSIS — Z419 Encounter for procedure for purposes other than remedying health state, unspecified: Secondary | ICD-10-CM | POA: Diagnosis not present

## 2020-07-05 ENCOUNTER — Ambulatory Visit (HOSPITAL_COMMUNITY)
Admission: RE | Admit: 2020-07-05 | Discharge: 2020-07-05 | Disposition: A | Discharge status: Home / Self Care | Payer: Medicaid Other | Source: Ambulatory Visit | Attending: Internal Medicine | Admitting: Internal Medicine

## 2020-07-05 ENCOUNTER — Other Ambulatory Visit: Payer: Self-pay

## 2020-07-05 DIAGNOSIS — Z1231 Encounter for screening mammogram for malignant neoplasm of breast: Secondary | ICD-10-CM | POA: Insufficient documentation

## 2020-07-09 ENCOUNTER — Ambulatory Visit
Admission: EM | Admit: 2020-07-09 | Discharge: 2020-07-09 | Disposition: A | Discharge status: Home / Self Care | Payer: Medicaid Other | Attending: Family Medicine | Admitting: Family Medicine

## 2020-07-09 ENCOUNTER — Other Ambulatory Visit: Payer: Self-pay

## 2020-07-09 DIAGNOSIS — L237 Allergic contact dermatitis due to plants, except food: Secondary | ICD-10-CM | POA: Diagnosis not present

## 2020-07-09 MED ORDER — TRIAMCINOLONE ACETONIDE 0.1 % EX CREA: 1.0000 "application " | TOPICAL_CREAM | Freq: Two times a day (BID) | CUTANEOUS | 0 refills | Status: DC

## 2020-07-09 MED ORDER — DEXAMETHASONE SODIUM PHOSPHATE 10 MG/ML IJ SOLN
10.0000 mg | Freq: Once | INTRAMUSCULAR | Status: AC
  Administered 2020-07-09: 10 mg via INTRAMUSCULAR

## 2020-07-09 NOTE — Discharge Instructions (Addendum)
You received a steroid shot in the office today to help with your itching and rash  I have sent in triamcinolone cream for you to use to the area twice a day as needed.  Follow up with this office or with primary care if symptoms are persisting.  Follow up in the ER for high fever, trouble swallowing, trouble breathing, other concerning symptoms.

## 2020-07-09 NOTE — ED Provider Notes (Signed)
[facility]    CSN: [id number] Arrival date & time: 07/09/20  1858      History   Chief Complaint Chief Complaint  Patient presents with  . Rash    HPI Shelly Houston is a 50 y.o. female.   Reports red, raised, itchy rash to the face, neck, arms, legs for the last 3 days.  States that she was working outside last week.  States that she thinks that she got into poison ivy.  Has been using an Aveeno cream to the area with no relief.  States that the rash is starting to spread on her face and between her eyebrows and this is what she is most concerned about.  Denies drainage from the areas, fever, chills, abdominal pain, nausea, vomiting, shortness of breath, cough, other symptoms.  ROS per HPI   The history is provided by the patient.  Rash   Past Medical History:  Diagnosis Date  . Central cord syndrome (HCC)   . Primary hypertension 01/23/2020    Patient Active Problem List   Diagnosis Date Noted  . Encounter for annual physical exam 06/05/2020  . Primary hypertension 01/23/2020  . 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 Neuropathy 09/04/2019  . Neurogenic bowel 09/04/2019  . Central cord syndrome (HCC) 08/07/2019  . Wedge compression fracture of third thoracic vertebra, subsequent encounter for fracture with routine healing 08/03/2019    Past Surgical History:  Procedure Laterality Date  . ABDOMINAL HYSTERECTOMY    . head surgery  2021    OB History   No obstetric history on file.      Home Medications    Prior to Admission medications   Medication Sig Start Date End Date Taking? Authorizing Provider  triamcinolone cream (KENALOG) 0.1 % Apply 1 application topically 2 (two) times daily. 07/09/20  Yes Moshe Cipro, NP  gabapentin (NEURONTIN) 100 MG capsule Take 1 capsule (100 mg total) by mouth at bedtime. 06/05/20   Anabel Halon, MD  iron polysaccharides (NIFEREX) 150 MG capsule Take 1 capsule (150 mg total) by mouth daily  with breakfast. 01/08/20 04/07/20  Claiborne Rigg, NP  lisinopril-hydrochlorothiazide (ZESTORETIC) 20-12.5 MG tablet Take 1 tablet by mouth daily. 06/05/20   Anabel Halon, MD    Family History Family History  Problem Relation Age of Onset  . Diabetes Father   . Diabetes Sister     Social History Social History   Tobacco Use  . Smoking status: Never Smoker  . Smokeless tobacco: Never Used  Substance Use Topics  . Alcohol use: Not Currently  . Drug use: Never     Allergies   Patient has no known allergies.   Review of Systems Review of Systems  Skin: Positive for rash.     Physical Exam Triage Vital Signs ED Triage Vitals  Enc Vitals Group     BP 07/09/20 1909 (!) 143/83     Pulse Rate 07/09/20 1909 69     Resp 07/09/20 1909 18     Temp 07/09/20 1909 98 F (36.7 C)     Temp src --      SpO2 07/09/20 1909 98 %     Weight --      Height --      Head Circumference --      Peak Flow --      Pain Score 07/09/20 1908 0     Pain Loc --      Pain Edu? --  Excl. in GC? --    No data found.  Updated Vital Signs BP (!) 143/83   Pulse 69   Temp 98 F (36.7 C)   Resp 18   SpO2 98%      Physical Exam Vitals and nursing note reviewed.  Constitutional:      General: She is not in acute distress.    Appearance: Normal appearance. She is well-developed. She is not ill-appearing.  HENT:     Head: Normocephalic and atraumatic.     Nose: Nose normal.     Mouth/Throat:     Mouth: Mucous membranes are moist.     Pharynx: Oropharynx is clear.  Eyes:     Extraocular Movements: Extraocular movements intact.     Conjunctiva/sclera: Conjunctivae normal.     Pupils: Pupils are equal, round, and reactive to light.  Cardiovascular:     Rate and Rhythm: Normal rate.  Pulmonary:     Effort: Pulmonary effort is normal.  Musculoskeletal:        General: Normal range of motion.     Cervical back: Normal range of motion and neck supple.  Skin:    General: Skin is  warm and dry.     Capillary Refill: Capillary refill takes less than 2 seconds.     Findings: Rash present.     Comments: Erythematous, vesicular rash to bilateral arms, bilateral legs, to the right jaw and neck, to the face and between the eyebrows consistent with contact dermatitis  Neurological:     General: No focal deficit present.     Mental Status: She is alert and oriented to person, place, and time.  Psychiatric:        Mood and Affect: Mood normal.        Behavior: Behavior normal.        Thought Content: Thought content normal.      UC Treatments / Results  Labs (all labs ordered are listed, but only abnormal results are displayed) Labs Reviewed - No data to display  EKG   Radiology No results found.  Procedures Procedures (including critical care time)  Medications Ordered in UC Medications  dexamethasone (DECADRON) injection 10 mg (has no administration in time range)    Initial Impression / Assessment and Plan / UC Course  I have reviewed the triage vital signs and the nursing notes.  Pertinent labs & imaging results that were available during my care of the patient were reviewed by me and considered in my medical decision making (see chart for details).    Allergic contact dermatitis  Decadron 10 mg IM given in office today Triamcinolone cream prescribed You may use this twice a day as needed to affected areas Follow-up with this office or with primary care if symptoms are persisting Follow-up with the ER for trouble swallowing, trouble breathing, other concerning symptoms  Final Clinical Impressions(s) / UC Diagnoses   Final diagnoses:  Allergic contact dermatitis due to plants, except food     Discharge Instructions     You received a steroid shot in the office today to help with your itching and rash  I have sent in triamcinolone cream for you to use to the area twice a day as needed.  Follow up with this office or with primary care if  symptoms are persisting.  Follow up in the ER for high fever, trouble swallowing, trouble breathing, other concerning symptoms.     ED Prescriptions    Medication Sig Dispense Auth. Provider  triamcinolone cream (KENALOG) 0.1 % Apply 1 application topically 2 (two) times daily. 45 g Moshe Cipro, NP     PDMP not reviewed this encounter.   Moshe Cipro, NP 07/09/20 Ernest[place]

## 2020-07-09 NOTE — ED Triage Notes (Signed)
Pt presents with rash on face , arm and leg was wotking outside recently

## 2020-07-31 DIAGNOSIS — Z419 Encounter for procedure for purposes other than remedying health state, unspecified: Secondary | ICD-10-CM | POA: Diagnosis not present

## 2020-08-30 DIAGNOSIS — Z419 Encounter for procedure for purposes other than remedying health state, unspecified: Secondary | ICD-10-CM | POA: Diagnosis not present

## 2020-09-04 DIAGNOSIS — Z20822 Contact with and (suspected) exposure to covid-19: Secondary | ICD-10-CM | POA: Diagnosis not present

## 2020-09-30 DIAGNOSIS — Z419 Encounter for procedure for purposes other than remedying health state, unspecified: Secondary | ICD-10-CM | POA: Diagnosis not present

## 2020-10-31 DIAGNOSIS — Z419 Encounter for procedure for purposes other than remedying health state, unspecified: Secondary | ICD-10-CM | POA: Diagnosis not present

## 2020-11-30 DIAGNOSIS — Z419 Encounter for procedure for purposes other than remedying health state, unspecified: Secondary | ICD-10-CM | POA: Diagnosis not present

## 2020-12-05 ENCOUNTER — Other Ambulatory Visit: Payer: Self-pay

## 2020-12-05 ENCOUNTER — Encounter: Payer: Self-pay | Admitting: Internal Medicine

## 2020-12-05 ENCOUNTER — Ambulatory Visit (INDEPENDENT_AMBULATORY_CARE_PROVIDER_SITE_OTHER): Payer: Medicaid Other | Admitting: Internal Medicine

## 2020-12-05 ENCOUNTER — Encounter: Payer: Self-pay | Admitting: Neurology

## 2020-12-05 VITALS — BP 142/86 | HR 74 | Temp 97.7°F | Resp 18 | Ht 63.0 in | Wt 155.1 lb

## 2020-12-05 DIAGNOSIS — Z23 Encounter for immunization: Secondary | ICD-10-CM

## 2020-12-05 DIAGNOSIS — G629 Polyneuropathy, unspecified: Secondary | ICD-10-CM | POA: Diagnosis not present

## 2020-12-05 DIAGNOSIS — I10 Essential (primary) hypertension: Secondary | ICD-10-CM

## 2020-12-05 DIAGNOSIS — R29818 Other symptoms and signs involving the nervous system: Secondary | ICD-10-CM | POA: Insufficient documentation

## 2020-12-05 DIAGNOSIS — E782 Mixed hyperlipidemia: Secondary | ICD-10-CM | POA: Diagnosis not present

## 2020-12-05 MED ORDER — GABAPENTIN 100 MG PO CAPS: 100.0000 mg | ORAL_CAPSULE | Freq: Every day | ORAL | 1 refills | Status: DC

## 2020-12-05 MED ORDER — LISINOPRIL-HYDROCHLOROTHIAZIDE 20-12.5 MG PO TABS: 1.0000 | ORAL_TABLET | Freq: Every day | ORAL | 1 refills | Status: DC
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
## 2020-12-05 NOTE — Patient Instructions (Addendum)
Please continue to take medications as prescribed.  You are being referred to Neurology.  Please continue to follow low salt diet and perform moderate exercise/walking at least 150 mins/week.  Please start taking Womens' health multivitamin and Vitamin B12 1000 mcg once daily.

## 2020-12-05 NOTE — Assessment & Plan Note (Signed)
Cervical neck pain with tingling intermittently On Gabapentin, now better

## 2020-12-05 NOTE — Assessment & Plan Note (Signed)
Check lipid profile Advised to follow low-salt and low-cholesterol diet

## 2020-12-05 NOTE — Assessment & Plan Note (Signed)
Daughter reports that patient has been confused at times and forgets her activities Had an episode of ? Syncope No focal neurologic deficit currently Referred to neurology -could be seizure-like activity

## 2020-12-05 NOTE — Assessment & Plan Note (Signed)
BP Readings from Last 1 Encounters:  12/05/20 (!) 142/86   Has not taken medication today Overall well-controlled with Lisinopril-HCTZ Counseled for compliance with the medications Advised DASH diet and moderate exercise/walking, at least 150 mins/week

## 2020-12-06 LAB — LIPID PANEL
Chol/HDL Ratio: 4.4 ratio (ref 0.0–4.4)
Cholesterol, Total: 205 mg/dL — ABNORMAL HIGH (ref 100–199)
HDL: 47 mg/dL (ref >39)
LDL Chol Calc (NIH): 129 mg/dL — ABNORMAL HIGH (ref 0–99)
Triglycerides: 165 mg/dL — ABNORMAL HIGH (ref 0–149)
VLDL Cholesterol Cal: 29 mg/dL (ref 5–40)

## 2020-12-06 LAB — CMP14+EGFR
ALT: 68 IU/L — ABNORMAL HIGH (ref 0–32)
AST: 51 IU/L — ABNORMAL HIGH (ref 0–40)
Albumin/Globulin Ratio: 1.6 (ref 1.2–2.2)
Albumin: 4.5 g/dL (ref 3.8–4.9)
Alkaline Phosphatase: 128 IU/L — ABNORMAL HIGH (ref 44–121)
BUN/Creatinine Ratio: 22 (ref 9–23)
BUN: 11 mg/dL (ref 6–24)
Bilirubin Total: 0.7 mg/dL (ref 0.0–1.2)
CO2: 20 mmol/L (ref 20–29)
Calcium: 9.1 mg/dL (ref 8.7–10.2)
Chloride: 102 mmol/L (ref 96–106)
Creatinine, Ser: 0.51 mg/dL — ABNORMAL LOW (ref 0.57–1.00)
Globulin, Total: 2.8 g/dL (ref 1.5–4.5)
Glucose: 104 mg/dL — ABNORMAL HIGH (ref 70–99)
Potassium: 3.9 mmol/L (ref 3.5–5.2)
Sodium: 139 mmol/L (ref 134–144)
Total Protein: 7.3 g/dL (ref 6.0–8.5)
eGFR: 112 mL/min/{1.73_m2} (ref >59)

## 2020-12-06 LAB — TSH: TSH: 1.82 u[IU]/mL (ref 0.450–4.500)

## 2020-12-06 LAB — VITAMIN B12: Vitamin B-12: 1049 pg/mL (ref 232–1245)

## 2020-12-16 ENCOUNTER — Encounter: Payer: Self-pay | Admitting: Neurology

## 2020-12-16 ENCOUNTER — Ambulatory Visit: Payer: Medicaid Other | Admitting: Neurology

## 2020-12-16 ENCOUNTER — Other Ambulatory Visit: Payer: Self-pay

## 2020-12-16 VITALS — BP 107/69 | HR 68 | Wt 154.0 lb

## 2020-12-16 DIAGNOSIS — R404 Transient alteration of awareness: Secondary | ICD-10-CM
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
## 2020-12-16 NOTE — Patient Instructions (Addendum)
1. Programe la resonancia magntica cerebral con y sin contraste  2. Programe EEG de 1 hora. Si es normal programar EEG de 48 horas  3. Seguimiento despus de las pruebas, llame para cualquier cambio.

## 2020-12-30 ENCOUNTER — Ambulatory Visit: Payer: Medicaid Other | Admitting: Neurology

## 2020-12-30 ENCOUNTER — Other Ambulatory Visit: Payer: Self-pay

## 2020-12-30 DIAGNOSIS — R404 Transient alteration of awareness: Secondary | ICD-10-CM | POA: Diagnosis not present

## 2020-12-31 DIAGNOSIS — Z419 Encounter for procedure for purposes other than remedying health state, unspecified: Secondary | ICD-10-CM | POA: Diagnosis not present

## 2021-01-06 ENCOUNTER — Other Ambulatory Visit: Payer: Medicaid Other

## 2021-01-20 ENCOUNTER — Other Ambulatory Visit: Payer: Self-pay

## 2021-01-20 ENCOUNTER — Ambulatory Visit: Payer: Medicaid Other | Admitting: Neurology

## 2021-01-20 DIAGNOSIS — R404 Transient alteration of awareness: Secondary | ICD-10-CM | POA: Diagnosis not present
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
## 2021-01-27 ENCOUNTER — Ambulatory Visit
Admission: RE | Admit: 2021-01-27 | Discharge: 2021-01-27 | Disposition: A | Discharge status: Home / Self Care | Payer: Medicaid Other | Source: Ambulatory Visit | Attending: Neurology | Admitting: Neurology

## 2021-01-27 ENCOUNTER — Telehealth: Payer: Self-pay

## 2021-01-27 DIAGNOSIS — R569 Unspecified convulsions: Secondary | ICD-10-CM | POA: Diagnosis not present

## 2021-01-27 DIAGNOSIS — Q283 Other malformations of cerebral vessels: Secondary | ICD-10-CM | POA: Diagnosis not present

## 2021-01-27 DIAGNOSIS — R413 Other amnesia: Secondary | ICD-10-CM | POA: Diagnosis not present

## 2021-01-27 DIAGNOSIS — J341 Cyst and mucocele of nose and nasal sinus: Secondary | ICD-10-CM | POA: Diagnosis not present

## 2021-01-27 MED ORDER — GADOBENATE DIMEGLUMINE 529 MG/ML IV SOLN
14.0000 mL | Freq: Once | INTRAVENOUS | Status: AC | PRN
  Administered 2021-01-27: 14 mL via INTRAVENOUS

## 2021-01-27 NOTE — Telephone Encounter (Signed)
-----   Message from Glendale Chard, DO sent at 01/27/2021  1:27 PM EST ----- Please let pt know MRI brain is normal. Thanks.

## 2021-01-27 NOTE — Telephone Encounter (Signed)
Spoke with pt son informed him the MRI of the brain was normal

## 2021-01-30 DIAGNOSIS — Z419 Encounter for procedure for purposes other than remedying health state, unspecified: Secondary | ICD-10-CM | POA: Diagnosis not present

## 2021-02-10 ENCOUNTER — Ambulatory Visit: Payer: Medicaid Other | Admitting: Neurology

## 2021-02-18 ENCOUNTER — Telehealth: Payer: Self-pay | Admitting: Neurology

## 2021-02-18 MED ORDER — LEVETIRACETAM 500 MG PO TABS: 500.0000 mg | ORAL_TABLET | Freq: Two times a day (BID) | ORAL | 6 refills | Status: DC

## 2021-02-18 NOTE — Telephone Encounter (Signed)
Discussed EEG results with son Celene Skeen, EEG showed bilateral temporal epileptiform discharges, L>R. Recommendation to start seizure medication, side effects of Levetiracetam 500mg  BID discussed. is not aware of any seizures in the past month. F/u as scheduled in 05/2021, call for any changes.

## 2021-02-18 NOTE — Procedures (Signed)
ELECTROENCEPHALOGRAM REPORT  Dates of Recording: 01/20/2021 8:07AM to 01/22/2021 8:10AM  Patient's Name: Shelly Houston MRN: [id number] Date of Birth: 11-Dec-1970  Referring Provider: Dr. Patrcia Dolly  Procedure: 48-hour ambulatory video EEG  History: This is a 50 year old woman with recurrent episodes of staring/unresponsiveness with hand automatisms. She is amnestic of events. EEG for classification.  Medications:  NEURONTIN 100 MG capsule ZESTORETIC 20-12.5 MG tablet NIFEREX 150 MG capsule KENALOG 0.1 %  Technical Summary: This is a 48-hour multichannel digital video EEG recording measured by the international 10-20 system with electrodes applied with paste and impedances below 5000 ohms performed as portable with EKG monitoring.  The digital EEG was referentially recorded, reformatted, and digitally filtered in a variety of bipolar and referential montages for optimal display.    DESCRIPTION OF RECORDING: During maximal wakefulness, the background activity consisted of a symmetric 10 Hz posterior dominant rhythm which was reactive to eye opening. There is occasional focal 4-5 Hz theta slowing over the left temporal region.  There were no epileptiform discharges seen in wakefulness.   During the recording, the patient progresses through wakefulness, drowsiness, and Stage 2 sleep. Focal slowing over the left temporal region is again seen. In drowsiness and sleep, there are frequent sharp waves seen over the left frontotemporal region. There are occasional independent sharp waves seen over the right frontotemporal region.   Events: There were no push button events.   There were no electrographic seizures seen.  EKG lead was unremarkable.  IMPRESSION: This 48-hour ambulatory video EEG study is abnormal due to the presence of: Occasional focal slowing over the left temporal region Frequent left frontotemporal epileptiform discharges seen exclusively in sleep Occasional independent  right frontotemporal epileptiform discharges also seen exclusively in sleep  CLINICAL CORRELATION of the above findings indicates focal cerebral dysfunction over the left temporal region suggestive of underlying structural or physiologic abnormality. There is a tendency for seizures to arise from the bilateral temporal regions. Clinical correlation is advised.    Patrcia Dolly, M.D.

## 2021-03-02 DIAGNOSIS — Z419 Encounter for procedure for purposes other than remedying health state, unspecified: Secondary | ICD-10-CM | POA: Diagnosis not present

## 2021-04-02 DIAGNOSIS — Z419 Encounter for procedure for purposes other than remedying health state, unspecified: Secondary | ICD-10-CM | POA: Diagnosis not present

## 2021-04-30 DIAGNOSIS — Z419 Encounter for procedure for purposes other than remedying health state, unspecified: Secondary | ICD-10-CM | POA: Diagnosis not present

## 2021-05-31 DIAGNOSIS — Z419 Encounter for procedure for purposes other than remedying health state, unspecified: Secondary | ICD-10-CM | POA: Diagnosis not present

## 2021-06-06 ENCOUNTER — Encounter: Payer: Medicaid Other | Admitting: Internal Medicine

## 2021-06-23 ENCOUNTER — Encounter: Payer: Self-pay | Admitting: Neurology

## 2021-06-23 ENCOUNTER — Ambulatory Visit: Payer: Medicaid Other | Admitting: Neurology

## 2021-06-23 VITALS — BP 133/80 | HR 63 | Ht 61.0 in | Wt 154.0 lb

## 2021-06-23 DIAGNOSIS — G40009 Localization-related (focal) (partial) idiopathic epilepsy and epileptic syndromes with seizures of localized onset, not intractable, without status epilepticus: Secondary | ICD-10-CM | POA: Diagnosis not present

## 2021-06-23 MED ORDER — LEVETIRACETAM 500 MG PO TABS: 500.0000 mg | ORAL_TABLET | Freq: Two times a day (BID) | ORAL | 11 refills | Status: DC

## 2021-06-23 MED ORDER — OXCARBAZEPINE 300 MG PO TABS: 300.0000 mg | ORAL_TABLET | Freq: Two times a day (BID) | ORAL | 11 refills | Status: DC
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
## 2021-06-23 NOTE — Patient Instructions (Addendum)
?Que bueno verte. ? ?1. Comience Trileptal (oxcarbazepina) 300 mg: tome 1 tableta dos veces al d?a ? ?2. Contin?e con Keppra (Levetiracetam) 500 mg: Tome 1 [employer] al d?a ? ?3. Llevar un calendario de las incautaciones ? ?4. Seguimiento en 3 meses, llame para cualquier cambio ? ? ?Precauciones para las convulsiones: ?1. Si le han recetado medicamentos para prevenir las convulsiones, t?melos exactamente como se indica. No deje de tomar el medicamento sin hablar primero con su m?dico, incluso si no ha tenido una convulsi?n en [employer]. ? ?2. Evite actividades en las que una convulsi?n pueda causarle peligro a usted mismo oa otros. No opere maquinaria peligrosa, nade solo ni suba a lugares altos o peligrosos, como escaleras, techos o vigas. No conduzca a menos que su m?dico le diga que puede [place]. ? ?3. Si tiene [place] advertencia de que puede tener una convulsi?n, acu?stese en un lugar seguro donde no pueda lastimarse. ? ?4. No conducir durante 6 meses desde la ?ltima incautaci?n, seg?n la ley estatal de [place]. Consulte el siguiente enlace en el sitio web [place] para obtener m?s informaci?n: http://www.epilepsyfoundation.org/answerplace/Social/driving/drivingu.cfm ? ?5. Mantenga una buena higiene del sue?o. Evite el alcohol. ? ?6. P?ngase en contacto con su m?dico si tiene alg?n problema que pueda estar relacionado con el medicamento que est? tomando. ? ?7. Llame al 911 y lleve al paciente de vuelta al servicio de urgencias si: ?      ?A. La convulsi?n dura m?s de 5 minutos. ?B. El paciente no se despierta poco despu?s de la convulsi?n. ?C. El paciente tiene problemas nuevos, como dificultad para ver, hablar o moverse. ?D. El paciente result? herido durante la convulsi?n. ?E. El paciente tiene una temperatura superior a 102 F (39 C) ?F. El paciente vomit? y Shelly Houston tiene problemas para respirar ? ? ? ? ?Good to see you. ? ?Start Trileptal (oxcarbazepine)  300mg : take 1 tablet twice a day ? ?2. Continue Keppra (Levetiracetam) 500mg : Take 1 tablet twice a day ? ?3. Keep a calendar of the seizures ? ?4. Follow-up in 3 months, call for any changes ? ? ?Seizure Precautions: ?1. If medication has been prescribed for you to prevent seizures, take it exactly as directed.  Do not stop taking the medicine without talking to your doctor first, even if you have not had a seizure in a long time.  ? ?2. Avoid activities in which a seizure would cause danger to yourself or to others.  Don't operate dangerous machinery, swim alone, or climb in high or dangerous places, such as on ladders, roofs, or girders.  Do not drive unless your doctor says you may. ? ?3. If you have any warning that you may have a seizure, lay down in a safe place where you can't hurt yourself.   ? ?4.  No driving for 6 months from last seizure, as per [facility].   Please refer to the following link on the [place] website for more information: http://www.epilepsyfoundation.org/answerplace/Social/driving/drivingu.cfm  ? ?5.  Maintain good sleep hygiene. Avoid alcohol. ? ?6.  Contact your doctor if you have any problems that may be related to the medicine you are taking. ? ?7.  Call 911 and bring the patient back to the ED if: ?      ? A.  The seizure lasts longer than 5 minutes.      ? B.  The patient doesn't awaken shortly after the seizure ? C.  The patient has new problems such as difficulty seeing, speaking or moving ? D.  The patient was injured during the seizure ? E.  The patient has a temperature over 102 F (39C) ? F.  The patient vomited and now is having trouble breathing ?      ? ?

## 2021-06-30 DIAGNOSIS — Z419 Encounter for procedure for purposes other than remedying health state, unspecified: Secondary | ICD-10-CM | POA: Diagnosis not present

## 2021-07-31 DIAGNOSIS — Z419 Encounter for procedure for purposes other than remedying health state, unspecified: Secondary | ICD-10-CM | POA: Diagnosis not present

## 2021-08-30 DIAGNOSIS — Z419 Encounter for procedure for purposes other than remedying health state, unspecified: Secondary | ICD-10-CM | POA: Diagnosis not present

## 2021-09-16 ENCOUNTER — Encounter: Payer: Medicaid Other | Admitting: Internal Medicine

## 2021-09-17 ENCOUNTER — Encounter: Payer: Self-pay | Admitting: Neurology

## 2021-09-17 ENCOUNTER — Encounter: Payer: Self-pay | Admitting: Internal Medicine

## 2021-09-17 ENCOUNTER — Ambulatory Visit: Payer: Medicaid Other | Admitting: Neurology

## 2021-09-17 VITALS — BP 128/75 | HR 73 | Ht 61.0 in | Wt 154.0 lb

## 2021-09-17 DIAGNOSIS — G40009 Localization-related (focal) (partial) idiopathic epilepsy and epileptic syndromes with seizures of localized onset, not intractable, without status epilepticus: Secondary | ICD-10-CM | POA: Diagnosis not present
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
## 2021-09-17 NOTE — Patient Instructions (Signed)
Que bueno verte.  1. Aumente la oxcarbazepina 300 mg: tome 2 tabletas dos veces al da  2. Tomar el Levetiracetam 500mg  dos veces al da por 2 semanas ms. Si no hay efectos secundarios con la dosis ms alta de oxcarbazepina, puede comenzar a reducir el levetiracetam a 1 tableta todas las noches durante 1 semana, luego suspenda el levetiracetam.  3. Seguimiento en 3 meses, llame para cualquier cambio.   Precauciones para las convulsiones: 1. Si le han recetado medicamentos para prevenir las convulsiones, tmelos exactamente como se indica. No deje de tomar el medicamento sin hablar primero con su mdico, incluso si no ha tenido .  2. Evite actividades en las que una convulsin pueda causarle peligro a usted mismo oa otros. No opere maquinaria peligrosa, nade solo ni suba a lugares altos o peligrosos, como escaleras, techos o vigas. No conduzca a menos que su mdico le diga que puede [place].  3. Si tiene [place] advertencia de que puede tener una convulsin, acustese en un lugar seguro donde no pueda lastimarse.  4. No conducir durante 6 meses desde la ltima incautacin, segn la ley estatal de [place]. Consulte el siguiente enlace en el sitio web de la [employer]ile[employer] Foundation of [place] para obtener ms informacin: http://www.epilepsyfoundation.org/answerplace/Social/driving/drivingu.cfm  5. Mantenga una buena higiene del sueo.  6. Pngase en contacto con su mdico si tiene algn problema que pueda estar relacionado con el medicamento que est tomando.  7. Llame al 911 y lleve al paciente de vuelta al servicio de urgencias si:       A. La convulsin dura ms de 5 minutos. B. El paciente no se despierta poco despus de la convulsin. C. El paciente tiene problemas nuevos, como dificultad para ver, hablar o moverse. D. El paciente result herido durante la convulsin. E. El paciente tiene [place] superior a 102 F (39 C) F. El paciente vomit  y ahora tiene problemas para respirar     Good to see you.  Increase the oxcarbazepine 300mg : Take 2 tablets twice a day  2. Take the Levetiracetam 500mg  twice a day for 2 more weeks. If no side effects on the higher dose of oxcarbazepine, can start reducing Levetiracetam to 1 tablet every night for 1 weeks, then stop Levetiracetam.  3. Follow-up in 3 months, call for any changes.   Seizure Precautions: 1. If medication has been prescribed for you to prevent seizures, take it exactly as directed.  Do not stop taking the medicine without talking to your doctor first, even if you have not had a seizure in a long time.   2. Avoid activities in which a seizure would cause danger to yourself or to others.  Don't operate dangerous machinery, swim alone, or climb in high or dangerous places, such as on ladders, roofs, or girders.  Do not drive unless your doctor says you may.  3. If you have any warning that you may have a seizure, lay down in a safe place where you can't hurt yourself.    4.  No driving for 6 months from last seizure, as per [facility].   Please refer to the following link on the Epilepsy Foundation of [employer]'s website for more information: http://www.epilepsyfoundation.org/answerplace/Social/driving/drivingu.cfm   5.  Maintain good sleep hygiene.   6.  Contact your doctor if you have any problems that may be related to the medicine you are taking.  7.  Call 911 and bring the patient back to the  ED if:        A.  The seizure lasts longer than 5 minutes.       B.  The patient doesn't awaken shortly after the seizure  C.  The patient has new problems such as difficulty seeing, speaking or moving  D.  The patient was injured during the seizure  E.  The patient has a temperature over 102 F (39C)  F.  The patient vomited and now is having trouble breathing

## 2021-09-18 ENCOUNTER — Other Ambulatory Visit: Payer: Self-pay | Admitting: Neurology

## 2021-09-30 DIAGNOSIS — Z419 Encounter for procedure for purposes other than remedying health state, unspecified: Secondary | ICD-10-CM | POA: Diagnosis not present

## 2021-10-31 DIAGNOSIS — Z419 Encounter for procedure for purposes other than remedying health state, unspecified: Secondary | ICD-10-CM | POA: Diagnosis not present

## 2021-11-24 ENCOUNTER — Encounter: Payer: Self-pay | Admitting: *Deleted

## 2021-11-30 DIAGNOSIS — Z419 Encounter for procedure for purposes other than remedying health state, unspecified: Secondary | ICD-10-CM | POA: Diagnosis not present

## 2021-12-26 ENCOUNTER — Ambulatory Visit: Payer: Medicaid Other | Admitting: Neurology

## 2021-12-26 ENCOUNTER — Encounter: Payer: Self-pay | Admitting: Neurology

## 2021-12-26 VITALS — BP 126/76 | HR 71 | Resp 20 | Ht 63.0 in | Wt 155.0 lb

## 2021-12-26 DIAGNOSIS — G40009 Localization-related (focal) (partial) idiopathic epilepsy and epileptic syndromes with seizures of localized onset, not intractable, without status epilepticus: Secondary | ICD-10-CM

## 2021-12-26 MED ORDER — OXCARBAZEPINE 300 MG PO TABS: ORAL_TABLET | ORAL | 11 refills | Status: DC
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
## 2021-12-26 NOTE — Patient Instructions (Signed)
Good to see you.  Increase oxcarbazepine (Trileptal) 300mg : take 2 tablets in morning, 2 tablets in evening  2. Stop the Levetiracetam (Keppra)  3. Keep a calendar of the seizures  4. Follow-up in 3 months, call for any changes   Seizure Precautions: 1. If medication has been prescribed for you to prevent seizures, take it exactly as directed.  Do not stop taking the medicine without talking to your doctor first, even if you have not had a seizure in a long time.   2. Avoid activities in which a seizure would cause danger to yourself or to others.  Don't operate dangerous machinery, swim alone, or climb in high or dangerous places, such as on ladders, roofs, or girders.  Do not drive unless your doctor says you may.  3. If you have any warning that you may have a seizure, lay down in a safe place where you can't hurt yourself.    4.  No driving for 6 months from last seizure, as per [facility].   Please refer to the following link on the [place] website for more information: http://www.epilepsyfoundation.org/answerplace/Social/driving/drivingu.cfm   5.  Maintain good sleep hygiene.  6.  Contact your doctor if you have any problems that may be related to the medicine you are taking.  7.  Call 911 and bring the patient back to the ED if:        A.  The seizure lasts longer than 5 minutes.       B.  The patient doesn't awaken shortly after the seizure  C.  The patient has new problems such as difficulty seeing, speaking or moving  D.  The patient was injured during the seizure  E.  The patient has a temperature over 102 F (39C)  F.  The patient vomited and now is having trouble breathing

## 2021-12-31 DIAGNOSIS — Z419 Encounter for procedure for purposes other than remedying health state, unspecified: Secondary | ICD-10-CM | POA: Diagnosis not present

## 2022-01-30 DIAGNOSIS — Z419 Encounter for procedure for purposes other than remedying health state, unspecified: Secondary | ICD-10-CM | POA: Diagnosis not present

## 2022-03-02 DIAGNOSIS — Z419 Encounter for procedure for purposes other than remedying health state, unspecified: Secondary | ICD-10-CM | POA: Diagnosis not present

## 2022-03-31 ENCOUNTER — Ambulatory Visit: Payer: Medicaid Other | Admitting: Neurology

## 2022-03-31 ENCOUNTER — Encounter: Payer: Self-pay | Admitting: Neurology

## 2022-04-02 DIAGNOSIS — Z419 Encounter for procedure for purposes other than remedying health state, unspecified: Secondary | ICD-10-CM | POA: Diagnosis not present

## 2022-05-01 DIAGNOSIS — Z419 Encounter for procedure for purposes other than remedying health state, unspecified: Secondary | ICD-10-CM | POA: Diagnosis not present

## 2022-06-01 DIAGNOSIS — Z419 Encounter for procedure for purposes other than remedying health state, unspecified: Secondary | ICD-10-CM | POA: Diagnosis not present

## 2022-07-01 DIAGNOSIS — Z419 Encounter for procedure for purposes other than remedying health state, unspecified: Secondary | ICD-10-CM | POA: Diagnosis not present

## 2022-07-08 IMAGING — MG MM DIGITAL SCREENING BILAT W/ TOMO AND CAD
8 of 13 series · 8 of 37 positions shown · non-contrast
Comparison: None.

CLINICAL DATA: Screening.

EXAM:
DIGITAL SCREENING BILATERAL MAMMOGRAM WITH TOMOSYNTHESIS AND CAD
TECHNIQUE: Bilateral screening digital craniocaudal and mediolateral oblique
mammograms were obtained. Bilateral screening digital breast
tomosynthesis was performed. The images were evaluated with
computer-aided detection.

[R CV]
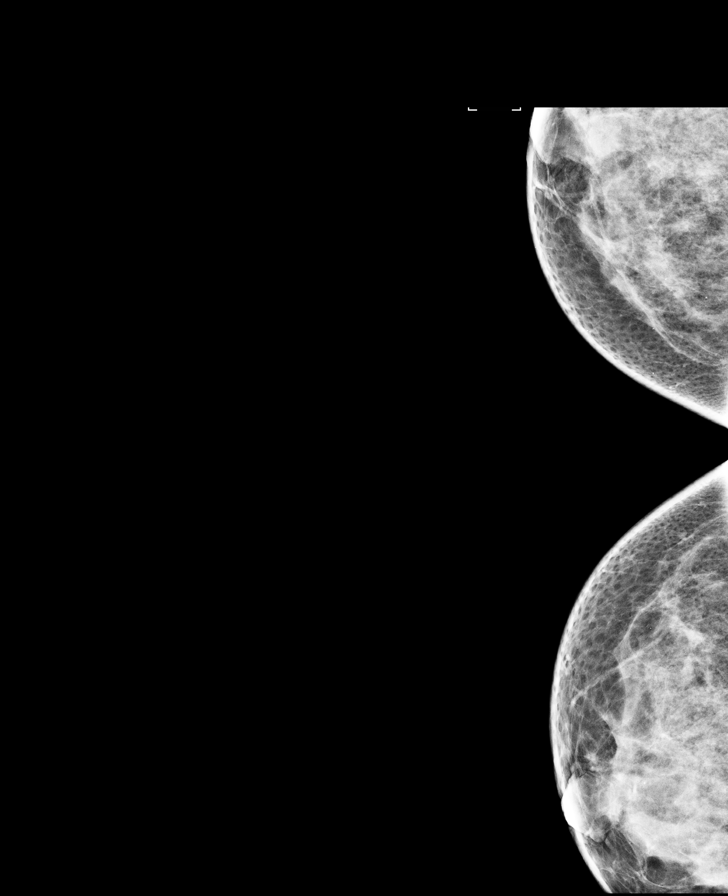

[R CC synth-2D (1 of 2)]
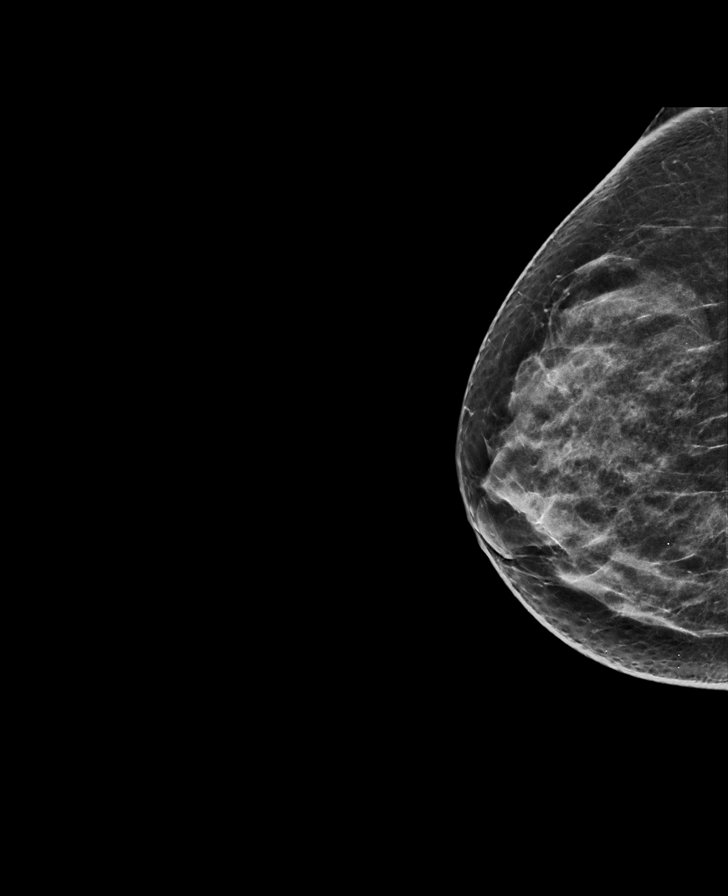

[L MLO synth-2D]
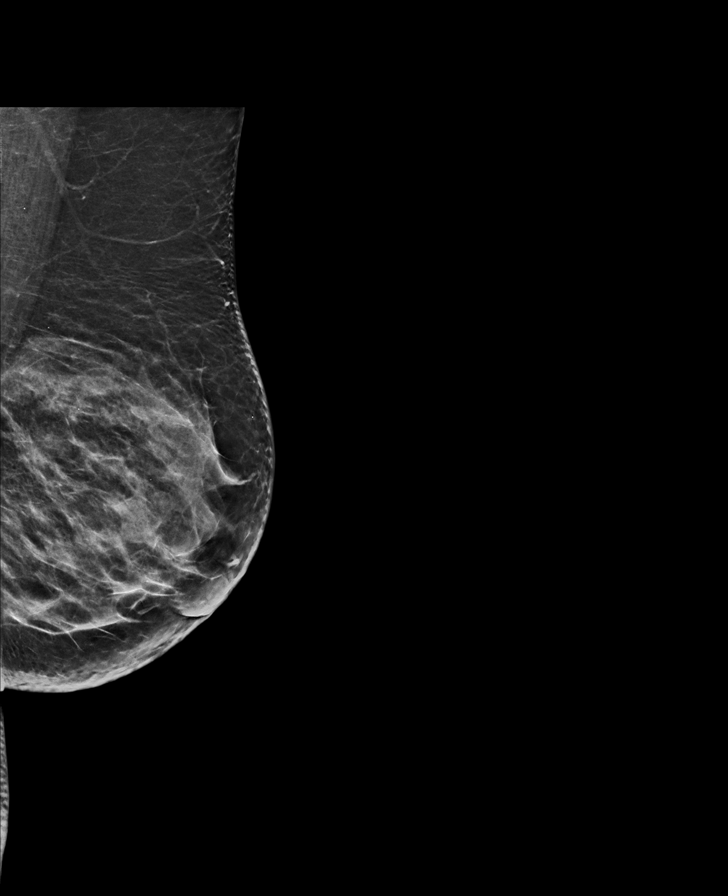

[R CC synth-2D (2 of 2)]
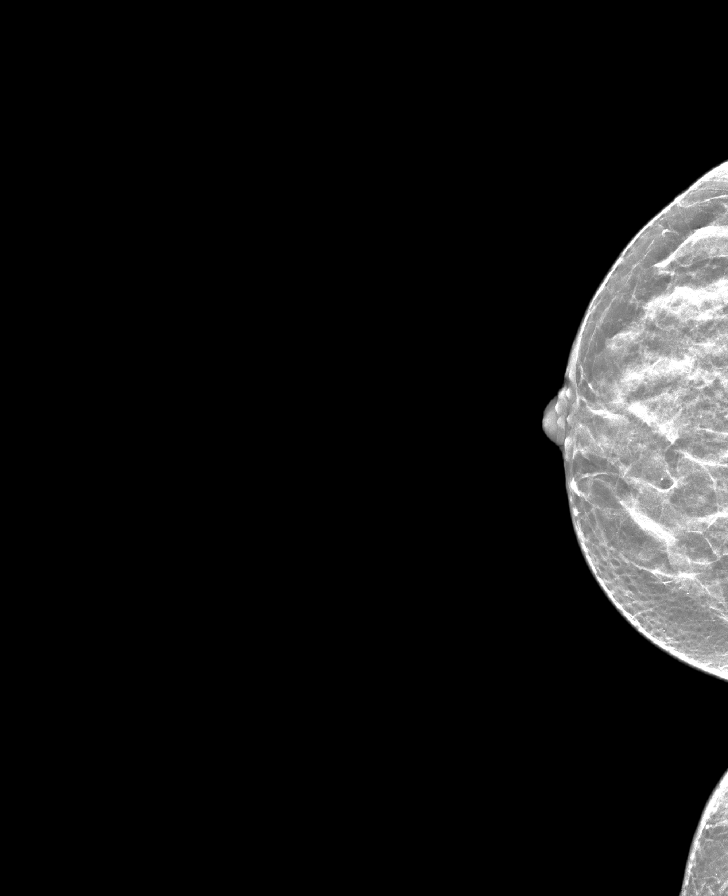

[L CC synth-2D (1 of 2)]
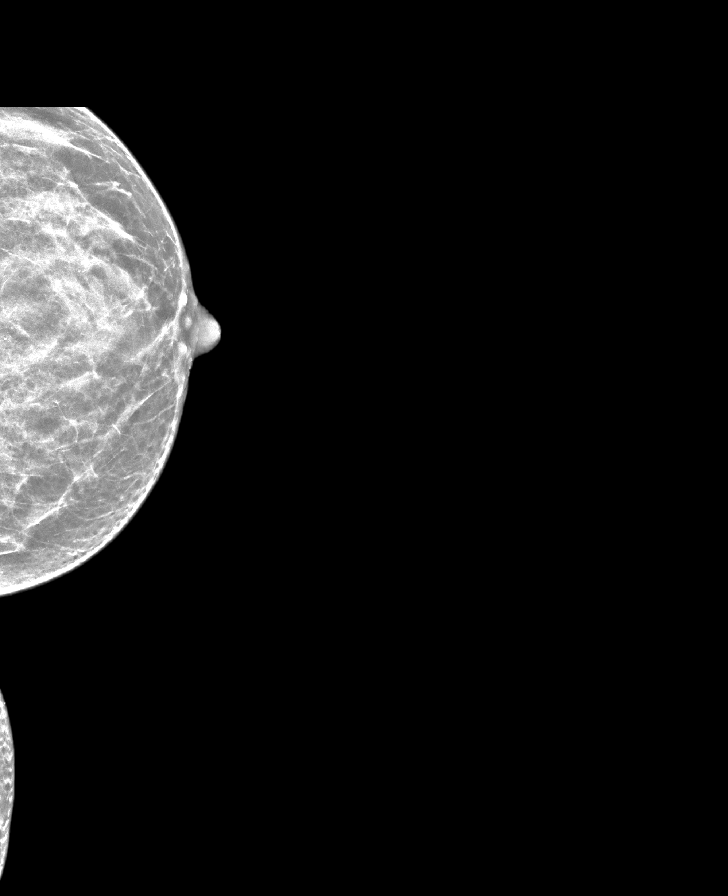

[L CC synth-2D (2 of 2)]
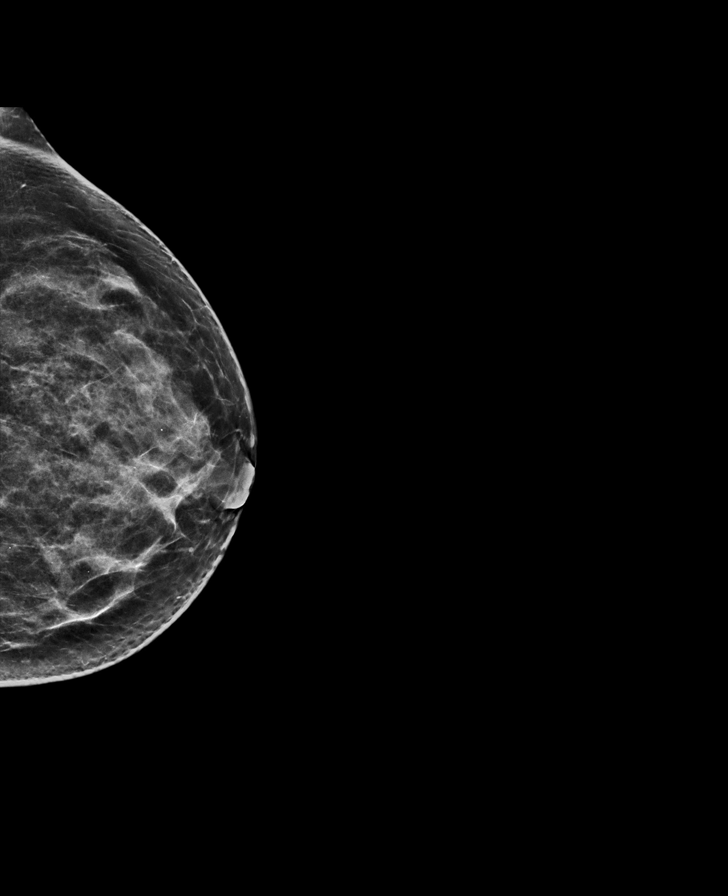

[R MLO synth-2D]
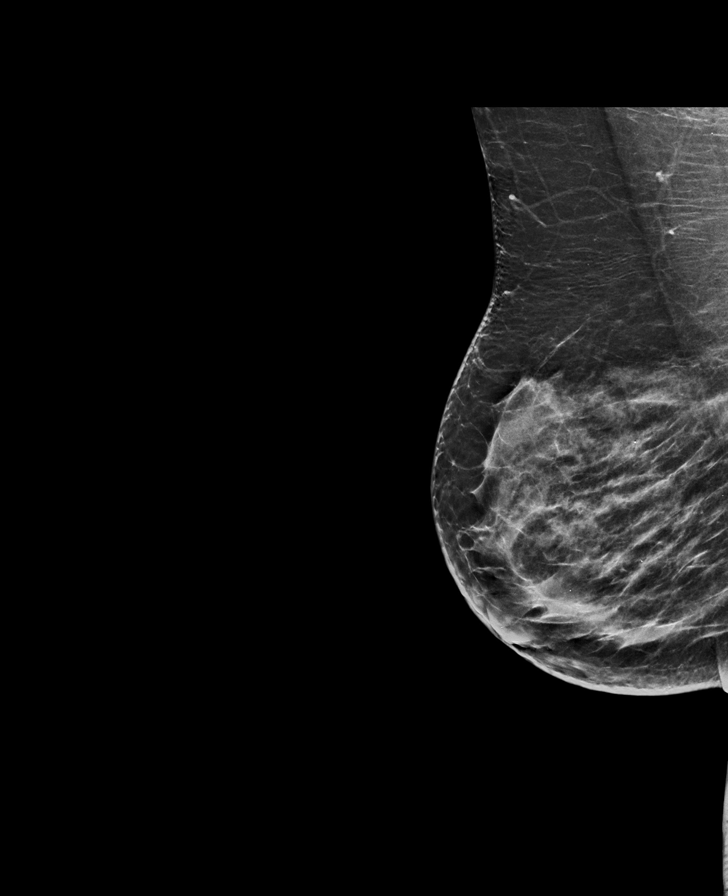

[R MLO tomo · tomo slice 33/66.0]
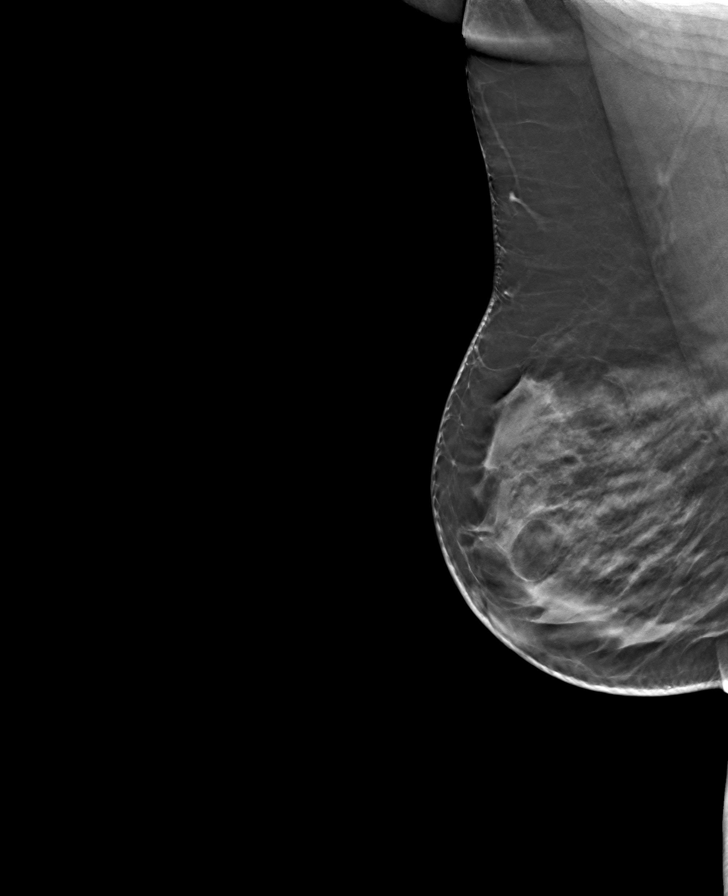

[8 of 37 positions shown; findings below may reference images not displayed]

ACR Breast Density Category c: The breast tissue is heterogeneously
dense, which may obscure small masses
FINDINGS: There are no findings suspicious for malignancy. The images were
evaluated with computer-aided detection.
IMPRESSION: No mammographic evidence of malignancy. A result letter of this
screening mammogram will be mailed directly to the patient.

RECOMMENDATION:
Screening mammogram in one year. (Code:CQ-[id number])

BI-RADS CATEGORY  1: Negative.

## 2022-08-01 DIAGNOSIS — Z419 Encounter for procedure for purposes other than remedying health state, unspecified: Secondary | ICD-10-CM | POA: Diagnosis not present

## 2022-08-27 ENCOUNTER — Ambulatory Visit: Payer: Medicaid Other | Admitting: Neurology

## 2022-08-27 ENCOUNTER — Encounter: Payer: Self-pay | Admitting: Neurology

## 2022-08-27 VITALS — BP 138/63 | HR 65 | Ht 62.0 in | Wt 153.4 lb

## 2022-08-27 DIAGNOSIS — G40009 Localization-related (focal) (partial) idiopathic epilepsy and epileptic syndromes with seizures of localized onset, not intractable, without status epilepticus: Secondary | ICD-10-CM

## 2022-08-27 MED ORDER — OXCARBAZEPINE 600 MG PO TABS: ORAL_TABLET | ORAL | 3 refills | Status: DC
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
## 2022-08-27 NOTE — Patient Instructions (Addendum)
Que bueno verte.  1. Aumentar la oxcarbazepina a 600 mg: tomar 1 comprimido y 1/2 dos veces al da  2. Mantenga un calendario de las convulsiones, haga un seguimiento en 3-4 meses, llame para cualquier cambio.   Precauciones ante las convulsiones: 1. Si le han recetado medicamentos para prevenir las convulsiones, tmelos exactamente como se le indica.  No deje de tomar el medicamento sin consultar primero con su mdico, incluso si no ha tenido Office manager.   2. Evite actividades en las que una convulsin pueda causar peligro para usted o para los dems.  No opere maquinaria peligrosa, nade solo ni trepe a lugares altos o peligrosos, como escaleras, techos o vigas.  No conduzca a menos que su mdico se lo indique.  3. Si tiene algn aviso de que puede tener una convulsin, acustese en un lugar seguro donde no pueda lastimarse.    4. No conducir durante 6 meses desde la ltima incautacin, segn la ley estatal de [place].   Consulte el siguiente enlace en el sitio web de la Epilepsy Foundation of [place] para obtener ms informacin: http://www.epilepsyfoundation.org/answerplace/Social/driving/drivingu.cfm   5. Mantenga una buena higiene del sueo. Evite el alcohol  6. Comunquese con su mdico si tiene algn problema que pueda estar relacionado con el medicamento que est tomando.  7. Llame al 911 y lleve al paciente de regreso al servicio de urgencias si:        R. La convulsin dura ms de 5 minutos.       B. El paciente no despierta poco despus de la convulsin.  C. El paciente tiene nuevos problemas como dificultad para ver, hablar o moverse.  D. El paciente result herido durante la convulsin.  E. El paciente tiene [place] superior a 102 F (39 C)  F. El paciente vomit y ahora tiene problemas para Industrial/product designer.    Good to see you.  Increase oxcarbazepine to 600mg : take 1 and 1/2 tablets twice a day  2. Keep a calendar of the seizures,  follow-up in 3-4 months, call for any changes   Seizure Precautions: 1. If medication has been prescribed for you to prevent seizures, take it exactly as directed.  Do not stop taking the medicine without talking to your doctor first, even if you have not had a seizure in a long time.   2. Avoid activities in which a seizure would cause danger to yourself or to others.  Don't operate dangerous machinery, swim alone, or climb in high or dangerous places, such as on ladders, roofs, or girders.  Do not drive unless your doctor says you may.  3. If you have any warning that you may have a seizure, lay down in a safe place where you can't hurt yourself.    4.  No driving for 6 months from last seizure, as per [facility].   Please refer to the following link on the Epilepsy Foundation [employer] [place]'s website for more information: http://www.epilepsyfoundation.org/answerplace/Social/driving/drivingu.cfm   5.  Maintain good sleep hygiene. Avoid alcohol  6.  Contact your doctor if you have any problems that may be related to the medicine you are taking.  7.  Call 911 and bring the patient back to the ED if:        A.  The seizure lasts longer than 5 minutes.       B.  The patient doesn't awaken shortly after the seizure  C.  The patient has new problems such as  difficulty seeing, speaking or moving  D.  The patient was injured during the seizure  E.  The patient has a temperature over 102 F (39C)  F.  The patient vomited and now is having trouble breathing

## 2022-08-31 DIAGNOSIS — Z419 Encounter for procedure for purposes other than remedying health state, unspecified: Secondary | ICD-10-CM | POA: Diagnosis not present

## 2022-10-01 DIAGNOSIS — Z419 Encounter for procedure for purposes other than remedying health state, unspecified: Secondary | ICD-10-CM | POA: Diagnosis not present

## 2022-10-23 ENCOUNTER — Ambulatory Visit: Payer: Medicaid Other | Admitting: Neurology

## 2022-11-01 DIAGNOSIS — Z419 Encounter for procedure for purposes other than remedying health state, unspecified: Secondary | ICD-10-CM | POA: Diagnosis not present

## 2022-11-16 ENCOUNTER — Telehealth: Payer: Self-pay

## 2022-11-16 NOTE — Telephone Encounter (Signed)
LVM for patient to call back [phone], or to call PCP office to schedule follow up apt. AS, CMA  

## 2022-11-25 ENCOUNTER — Ambulatory Visit: Payer: Medicaid Other | Admitting: Neurology

## 2022-11-25 ENCOUNTER — Encounter: Payer: Self-pay | Admitting: Neurology

## 2022-11-25 VITALS — BP 124/81 | HR 71 | Ht 61.0 in | Wt 156.0 lb

## 2022-11-25 DIAGNOSIS — G40009 Localization-related (focal) (partial) idiopathic epilepsy and epileptic syndromes with seizures of localized onset, not intractable, without status epilepticus: Secondary | ICD-10-CM | POA: Diagnosis not present

## 2022-11-25 MED ORDER — OXCARBAZEPINE 600 MG PO TABS: ORAL_TABLET | ORAL | 3 refills | Status: DC
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
## 2022-11-25 NOTE — Patient Instructions (Addendum)
Es bueno verte.  1. Aumente oxcarbazepina 600 mg: tome 2 comprimidos por la maana y 2 comprimidos por la tarde.  2. Mantenga un calendario de las convulsiones  3. Seguimiento en 3-4 meses, llame para cualquier cambio.   Precauciones ante las convulsiones: 1. Si le han recetado medicamentos para prevenir las convulsiones, tmelos exactamente como se le indica.  No deje de tomar el medicamento sin consultar primero con su mdico, incluso si no ha tenido Office manager.   2. Evite actividades en las que una convulsin pueda causar peligro para usted o para los dems.  No opere maquinaria peligrosa, nade solo ni trepe a lugares altos o peligrosos, como escaleras, techos o vigas.  No conduzca a menos que su mdico se lo indique.  3. Si tiene algn aviso de que puede sufrir una convulsin, acustese en un lugar seguro donde no pueda lastimarse.    4. No conducir durante 6 meses desde la ltima incautacin, segn la ley estatal de [place].   Consulte el siguiente enlace en el sitio web de la [employer]ile[employer] [place] para obtener ms informacin: http://www.epilepsyfoundation.org/answerplace/Social/driving/drivingu.cfm   5. Mantenga una buena higiene del sueo. Evite el alcohol.  6. Comunquese con su mdico si tiene algn problema que pueda estar relacionado con el medicamento que est tomando.  7. Llame al 911 y lleve al paciente de regreso al servicio de urgencias si:        R. La convulsin dura ms de 5 minutos.       B. El paciente no despierta poco despus de la convulsin.  C. El paciente tiene nuevos problemas como dificultad para ver, hablar o moverse.  D. El paciente result herido durante la convulsin.  E. El paciente tiene [place] superior a 102 F (39 C)  F. El paciente vomit y ahora tiene problemas para Industrial/product designer.   Good to see you.  Increase Oxcarbazepine 600mg : take 2 tablets in AM, 2 tablets in PM  2. Keep a calendar of the  seizures  3. Follow-up in 3-4 months, call for any changes   Seizure Precautions: 1. If medication has been prescribed for you to prevent seizures, take it exactly as directed.  Do not stop taking the medicine without talking to your doctor first, even if you have not had a seizure in a long time.   2. Avoid activities in which a seizure would cause danger to yourself or to others.  Don't operate dangerous machinery, swim alone, or climb in high or dangerous places, such as on ladders, roofs, or girders.  Do not drive unless your doctor says you may.  3. If you have any warning that you may have a seizure, lay down in a safe place where you can't hurt yourself.    4.  No driving for 6 months from last seizure, as per [facility].   Please refer to the following link on the Epile[employer] Foundation [employer] [place]'s website for more information: http://www.epilepsyfoundation.org/answerplace/Social/driving/drivingu.cfm   5.  Maintain good sleep hygiene. Avoid alcohol.  6.  Contact your doctor if you have any problems that may be related to the medicine you are taking.  7.  Call 911 and bring the patient back to the ED if:        A.  The seizure lasts longer than 5 minutes.       B.  The patient doesn't awaken shortly after the seizure  C.  The patient has new problems  such as difficulty seeing, speaking or moving  D.  The patient was injured during the seizure  E.  The patient has a temperature over 102 F (39C)  F.  The patient vomited and now is having trouble breathing

## 2022-12-01 DIAGNOSIS — Z419 Encounter for procedure for purposes other than remedying health state, unspecified: Secondary | ICD-10-CM | POA: Diagnosis not present

## 2023-01-01 DIAGNOSIS — Z419 Encounter for procedure for purposes other than remedying health state, unspecified: Secondary | ICD-10-CM | POA: Diagnosis not present

## 2023-01-31 DIAGNOSIS — Z419 Encounter for procedure for purposes other than remedying health state, unspecified: Secondary | ICD-10-CM | POA: Diagnosis not present

## 2023-03-01 ENCOUNTER — Ambulatory Visit: Payer: Medicaid Other | Admitting: Neurology

## 2023-03-01 ENCOUNTER — Encounter: Payer: Self-pay | Admitting: Neurology

## 2023-03-01 VITALS — BP 137/82 | HR 67 | Ht 63.0 in | Wt 154.4 lb

## 2023-03-01 DIAGNOSIS — S46912A Strain of unspecified muscle, fascia and tendon at shoulder and upper arm level, left arm, initial encounter: Secondary | ICD-10-CM

## 2023-03-01 DIAGNOSIS — G40009 Localization-related (focal) (partial) idiopathic epilepsy and epileptic syndromes with seizures of localized onset, not intractable, without status epilepticus: Secondary | ICD-10-CM

## 2023-03-01 MED ORDER — OXCARBAZEPINE 600 MG PO TABS: ORAL_TABLET | ORAL | 3 refills | Status: DC

## 2023-03-01 NOTE — Patient Instructions (Addendum)
Es bueno verte.  1. Continuar Oxcarbazepina 600 mg: tomar 1 comprimido y 1/2 dos veces al da  2. Se enviar derivacin a Fisioterapia para el hombro izquierdo.  3. Seguimiento en 4 meses, llamar para cualquier cambio.   Precauciones ante las convulsiones: 1. Si le han recetado medicamentos para prevenir las convulsiones, tmelos exactamente como se le indica.  No deje de tomar el medicamento sin consultar primero con su mdico, incluso si no ha tenido Office manager.   2. Evite actividades en las que una convulsin pueda causar peligro para usted o para los dems.  No opere maquinaria peligrosa, nade solo ni trepe a lugares altos o peligrosos, como escaleras, techos o vigas.  No conduzca a menos que su mdico se lo indique.  3. Si tiene algn aviso de que puede tener una convulsin, acustese en un lugar seguro donde no pueda lastimarse.    4. No conducir durante 6 meses desde la ltima incautacin, segn la ley estatal de [place].   Consulte el siguiente enlace en el sitio web de la [employer] [place] para obtener ms informacin: http://www.epilepsyfoundation.org/answerplace/Social/driving/drivingu.cfm   5. Mantenga una buena higiene del sueo. Evite el alcohol.  6. Comunquese con su mdico si tiene algn problema que pueda estar relacionado con el medicamento que est tomando.  7. Llame al 911 y lleve al paciente de regreso al servicio de urgencias si:        R. La convulsin dura ms de 5 minutos.       B. El paciente no despierta poco despus de la convulsin.  C. El paciente tiene nuevos problemas como dificultad para ver, hablar o moverse.  D. El paciente result herido durante la convulsin.  E. El paciente tiene [place] superior a 102 F (39 C)  F. El paciente vomit y ahora tiene problemas para Industrial/product designer.   Good to see you.  Continue Oxcarbazepine 600mg : take 1 and 1/2 tablets twice a day  2. Referral will be sent to  Physical Therapy for left shoulder  3. Follow-up in 4 months, call for any changes   Seizure Precautions: 1. If medication has been prescribed for you to prevent seizures, take it exactly as directed.  Do not stop taking the medicine without talking to your doctor first, even if you have not had a seizure in a long time.   2. Avoid activities in which a seizure would cause danger to yourself or to others.  Don't operate dangerous machinery, swim alone, or climb in high or dangerous places, such as on ladders, roofs, or girders.  Do not drive unless your doctor says you may.  3. If you have any warning that you may have a seizure, lay down in a safe place where you can't hurt yourself.    4.  No driving for 6 months from last seizure, as per [facility].   Please refer to the following link on the Epilepsy Foundation of [place]'s website for more information: http://www.epilepsyfoundation.org/answerplace/Social/driving/drivingu.cfm   5.  Maintain good sleep hygiene. Avoid alcohol.  6.  Contact your doctor if you have any problems that may be related to the medicine you are taking.  7.  Call 911 and bring the patient back to the ED if:        A.  The seizure lasts longer than 5 minutes.       B.  The patient doesn't awaken shortly after the seizure  C.  The patient  has new problems such as difficulty seeing, speaking or moving  D.  The patient was injured during the seizure  E.  The patient has a temperature over 102 F (39C)  F.  The patient vomited and now is having trouble breathing
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
## 2023-03-03 DIAGNOSIS — Z419 Encounter for procedure for purposes other than remedying health state, unspecified: Secondary | ICD-10-CM | POA: Diagnosis not present

## 2023-04-01 DIAGNOSIS — U071 COVID-19: Secondary | ICD-10-CM | POA: Diagnosis not present

## 2023-04-01 DIAGNOSIS — R509 Fever, unspecified: Secondary | ICD-10-CM | POA: Diagnosis not present

## 2023-04-03 DIAGNOSIS — Z419 Encounter for procedure for purposes other than remedying health state, unspecified: Secondary | ICD-10-CM | POA: Diagnosis not present

## 2023-05-01 DIAGNOSIS — Z419 Encounter for procedure for purposes other than remedying health state, unspecified: Secondary | ICD-10-CM | POA: Diagnosis not present

## 2023-06-12 DIAGNOSIS — Z419 Encounter for procedure for purposes other than remedying health state, unspecified: Secondary | ICD-10-CM | POA: Diagnosis not present

## 2023-07-02 ENCOUNTER — Other Ambulatory Visit: Payer: Self-pay

## 2023-07-02 ENCOUNTER — Telehealth: Payer: Self-pay | Admitting: Neurology

## 2023-07-02 ENCOUNTER — Encounter (HOSPITAL_COMMUNITY): Payer: Self-pay

## 2023-07-02 ENCOUNTER — Emergency Department (HOSPITAL_COMMUNITY)
Admission: EM | Admit: 2023-07-02 | Discharge: 2023-07-02 | Disposition: A | Discharge status: Home / Self Care | Attending: Emergency Medicine | Admitting: Emergency Medicine

## 2023-07-02 DIAGNOSIS — R569 Unspecified convulsions: Secondary | ICD-10-CM | POA: Insufficient documentation

## 2023-07-02 LAB — CBC WITH DIFFERENTIAL/PLATELET
Abs Immature Granulocytes: 0.01 10*3/uL (ref 0.00–0.07)
Basophils Absolute: 0 10*3/uL (ref 0.0–0.1)
Basophils Relative: 1 %
Eosinophils Absolute: 0.1 10*3/uL (ref 0.0–0.5)
Eosinophils Relative: 1 %
HCT: 47.1 % — ABNORMAL HIGH (ref 36.0–46.0)
Hemoglobin: 16.7 g/dL — ABNORMAL HIGH (ref 12.0–15.0)
Immature Granulocytes: 0 %
Lymphocytes Relative: 32 %
Lymphs Abs: 1.9 10*3/uL (ref 0.7–4.0)
MCH: 31.3 pg (ref 26.0–34.0)
MCHC: 35.5 g/dL (ref 30.0–36.0)
MCV: 88.4 fL (ref 80.0–100.0)
Monocytes Absolute: 0.5 10*3/uL (ref 0.1–1.0)
Monocytes Relative: 8 %
Neutro Abs: 3.5 10*3/uL (ref 1.7–7.7)
Neutrophils Relative %: 58 %
Platelets: 215 10*3/uL (ref 150–400)
RBC: 5.33 MIL/uL — ABNORMAL HIGH (ref 3.87–5.11)
RDW: 12.3 % (ref 11.5–15.5)
WBC: 5.9 10*3/uL (ref 4.0–10.5)
nRBC: 0 % (ref 0.0–0.2)

## 2023-07-02 LAB — COMPREHENSIVE METABOLIC PANEL WITH GFR
ALT: 45 U/L — ABNORMAL HIGH (ref 0–44)
AST: 34 U/L (ref 15–41)
Albumin: 5 g/dL (ref 3.5–5.0)
Alkaline Phosphatase: 110 U/L (ref 38–126)
Anion gap: 10 (ref 5–15)
BUN: 13 mg/dL (ref 6–20)
CO2: 26 mmol/L (ref 22–32)
Calcium: 9.7 mg/dL (ref 8.9–10.3)
Chloride: 99 mmol/L (ref 98–111)
Creatinine, Ser: 0.5 mg/dL (ref 0.44–1.00)
GFR, Estimated: >60 mL/min (ref >60)
Glucose, Bld: 134 mg/dL — ABNORMAL HIGH (ref 70–99)
Potassium: 3.3 mmol/L — ABNORMAL LOW (ref 3.5–5.1)
Sodium: 135 mmol/L (ref 135–145)
Total Bilirubin: 0.7 mg/dL (ref 0.0–1.2)
Total Protein: 8.4 g/dL — ABNORMAL HIGH (ref 6.5–8.1)

## 2023-07-02 LAB — CBG MONITORING, ED: Glucose-Capillary: 118 mg/dL — ABNORMAL HIGH (ref 70–99)

## 2023-07-02 MED ORDER — LEVETIRACETAM 500 MG PO TABS
1000.0000 mg | ORAL_TABLET | Freq: Once | ORAL | Status: AC
  Administered 2023-07-02: 1000 mg via ORAL
  Filled 2023-07-02: qty 2

## 2023-07-02 MED ORDER — LEVETIRACETAM 500 MG PO TABS: 500.0000 mg | ORAL_TABLET | Freq: Two times a day (BID) | ORAL | 0 refills | Status: DC

## 2023-07-02 NOTE — ED Triage Notes (Addendum)
 Pt's daughter stated that pt has been having an increase in focal seizures since Wednesday that are lasting a long time. Pt's family stated that her longest episode was 10-15 min. Pt is alert and oriented in triage. Pt currently taking oxcarbazepine 

## 2023-07-02 NOTE — Telephone Encounter (Signed)
 The patients daughter called to see about a sooner appt for her mother. Her appt is 5/9 but [facility] doesn't have anything. I did add to wait list. She said her mother is worrying her due to the increased seizures. Her mother watches her kids and its becoming a problem, she is not sure if she needs to increase her medication or change it. She is very concerned
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
## 2023-07-02 NOTE — ED Provider Triage Note (Signed)
 Emergency Medicine Provider Triage Evaluation Note  Shelly Houston , a 53 y.o. female past medical history of seizures was evaluated in triage.  Patient's son and daughter provide history as the patient is Hispanic speaking.  Family members report increased seizure activity x 2 days.  Daughter states she has been having at least 3 seizures per day lasting anywhere from 10 to 15 minutes per episode.  Last one earlier today.  Family denies any missed doses of her Trileptal .  Daughter states that she has been giving her mother two 600 mg tablets twice a day as this is what is dosed on her prescription bottle.  She contacted her neurology office earlier today and was advised to bring her mother to the emergency room for further evaluation.  Family is concerned that she has not been eating well.  Denies any recent injury, head trauma, fever or vomiting.  No known UTI symptoms.  Review of Systems  Positive: Seizures Negative: Fever, vomiting, recent injury  Physical Exam  BP (!) 159/92 (BP Location: Right Arm)   Pulse 69   Temp 98.3 F (36.8 C) (Oral)   Ht 5\' 1"  (1.549 m)   Wt 65.8 kg   SpO2 99%   BMI 27.40 kg/m  Gen:   Awake, no distress   Resp:  Normal effort  MSK:   Moves extremities without difficulty  Other:    Medical Decision Making  Medically screening exam initiated at 6:25 PM.  Appropriate orders placed.  Fayne Hoover was informed that the remainder of the evaluation will be completed by another provider, this initial triage assessment does not replace that evaluation, and the importance of remaining in the ED until their evaluation is complete.     Catherne Clubs, PA-C 07/02/23 1324

## 2023-07-02 NOTE — Telephone Encounter (Signed)
 Called patients daughter and she told me she has been giving patient 2 tabs BID and patient is still having seizures she just had one 15 min ago. I told daughter that they need to get patient to a ED for further eval since she is not coming back to baseline

## 2023-07-02 NOTE — Telephone Encounter (Signed)
 Pls have her increase the Oxcarbazepine  600mg : Take 1 and 1/2 tablets in AM, 2 tablets in PM. We may add on another medication on her f/u, but for now, increase what she is taking. Thanks

## 2023-07-02 NOTE — Plan of Care (Signed)
 On-call neurology note  Call received from Dr. Randal Bury regarding this patient with increased focal seizures.  On Trileptal  max dose. Recommended adding Keppra -load with 1 g IV and start 500 twice daily. Maintain seizure precautions and follow-up with Dr. Ty Gales in the next week.  Tona Francis, MD

## 2023-07-02 NOTE — Discharge Instructions (Signed)
 Please continue your oxcarbazepine .  We are adding levetiracetam  500 mg twice a day.  Contact Dr. Merita Staples office on Monday for close follow-up.  Return if any worsening or concerning symptoms.

## 2023-07-09 ENCOUNTER — Encounter: Payer: Self-pay | Admitting: Neurology

## 2023-07-09 ENCOUNTER — Ambulatory Visit: Payer: Medicaid Other | Admitting: Neurology

## 2023-07-09 ENCOUNTER — Other Ambulatory Visit

## 2023-07-09 VITALS — BP 101/57 | HR 67 | Wt 154.6 lb

## 2023-07-09 DIAGNOSIS — R8290 Unspecified abnormal findings in urine: Secondary | ICD-10-CM | POA: Diagnosis not present

## 2023-07-09 DIAGNOSIS — G40009 Localization-related (focal) (partial) idiopathic epilepsy and epileptic syndromes with seizures of localized onset, not intractable, without status epilepticus: Secondary | ICD-10-CM

## 2023-07-09 MED ORDER — OXCARBAZEPINE 600 MG PO TABS: ORAL_TABLET | ORAL | 3 refills | Status: DC

## 2023-07-09 MED ORDER — VALTOCO 15 MG DOSE 2 X 7.5 MG/0.1ML NA LQPK: NASAL | 5 refills | Status: DC

## 2023-07-09 MED ORDER — LEVETIRACETAM 500 MG PO TABS: 500.0000 mg | ORAL_TABLET | Freq: Two times a day (BID) | ORAL | 3 refills | Status: DC
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
## 2023-07-09 NOTE — Patient Instructions (Addendum)
 Good to see you.  Urinalysis today  Schedule MRI brain with and without contrast  3. Schedule 1-hour EEG  4. Continue Oxcarbazepine  600mg : Take 1 and 1/2 tablets twice a day  5. Continue Levetiracetam  500mg : take 1 tablet twice a day  6. A prescription for rescue medication Valtoco nasal spray has been sent to give as needed for seizure  7. Please contact PCP about left-sided hearing loss  8. Follow-up on June 30 at 3pm, call for any changes   Seizure Precautions: 1. If medication has been prescribed for you to prevent seizures, take it exactly as directed.  Do not stop taking the medicine without talking to your doctor first, even if you have not had a seizure in a long time.   2. Avoid activities in which a seizure would cause danger to yourself or to others.  Don't operate dangerous machinery, swim alone, or climb in high or dangerous places, such as on ladders, roofs, or girders.  Do not drive unless your doctor says you may.  3. If you have any warning that you may have a seizure, lay down in a safe place where you can't hurt yourself.    4.  No driving for 6 months from last seizure, as per [place]  state law.   Please refer to the following link on the [employer]'s website for more information: http://www.epilepsyfoundation.org/answerplace/Social/driving/drivingu.cfm   5.  Maintain good sleep hygiene.  6.  Notify your neurology if you are planning pregnancy or if you become pregnant.  7.  Contact your doctor if you have any problems that may be related to the medicine you are taking.  8.  Call 911 and bring the patient back to the ED if:        A.  The seizure lasts longer than 5 minutes.       B.  The patient doesn't awaken shortly after the seizure  C.  The patient has new problems such as difficulty seeing, speaking or moving  D.  The patient was injured during the seizure  E.  The patient has a temperature over 102 F (39C)  F.  The patient  vomited and now is having trouble breathing

## 2023-07-10 LAB — URINALYSIS
Bilirubin Urine: NEGATIVE
Glucose, UA: NEGATIVE
Ketones, ur: NEGATIVE
Nitrite: NEGATIVE
Specific Gravity, Urine: 1.017 (ref 1.001–1.035)
pH: 6.5 (ref 5.0–8.0)

## 2023-07-12 ENCOUNTER — Telehealth: Payer: Self-pay

## 2023-07-12 DIAGNOSIS — Z419 Encounter for procedure for purposes other than remedying health state, unspecified: Secondary | ICD-10-CM | POA: Diagnosis not present

## 2023-07-12 NOTE — Telephone Encounter (Signed)
Seen 5/9

## 2023-07-12 NOTE — Telephone Encounter (Signed)
 Pt daughter called informed that PT has UTI and that we will fax results to PCP office

## 2023-07-12 NOTE — Telephone Encounter (Signed)
-----   Message from Jhonny Moss sent at 07/12/2023 10:19 AM EDT ----- Can you pls let her daughter know the urinalysis appears to show an infection, I would like her to have PCP evaluate since we don't treat for UTI. Pls forward results to PCP and have daughter call to f/u, thanks

## 2023-07-14 ENCOUNTER — Other Ambulatory Visit: Payer: Self-pay | Admitting: Internal Medicine

## 2023-07-14 ENCOUNTER — Ambulatory Visit: Payer: Self-pay | Admitting: Internal Medicine

## 2023-07-14 ENCOUNTER — Encounter: Payer: Self-pay | Admitting: Internal Medicine

## 2023-07-14 VITALS — BP 146/78 | HR 65 | Ht 61.0 in | Wt 154.4 lb

## 2023-07-14 DIAGNOSIS — Z1231 Encounter for screening mammogram for malignant neoplasm of breast: Secondary | ICD-10-CM | POA: Diagnosis not present

## 2023-07-14 DIAGNOSIS — Z1211 Encounter for screening for malignant neoplasm of colon: Secondary | ICD-10-CM | POA: Insufficient documentation

## 2023-07-14 DIAGNOSIS — G40909 Epilepsy, unspecified, not intractable, without status epilepticus: Secondary | ICD-10-CM | POA: Diagnosis not present

## 2023-07-14 DIAGNOSIS — N3000 Acute cystitis without hematuria: Secondary | ICD-10-CM | POA: Insufficient documentation

## 2023-07-14 DIAGNOSIS — I10 Essential (primary) hypertension: Secondary | ICD-10-CM | POA: Diagnosis not present

## 2023-07-14 DIAGNOSIS — H9193 Unspecified hearing loss, bilateral: Secondary | ICD-10-CM | POA: Diagnosis not present

## 2023-07-14 MED ORDER — LISINOPRIL 10 MG PO TABS: 10.0000 mg | ORAL_TABLET | Freq: Every day | ORAL | 1 refills | Status: DC

## 2023-07-14 MED ORDER — AMOXICILLIN-POT CLAVULANATE 875-125 MG PO TABS: 1.0000 | ORAL_TABLET | Freq: Two times a day (BID) | ORAL | 0 refills | Status: DC
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
## 2023-07-14 NOTE — Assessment & Plan Note (Signed)
 Recent UA reviewed Due to recent worsening of fatigue, started empiric Augmentin (considering ear pain, Augmentin can provide coverage for otitis media as well) Check urine culture Advised to increase water intake

## 2023-07-14 NOTE — Patient Instructions (Signed)
 Please start taking Augmentin as prescribed for urinary tract infection.  Please start taking Lisinopril  as prescribed for blood pressure.  Please continue to take other medications as prescribed.  Please continue to follow low salt diet and perform moderate exercise/walking at least 150 mins/week.

## 2023-07-14 NOTE — Assessment & Plan Note (Signed)
 BP Readings from Last 1 Encounters:  07/14/23 (!) 146/78   Uncontrolled Used to take Lisinopril -hydrochlorothiazide  20-12.5 mg once daily Restart Lisinopril  at a lower dose - 10 mg QD Counseled for compliance with the medications Advised DASH diet and moderate exercise/walking, at least 150 mins/week

## 2023-07-14 NOTE — Assessment & Plan Note (Signed)
 On Trileptal  and Keppra  Followed by Neurology

## 2023-07-14 NOTE — Assessment & Plan Note (Signed)
 Unclear etiology Ear exam overall benign today Referred to ENT specialist for further evaluation

## 2023-07-16 ENCOUNTER — Ambulatory Visit: Payer: Self-pay | Admitting: Internal Medicine

## 2023-07-16 LAB — URINE CULTURE: Organism ID, Bacteria: NO GROWTH

## 2023-07-27 ENCOUNTER — Encounter: Payer: Self-pay | Admitting: Neurology

## 2023-07-30 ENCOUNTER — Ambulatory Visit
Admission: RE | Admit: 2023-07-30 | Discharge: 2023-07-30 | Disposition: A | Discharge status: Home / Self Care | Source: Ambulatory Visit | Attending: Neurology | Admitting: Neurology

## 2023-07-30 DIAGNOSIS — R9082 White matter disease, unspecified: Secondary | ICD-10-CM | POA: Diagnosis not present

## 2023-07-30 DIAGNOSIS — G40009 Localization-related (focal) (partial) idiopathic epilepsy and epileptic syndromes with seizures of localized onset, not intractable, without status epilepticus: Secondary | ICD-10-CM

## 2023-07-30 MED ORDER — GADOPICLENOL 0.5 MMOL/ML IV SOLN
7.0000 mL | Freq: Once | INTRAVENOUS | Status: AC | PRN
  Administered 2023-07-30: 7 mL via INTRAVENOUS

## 2023-08-03 ENCOUNTER — Encounter (INDEPENDENT_AMBULATORY_CARE_PROVIDER_SITE_OTHER): Payer: Self-pay

## 2023-08-05 ENCOUNTER — Ambulatory Visit: Payer: Self-pay | Admitting: Neurology

## 2023-08-11 NOTE — Telephone Encounter (Signed)
-----   Message from Jhonny Moss sent at 08/05/2023  4:01 PM EDT ----- Pls let daughter know the brain MRI looked fine, no changes from last scan in 2022. No tumor, stroke, bleed, thanks

## 2023-08-11 NOTE — Telephone Encounter (Signed)
 Pt daughter called an informed that  brain MRI looked fine, no changes from last scan in 2022. No tumor, stroke, bleed, pt daughter verbalized understanding

## 2023-08-12 DIAGNOSIS — Z419 Encounter for procedure for purposes other than remedying health state, unspecified: Secondary | ICD-10-CM | POA: Diagnosis not present

## 2023-08-25 ENCOUNTER — Ambulatory Visit: Admitting: Internal Medicine

## 2023-08-30 ENCOUNTER — Encounter: Payer: Self-pay | Admitting: Internal Medicine

## 2023-09-08 ENCOUNTER — Encounter: Payer: Self-pay | Admitting: Internal Medicine

## 2023-09-08 ENCOUNTER — Ambulatory Visit (INDEPENDENT_AMBULATORY_CARE_PROVIDER_SITE_OTHER): Admitting: Internal Medicine

## 2023-09-08 VITALS — BP 128/78 | HR 63 | Ht 61.0 in | Wt 151.4 lb

## 2023-09-08 DIAGNOSIS — Z0001 Encounter for general adult medical examination with abnormal findings: Secondary | ICD-10-CM | POA: Diagnosis not present

## 2023-09-08 DIAGNOSIS — E782 Mixed hyperlipidemia: Secondary | ICD-10-CM

## 2023-09-08 DIAGNOSIS — G40909 Epilepsy, unspecified, not intractable, without status epilepticus: Secondary | ICD-10-CM

## 2023-09-08 DIAGNOSIS — I10 Essential (primary) hypertension: Secondary | ICD-10-CM

## 2023-09-08 DIAGNOSIS — E559 Vitamin D deficiency, unspecified: Secondary | ICD-10-CM

## 2023-09-08 DIAGNOSIS — R739 Hyperglycemia, unspecified: Secondary | ICD-10-CM

## 2023-09-08 MED ORDER — LISINOPRIL 10 MG PO TABS: 10.0000 mg | ORAL_TABLET | Freq: Every day | ORAL | 1 refills | Status: DC

## 2023-09-08 NOTE — Assessment & Plan Note (Signed)
 On Trileptal  and Keppra  Followed by Neurology Check Keppra  level
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
## 2023-09-08 NOTE — Patient Instructions (Signed)
 Please schedule Mammography.  Please submit Cologuard kit.  Please continue to take medications as prescribed.  Please continue to follow low salt diet and perform moderate exercise/walking at least 150 mins/week.

## 2023-09-08 NOTE — Assessment & Plan Note (Signed)
Check lipid profile Advised to follow low-salt and low-cholesterol diet

## 2023-09-08 NOTE — Assessment & Plan Note (Signed)
 Physical exam as documented. Fasting blood tests today. Advised to get Shingrix vaccine, she prefers to think about it.

## 2023-09-08 NOTE — Assessment & Plan Note (Signed)
 BP Readings from Last 1 Encounters:  09/08/23 128/78   Well-controlled with lisinopril  10 mg QD Used to take Lisinopril -hydrochlorothiazide  20-12.5 mg once daily in the past Counseled for compliance with the medications Advised DASH diet and moderate exercise/walking, at least 150 mins/week

## 2023-09-10 ENCOUNTER — Ambulatory Visit: Payer: Self-pay | Admitting: Internal Medicine

## 2023-09-10 LAB — CMP14+EGFR
ALT: 30 IU/L (ref 0–32)
AST: 21 IU/L (ref 0–40)
Albumin: 4.8 g/dL (ref 3.8–4.9)
Alkaline Phosphatase: 138 IU/L — ABNORMAL HIGH (ref 44–121)
BUN/Creatinine Ratio: 17 (ref 9–23)
BUN: 9 mg/dL (ref 6–24)
Bilirubin Total: 0.3 mg/dL (ref 0.0–1.2)
CO2: 23 mmol/L (ref 20–29)
Calcium: 9.8 mg/dL (ref 8.7–10.2)
Chloride: 94 mmol/L — ABNORMAL LOW (ref 96–106)
Creatinine, Ser: 0.53 mg/dL — ABNORMAL LOW (ref 0.57–1.00)
Globulin, Total: 2.7 g/dL (ref 1.5–4.5)
Glucose: 103 mg/dL — ABNORMAL HIGH (ref 70–99)
Potassium: 4.2 mmol/L (ref 3.5–5.2)
Sodium: 135 mmol/L (ref 134–144)
Total Protein: 7.5 g/dL (ref 6.0–8.5)
eGFR: 110 mL/min/1.73 (ref >59)

## 2023-09-10 LAB — CBC WITH DIFFERENTIAL/PLATELET
Basophils Absolute: 0 x10E3/uL (ref 0.0–0.2)
Basos: 1 %
EOS (ABSOLUTE): 0.1 x10E3/uL (ref 0.0–0.4)
Eos: 2 %
Hematocrit: 46.9 % — ABNORMAL HIGH (ref 34.0–46.6)
Hemoglobin: 16.3 g/dL — ABNORMAL HIGH (ref 11.1–15.9)
Immature Grans (Abs): 0 x10E3/uL (ref 0.0–0.1)
Immature Granulocytes: 0 %
Lymphocytes Absolute: 1.4 x10E3/uL (ref 0.7–3.1)
Lymphs: 26 %
MCH: 32.1 pg (ref 26.6–33.0)
MCHC: 34.8 g/dL (ref 31.5–35.7)
MCV: 92 fL (ref 79–97)
Monocytes Absolute: 0.4 x10E3/uL (ref 0.1–0.9)
Monocytes: 7 %
Neutrophils Absolute: 3.6 x10E3/uL (ref 1.4–7.0)
Neutrophils: 64 %
Platelets: 218 x10E3/uL (ref 150–450)
RBC: 5.08 x10E6/uL (ref 3.77–5.28)
RDW: 12.1 % (ref 11.7–15.4)
WBC: 5.5 x10E3/uL (ref 3.4–10.8)

## 2023-09-10 LAB — LIPID PANEL
Chol/HDL Ratio: 4.1 ratio (ref 0.0–4.4)
Cholesterol, Total: 203 mg/dL — ABNORMAL HIGH (ref 100–199)
HDL: 49 mg/dL (ref >39)
LDL Chol Calc (NIH): 121 mg/dL — ABNORMAL HIGH (ref 0–99)
Triglycerides: 187 mg/dL — ABNORMAL HIGH (ref 0–149)
VLDL Cholesterol Cal: 33 mg/dL (ref 5–40)

## 2023-09-10 LAB — TSH: TSH: 0.94 u[IU]/mL (ref 0.450–4.500)

## 2023-09-10 LAB — LEVETIRACETAM LEVEL: Levetiracetam Lvl: <2 ug/mL — ABNORMAL LOW (ref 10.0–40.0)

## 2023-09-10 LAB — VITAMIN D 25 HYDROXY (VIT D DEFICIENCY, FRACTURES): Vit D, 25-Hydroxy: 20.2 ng/mL — ABNORMAL LOW (ref 30.0–100.0)

## 2023-09-10 LAB — HEMOGLOBIN A1C
Est. average glucose Bld gHb Est-mCnc: 108 mg/dL
Hgb A1c MFr Bld: 5.4 % (ref 4.8–5.6)

## 2023-09-10 NOTE — Telephone Encounter (Signed)
 Patient called back for lab results. Lab results were communicated by way of [employer]. Patient in agreement with no questions at this time.

## 2023-09-11 DIAGNOSIS — Z419 Encounter for procedure for purposes other than remedying health state, unspecified: Secondary | ICD-10-CM | POA: Diagnosis not present

## 2023-09-16 ENCOUNTER — Ambulatory Visit (HOSPITAL_COMMUNITY)

## 2023-10-12 DIAGNOSIS — Z419 Encounter for procedure for purposes other than remedying health state, unspecified: Secondary | ICD-10-CM | POA: Diagnosis not present

## 2023-10-22 ENCOUNTER — Telehealth: Payer: Self-pay | Admitting: Neurology

## 2023-10-22 ENCOUNTER — Telehealth: Payer: Self-pay

## 2023-10-22 NOTE — Telephone Encounter (Signed)
 Pt.s daughter called and wants referral to [facility] for 2nd opinion

## 2023-10-22 NOTE — Telephone Encounter (Signed)
**Note De-identified  [place] Obfuscation** Please advise 

## 2023-10-22 NOTE — Telephone Encounter (Signed)
 Copied from CRM #[id number]. Topic: Referral - Question >> Oct 22, 2023  4:17 PM Delon HERO wrote: Reason for CRM: Patient's daughter is calling to transferral care from [facility] to [facility] interested in Dr. Buck, Dr. Ines, Dr. Rosemarie.  Patient's daughter feels that the seizure for the patient are getting worse. Please advise when referral is completed.

## 2023-10-22 NOTE — Telephone Encounter (Signed)
 Did she want to transfer care? Her PCP will need to send the referral.

## 2023-10-22 NOTE — Telephone Encounter (Signed)
 Notified pt daughter--need to contact pt PCP to send the referral to [facility] and voiced understanding.

## 2023-10-25 ENCOUNTER — Other Ambulatory Visit: Payer: Self-pay | Admitting: Internal Medicine

## 2023-10-25 DIAGNOSIS — G40909 Epilepsy, unspecified, not intractable, without status epilepticus: Secondary | ICD-10-CM

## 2023-11-12 DIAGNOSIS — Z419 Encounter for procedure for purposes other than remedying health state, unspecified: Secondary | ICD-10-CM | POA: Diagnosis not present

## 2023-11-16 ENCOUNTER — Encounter (INDEPENDENT_AMBULATORY_CARE_PROVIDER_SITE_OTHER): Payer: Self-pay | Admitting: Otolaryngology

## 2023-11-16 ENCOUNTER — Ambulatory Visit (INDEPENDENT_AMBULATORY_CARE_PROVIDER_SITE_OTHER): Admitting: Otolaryngology

## 2023-11-16 ENCOUNTER — Ambulatory Visit (INDEPENDENT_AMBULATORY_CARE_PROVIDER_SITE_OTHER): Admitting: Audiology

## 2023-11-16 VITALS — BP 166/91 | HR 62

## 2023-11-16 DIAGNOSIS — H903 Sensorineural hearing loss, bilateral: Secondary | ICD-10-CM | POA: Diagnosis not present

## 2023-11-16 DIAGNOSIS — Z0110 Encounter for examination of ears and hearing without abnormal findings: Secondary | ICD-10-CM

## 2023-11-16 DIAGNOSIS — H93293 Other abnormal auditory perceptions, bilateral: Secondary | ICD-10-CM

## 2023-11-16 NOTE — Progress Notes (Unsigned)
  [address] [place], [place] [address] [phone]  Audiological Evaluation    Name: Shelly Houston     DOB:   1970-04-08      MRN:   [id number]                                                                                     Service Date: 11/16/2023     Accompanied by: interpreter   Patient comes today after Dr. Karis, ENT sent a referral for a hearing evaluation due to concerns with hearing loss.  Patient was tested by Spanish speaking audiologist.    Symptoms Yes Details  Hearing loss  [x]  Concerns with hearing loss in both ears  Tinnitus  []    Ear pain/ infections/pressure  []    Balance problems  []    Noise exposure history  []    Previous ear surgeries  []    Family history of hearing loss  []    Amplification  []    Other  [x]  Also reports concerns with seizures    Otoscopy: Right ear: Clear external ear canal and notable landmarks visualized on the tympanic membrane. Left ear:  Clear external ear canal and notable landmarks visualized on the tympanic membrane.  Tympanometry: Right ear: Type A- Normal external ear canal volume with normal middle ear pressure and tympanic membrane compliance. Left ear: Type A- Normal external ear canal volume with normal middle ear pressure and tympanic membrane compliance.   Pure tone Audiometry: Both ears- Normal hearing from 250 Hz - 8000 Hz.  Speech Audiometry: Right ear- Speech Reception Threshold (SRT) was obtained at 20 dBHL. Left ear-Speech Reception Threshold (SRT) was obtained at 20 dBHL.   Word Recognition Score Tested using Spanish list (recorded) Right ear: 100% was obtained at a presentation level of 60 dBHL with contralateral masking which is deemed as  excellent. Left ear: 100% was obtained at a presentation level of 60 dBHL with contralateral masking which is deemed as  excellent.   The hearing test results were completed under headphones and results are deemed to be of good reliability. Test  technique:  conventional    Impression: There is not a significant difference in pure-tone thresholds between ears. There is not a significant difference in the word recognition score in between ears.    Recommendations: Follow up with ENT as scheduled for today. Return for a hearing evaluation if concerns with hearing changes arise or per MD recommendation.   Tonna Palazzi MARIE LEROUX-MARTINEZ, AUD

## 2023-11-17 DIAGNOSIS — H903 Sensorineural hearing loss, bilateral: Secondary | ICD-10-CM | POA: Insufficient documentation

## 2023-11-17 NOTE — Progress Notes (Signed)
 CC: Hearing loss  HPI:  Shelly Houston is a 53 y.o. female who presents today with her interpreter.  According to the patient, she has been having occasional hearing difficulty, especially in noisy environment.  She denies any otalgia, otorrhea, or vertigo.  She has no recent otitis media or otitis externa.  She has no previous ENT surgery.  Past Medical History:  Diagnosis Date   Central cord syndrome (HCC)    History of total abdominal hysterectomy    Primary hypertension 01/23/2020    Past Surgical History:  Procedure Laterality Date   head surgery  2021   TOTAL ABDOMINAL HYSTERECTOMY      Family History  Problem Relation Age of Onset   Diabetes Father    Diabetes Sister     Social History:  reports that she has never smoked. She has never used smokeless tobacco. She reports that she does not currently use alcohol . She reports that she does not use drugs.  Allergies: No Known Allergies  Prior to Admission medications   Medication Sig Start Date End Date Taking? Authorizing Provider  diazePAM , 15 MG Dose, (VALTOCO  15 MG DOSE) 2 x 7.5 MG/0.1ML LQPK Administer one spray in one nostril, second spray in other nostril (one dose) as needed for seizure. May give second dose after 4 hours if needed. 07/09/23  Yes Georjean Darice HERO, MD  levETIRAcetam  (KEPPRA ) 500 MG tablet Take 1 tablet (500 mg total) by mouth 2 (two) times daily. 07/09/23  Yes Georjean Darice HERO, MD  lisinopril  (ZESTRIL ) 10 MG tablet Take 1 tablet (10 mg total) by mouth daily. 09/08/23  Yes Tobie Suzzane POUR, MD  oxcarbazepine  (TRILEPTAL ) 600 MG tablet Take 1 and 1/2 tablets twice a day 07/09/23  Yes Georjean Darice HERO, MD    Blood pressure (!) 166/91, pulse 62, SpO2 98%. Exam: General: Communicates without difficulty, well nourished, no acute distress. Head: Normocephalic, no evidence injury, no tenderness, facial buttresses intact without stepoff. Face/sinus: No tenderness to palpation and percussion. Facial movement is normal  and symmetric. Eyes: PERRL, EOMI. No scleral icterus, conjunctivae clear. Neuro: CN II exam reveals vision grossly intact.  No nystagmus at any point of gaze. Ears: Auricles well formed without lesions.  Ear canals are intact without mass or lesion.  No erythema or edema is appreciated.  The TMs are intact without fluid. Nose: External evaluation reveals normal support and skin without lesions.  Dorsum is intact.  Anterior rhinoscopy reveals normal mucosa over anterior aspect of inferior turbinates and intact septum.  No purulence noted. Oral:  Oral cavity and oropharynx are intact, symmetric, without erythema or edema.  Mucosa is moist without lesions. Neck: Full range of motion without pain.  There is no significant lymphadenopathy.  No masses palpable.  Thyroid  bed within normal limits to palpation.  Parotid glands and submandibular glands equal bilaterally without mass.  Trachea is midline. Neuro:  CN 2-12 grossly intact.   Her hearing test shows minimal bilateral high-frequency sensorineural hearing loss.  Assessment: 1.  Minimal bilateral high-frequency sensorineural hearing loss. 2.  Her ENT exam is otherwise normal.  Plan: 1.  The physical exam findings and the hearing test results are reviewed with the patient. 2.  No acute ENT intervention is needed at this time. 3.  The patient is encouraged to call with any questions or concerns.  [name] 11/17/2023, 10:54 AM

## 2023-11-28 ENCOUNTER — Other Ambulatory Visit: Payer: Self-pay | Admitting: Neurology

## 2023-11-28 DIAGNOSIS — G40009 Localization-related (focal) (partial) idiopathic epilepsy and epileptic syndromes with seizures of localized onset, not intractable, without status epilepticus: Secondary | ICD-10-CM

## 2023-11-30 ENCOUNTER — Telehealth: Payer: Self-pay | Admitting: Neurology

## 2023-11-30 DIAGNOSIS — G40009 Localization-related (focal) (partial) idiopathic epilepsy and epileptic syndromes with seizures of localized onset, not intractable, without status epilepticus: Secondary | ICD-10-CM

## 2023-11-30 MED ORDER — OXCARBAZEPINE 600 MG PO TABS: 1200.0000 mg | ORAL_TABLET | Freq: Two times a day (BID) | ORAL | 0 refills | Status: DC

## 2023-11-30 NOTE — Telephone Encounter (Signed)
 Rx sent until she sees her new neurologist, thanks

## 2023-11-30 NOTE — Telephone Encounter (Signed)
 Left a message with the after hour service on  11-29-23 at 7:02 pm   Caller states her mom is running out of medication but her mom new cannot see her until dec pt is needing a refill  until the new dr can see her.  She needs refill on  Oxcarbazepine  600 mg sent to [facility] [phone].  Advised I will call in a refill once we hung up   Put in the message from after hour service in the Mailbox of Dr Georjean

## 2023-12-08 ENCOUNTER — Ambulatory Visit (INDEPENDENT_AMBULATORY_CARE_PROVIDER_SITE_OTHER): Payer: Self-pay | Admitting: Internal Medicine

## 2023-12-08 ENCOUNTER — Encounter: Payer: Self-pay | Admitting: Internal Medicine

## 2023-12-08 ENCOUNTER — Encounter (INDEPENDENT_AMBULATORY_CARE_PROVIDER_SITE_OTHER): Payer: Self-pay | Admitting: *Deleted

## 2023-12-08 VITALS — BP 136/82 | HR 62 | Ht 61.0 in | Wt 151.2 lb

## 2023-12-08 DIAGNOSIS — I10 Essential (primary) hypertension: Secondary | ICD-10-CM

## 2023-12-08 DIAGNOSIS — Z1211 Encounter for screening for malignant neoplasm of colon: Secondary | ICD-10-CM | POA: Diagnosis not present

## 2023-12-08 DIAGNOSIS — H903 Sensorineural hearing loss, bilateral: Secondary | ICD-10-CM | POA: Diagnosis not present

## 2023-12-08 DIAGNOSIS — G40909 Epilepsy, unspecified, not intractable, without status epilepticus: Secondary | ICD-10-CM

## 2023-12-08 DIAGNOSIS — Z23 Encounter for immunization: Secondary | ICD-10-CM

## 2023-12-08 MED ORDER — LEVETIRACETAM 500 MG PO TABS: 500.0000 mg | ORAL_TABLET | Freq: Two times a day (BID) | ORAL | 3 refills | Status: AC

## 2023-12-08 MED ORDER — VALTOCO 15 MG DOSE 2 X 7.5 MG/0.1ML NA LQPK: NASAL | 5 refills | Status: AC
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
## 2023-12-08 NOTE — Assessment & Plan Note (Addendum)
 BP Readings from Last 1 Encounters:  12/08/23 136/82   Well-controlled with lisinopril  10 mg QD Counseled for compliance with the medications Advised DASH diet and moderate exercise/walking, at least 150 mins/week

## 2023-12-08 NOTE — Assessment & Plan Note (Signed)
 Had ENT evaluation and audiology testing Has difficulty hearing high-pitched sounds ENT specialist advised conservative management for now

## 2023-12-08 NOTE — Assessment & Plan Note (Addendum)
 On Trileptal   Last seizure episode in 09/25 Restart Keppra  500 mg BID Sent Valtoco  for breakthrough seizure Followed by Neurology Check Keppra  level in the next visit

## 2023-12-08 NOTE — Assessment & Plan Note (Signed)
 Agrees to get colonoscopy now Referred to GI

## 2023-12-08 NOTE — Patient Instructions (Addendum)
 Please schedule Mammogram.  Please start taking Levetiracetam  twice daily as prescribed.  Please take Diazepam  as prescribed for seizure episode.  Please continue taking Lisinopril  for now.

## 2023-12-21 DIAGNOSIS — S0101XA Laceration without foreign body of scalp, initial encounter: Secondary | ICD-10-CM | POA: Diagnosis not present

## 2023-12-21 DIAGNOSIS — S0990XA Unspecified injury of head, initial encounter: Secondary | ICD-10-CM | POA: Diagnosis not present

## 2023-12-21 DIAGNOSIS — W228XXA Striking against or struck by other objects, initial encounter: Secondary | ICD-10-CM | POA: Diagnosis not present

## 2023-12-22 ENCOUNTER — Ambulatory Visit (HOSPITAL_COMMUNITY)

## 2024-02-08 ENCOUNTER — Ambulatory Visit: Admitting: Internal Medicine

## 2024-02-09 ENCOUNTER — Ambulatory Visit: Admitting: Neurology

## 2024-02-09 ENCOUNTER — Encounter: Payer: Self-pay | Admitting: Neurology

## 2024-02-11 DIAGNOSIS — Z419 Encounter for procedure for purposes other than remedying health state, unspecified: Secondary | ICD-10-CM | POA: Diagnosis not present

## 2024-03-12 ENCOUNTER — Other Ambulatory Visit: Payer: Self-pay | Admitting: Internal Medicine

## 2024-03-12 DIAGNOSIS — I10 Essential (primary) hypertension: Secondary | ICD-10-CM

## 2024-04-04 ENCOUNTER — Other Ambulatory Visit: Payer: Self-pay | Admitting: Internal Medicine

## 2024-04-04 ENCOUNTER — Ambulatory Visit: Admitting: Internal Medicine

## 2024-04-04 ENCOUNTER — Encounter: Payer: Self-pay | Admitting: Internal Medicine

## 2024-04-04 VITALS — BP 132/70 | HR 65 | Ht 61.0 in | Wt 146.0 lb

## 2024-04-04 DIAGNOSIS — Z23 Encounter for immunization: Secondary | ICD-10-CM

## 2024-04-04 DIAGNOSIS — G40009 Localization-related (focal) (partial) idiopathic epilepsy and epileptic syndromes with seizures of localized onset, not intractable, without status epilepticus: Secondary | ICD-10-CM

## 2024-04-04 DIAGNOSIS — I10 Essential (primary) hypertension: Secondary | ICD-10-CM

## 2024-04-04 DIAGNOSIS — G40909 Epilepsy, unspecified, not intractable, without status epilepticus: Secondary | ICD-10-CM

## 2024-04-04 MED ORDER — OXCARBAZEPINE 600 MG PO TABS: 1200.0000 mg | ORAL_TABLET | Freq: Two times a day (BID) | ORAL | 0 refills | Status: AC

## 2024-04-04 NOTE — Telephone Encounter (Signed)
 Copied from CRM #[id number]. Topic: Clinical - Medication Refill >> Apr 04, 2024  4:23 PM Shereese L wrote: Medication: oxcarbazepine  (TRILEPTAL ) 600 MG tablet  Has the patient contacted their pharmacy? Yes (Agent: If no, request that the patient contact the pharmacy for the refill. If patient does not wish to contact the pharmacy document the reason why and proceed with request.) (Agent: If yes, when and what did the pharmacy advise?)  This is the patient's preferred pharmacy:  [id number] - [place], [place] - [address] [place] [address] Phone: [phone] Fax: [phone]   Is this the correct pharmacy for this prescription? Yes If no, delete pharmacy and type the correct one.   Has the prescription been filled recently? Yes  Is the patient out of the medication? Yes  Has the patient been seen for an appointment in the last year OR does the patient have an upcoming appointment? Yes  Can we respond through MyChart? Yes  Agent: Please be advised that Rx refills may take up to 3 business days. We ask that you follow-up with your pharmacy.

## 2024-04-04 NOTE — Patient Instructions (Signed)
Please continue to take medications as prescribed.  Please continue to follow low salt diet and perform moderate exercise/walking at least 150 mins/week. 

## 2024-06-07 ENCOUNTER — Ambulatory Visit: Admitting: Neurology

## 2024-10-02 ENCOUNTER — Ambulatory Visit: Payer: Self-pay | Admitting: Internal Medicine
